# Patient Record
Sex: Female | Born: 1960
Health system: Southern US, Community
[De-identification: ages and names within clinical notes are randomized; demographics above are authoritative.]

## PROBLEM LIST (undated history)

## (undated) DIAGNOSIS — E119 Type 2 diabetes mellitus without complications: Secondary | ICD-10-CM

## (undated) DIAGNOSIS — J189 Pneumonia, unspecified organism: Secondary | ICD-10-CM

## (undated) DIAGNOSIS — D509 Iron deficiency anemia, unspecified: Secondary | ICD-10-CM

## (undated) DIAGNOSIS — G2581 Restless legs syndrome: Secondary | ICD-10-CM

## (undated) DIAGNOSIS — Z87442 Personal history of urinary calculi: Secondary | ICD-10-CM

## (undated) DIAGNOSIS — E039 Hypothyroidism, unspecified: Secondary | ICD-10-CM

## (undated) DIAGNOSIS — Z8719 Personal history of other diseases of the digestive system: Secondary | ICD-10-CM

## (undated) DIAGNOSIS — G4700 Insomnia, unspecified: Secondary | ICD-10-CM

## (undated) DIAGNOSIS — N261 Atrophy of kidney (terminal): Secondary | ICD-10-CM

## (undated) DIAGNOSIS — G4730 Sleep apnea, unspecified: Secondary | ICD-10-CM

## (undated) DIAGNOSIS — M1990 Unspecified osteoarthritis, unspecified site: Secondary | ICD-10-CM

## (undated) DIAGNOSIS — E785 Hyperlipidemia, unspecified: Secondary | ICD-10-CM

## (undated) DIAGNOSIS — D500 Iron deficiency anemia secondary to blood loss (chronic): Secondary | ICD-10-CM

## (undated) HISTORY — PX: MANDIBLE FRACTURE SURGERY: SHX706

## (undated) HISTORY — PX: REFRACTIVE SURGERY: SHX103

## (undated) HISTORY — DX: Iron deficiency anemia, unspecified: D50.9

## (undated) HISTORY — PX: COLONOSCOPY: SHX174

## (undated) HISTORY — DX: Hypothyroidism, unspecified: E03.9

## (undated) HISTORY — DX: Iron deficiency anemia secondary to blood loss (chronic): D50.0

## (undated) HISTORY — DX: Restless legs syndrome: G25.81

## (undated) HISTORY — DX: Atrophy of kidney (terminal): N26.1

## (undated) HISTORY — DX: Insomnia, unspecified: G47.00

## (undated) HISTORY — PX: LASIK: SHX215

## (undated) HISTORY — PX: KIDNEY STONE SURGERY: SHX686

## (undated) HISTORY — DX: Personal history of urinary calculi: Z87.442

## (undated) HISTORY — DX: Hyperlipidemia, unspecified: E78.5

## (undated) HISTORY — PX: ESOPHAGOGASTRODUODENOSCOPY: SHX1529

---

## 1997-12-01 ENCOUNTER — Inpatient Hospital Stay (HOSPITAL_COMMUNITY): Admission: AD | Admit: 1997-12-01 | Discharge: 1997-12-01 | Payer: Self-pay | Admitting: *Deleted

## 1997-12-23 ENCOUNTER — Inpatient Hospital Stay (HOSPITAL_COMMUNITY): Admission: AD | Admit: 1997-12-23 | Discharge: 1997-12-24 | Payer: Self-pay | Admitting: *Deleted

## 1998-02-11 ENCOUNTER — Other Ambulatory Visit: Admission: RE | Admit: 1998-02-11 | Discharge: 1998-02-11 | Payer: Self-pay | Admitting: Obstetrics and Gynecology

## 1999-10-16 HISTORY — PX: TUBAL LIGATION: SHX77

## 1999-11-22 ENCOUNTER — Other Ambulatory Visit: Admission: RE | Admit: 1999-11-22 | Discharge: 1999-11-22 | Payer: Self-pay | Admitting: Obstetrics and Gynecology

## 2000-05-21 ENCOUNTER — Inpatient Hospital Stay (HOSPITAL_COMMUNITY): Admission: AD | Admit: 2000-05-21 | Discharge: 2000-05-23 | Payer: Self-pay | Admitting: Obstetrics and Gynecology

## 2000-05-21 ENCOUNTER — Encounter (INDEPENDENT_AMBULATORY_CARE_PROVIDER_SITE_OTHER): Payer: Self-pay | Admitting: Specialist

## 2000-07-09 ENCOUNTER — Other Ambulatory Visit: Admission: RE | Admit: 2000-07-09 | Discharge: 2000-07-09 | Payer: Self-pay | Admitting: Obstetrics and Gynecology

## 2002-07-20 ENCOUNTER — Other Ambulatory Visit: Admission: RE | Admit: 2002-07-20 | Discharge: 2002-07-20 | Payer: Self-pay | Admitting: Obstetrics and Gynecology

## 2002-10-15 DIAGNOSIS — Z87442 Personal history of urinary calculi: Secondary | ICD-10-CM

## 2002-10-15 HISTORY — DX: Personal history of urinary calculi: Z87.442

## 2002-10-30 ENCOUNTER — Observation Stay (HOSPITAL_COMMUNITY): Admission: AD | Admit: 2002-10-30 | Discharge: 2002-11-01 | Payer: Self-pay | Admitting: Urology

## 2002-10-30 ENCOUNTER — Encounter: Admission: RE | Admit: 2002-10-30 | Discharge: 2002-10-30 | Payer: Self-pay | Admitting: Family Medicine

## 2002-10-30 ENCOUNTER — Encounter: Payer: Self-pay | Admitting: Family Medicine

## 2002-11-10 ENCOUNTER — Ambulatory Visit (HOSPITAL_BASED_OUTPATIENT_CLINIC_OR_DEPARTMENT_OTHER): Admission: RE | Admit: 2002-11-10 | Discharge: 2002-11-10 | Payer: Self-pay | Admitting: Urology

## 2004-09-11 ENCOUNTER — Ambulatory Visit: Payer: Self-pay | Admitting: Internal Medicine

## 2005-07-12 ENCOUNTER — Ambulatory Visit: Payer: Self-pay | Admitting: Internal Medicine

## 2005-08-14 ENCOUNTER — Ambulatory Visit: Payer: Self-pay | Admitting: Internal Medicine

## 2007-07-24 ENCOUNTER — Telehealth: Payer: Self-pay | Admitting: Internal Medicine

## 2007-09-23 ENCOUNTER — Ambulatory Visit: Payer: Self-pay | Admitting: Internal Medicine

## 2007-09-23 DIAGNOSIS — R5381 Other malaise: Secondary | ICD-10-CM | POA: Insufficient documentation

## 2007-09-23 DIAGNOSIS — R5383 Other fatigue: Secondary | ICD-10-CM

## 2007-09-23 DIAGNOSIS — N959 Unspecified menopausal and perimenopausal disorder: Secondary | ICD-10-CM | POA: Insufficient documentation

## 2007-09-23 DIAGNOSIS — E039 Hypothyroidism, unspecified: Secondary | ICD-10-CM | POA: Insufficient documentation

## 2007-09-23 LAB — CONVERTED CEMR LAB
BUN: 14 mg/dL (ref 6–23)
Basophils Absolute: 0 10*3/uL (ref 0.0–0.1)
Basophils Relative: 0.5 % (ref 0.0–1.0)
CO2: 28 meq/L (ref 19–32)
Calcium: 9.4 mg/dL (ref 8.4–10.5)
Chloride: 103 meq/L (ref 96–112)
Creatinine, Ser: 1.2 mg/dL (ref 0.4–1.2)
Eosinophils Absolute: 0 10*3/uL (ref 0.0–0.6)
Eosinophils Relative: 1.1 % (ref 0.0–5.0)
GFR calc Af Amer: 62 mL/min
GFR calc non Af Amer: 52 mL/min
Glucose, Bld: 104 mg/dL — ABNORMAL HIGH (ref 70–99)
HCT: 38 % (ref 36.0–46.0)
Hemoglobin: 13.2 g/dL (ref 12.0–15.0)
Lymphocytes Relative: 30.6 % (ref 12.0–46.0)
MCHC: 34.8 g/dL (ref 30.0–36.0)
MCV: 84.1 fL (ref 78.0–100.0)
Monocytes Absolute: 0.3 10*3/uL (ref 0.2–0.7)
Monocytes Relative: 7.6 % (ref 3.0–11.0)
Neutro Abs: 2.7 10*3/uL (ref 1.4–7.7)
Neutrophils Relative %: 60.2 % (ref 43.0–77.0)
Platelets: 220 10*3/uL (ref 150–400)
Potassium: 4.4 meq/L (ref 3.5–5.1)
RBC: 4.52 M/uL (ref 3.87–5.11)
RDW: 13.3 % (ref 11.5–14.6)
Sodium: 138 meq/L (ref 135–145)
TSH: 4.36 microintl units/mL (ref 0.35–5.50)
WBC: 4.3 10*3/uL — ABNORMAL LOW (ref 4.5–10.5)

## 2007-09-24 ENCOUNTER — Telehealth: Payer: Self-pay | Admitting: Internal Medicine

## 2007-09-24 ENCOUNTER — Encounter: Payer: Self-pay | Admitting: Internal Medicine

## 2007-10-30 ENCOUNTER — Ambulatory Visit: Payer: Self-pay | Admitting: Internal Medicine

## 2007-10-30 LAB — CONVERTED CEMR LAB
ALT: 38 units/L — ABNORMAL HIGH (ref 0–35)
Albumin: 4.2 g/dL (ref 3.5–5.2)
Alkaline Phosphatase: 131 units/L — ABNORMAL HIGH (ref 39–117)
BUN: 12 mg/dL (ref 6–23)
Basophils Absolute: 0 10*3/uL (ref 0.0–0.1)
Blood in Urine, dipstick: NEGATIVE
Calcium: 9.8 mg/dL (ref 8.4–10.5)
Cholesterol: 208 mg/dL (ref 0–200)
Eosinophils Absolute: 0.1 10*3/uL (ref 0.0–0.6)
GFR calc Af Amer: 77 mL/min
GFR calc non Af Amer: 63 mL/min
Glucose, Urine, Semiquant: NEGATIVE
HDL: 44.6 mg/dL (ref >39.0)
Hemoglobin: 14.2 g/dL (ref 12.0–15.0)
Lymphocytes Relative: 23.9 % (ref 12.0–46.0)
MCHC: 34.8 g/dL (ref 30.0–36.0)
Monocytes Absolute: 0.3 10*3/uL (ref 0.2–0.7)
Monocytes Relative: 6.7 % (ref 3.0–11.0)
Neutro Abs: 3.4 10*3/uL (ref 1.4–7.7)
Platelets: 206 10*3/uL (ref 150–400)
Potassium: 4.9 meq/L (ref 3.5–5.1)
Protein, U semiquant: NEGATIVE
Specific Gravity, Urine: 1.02
TSH: 1.72 microintl units/mL (ref 0.35–5.50)
Total CHOL/HDL Ratio: 4.7
Total Protein: 7.6 g/dL (ref 6.0–8.3)
Urobilinogen, UA: 0.2
WBC Urine, dipstick: NEGATIVE
pH: 6.5

## 2007-11-07 ENCOUNTER — Ambulatory Visit: Payer: Self-pay | Admitting: Internal Medicine

## 2007-11-07 ENCOUNTER — Encounter: Payer: Self-pay | Admitting: Internal Medicine

## 2007-11-07 ENCOUNTER — Other Ambulatory Visit: Admission: RE | Admit: 2007-11-07 | Discharge: 2007-11-07 | Payer: Self-pay | Admitting: Internal Medicine

## 2007-11-07 DIAGNOSIS — R945 Abnormal results of liver function studies: Secondary | ICD-10-CM | POA: Insufficient documentation

## 2007-11-07 DIAGNOSIS — G2581 Restless legs syndrome: Secondary | ICD-10-CM

## 2007-11-07 DIAGNOSIS — IMO0001 Reserved for inherently not codable concepts without codable children: Secondary | ICD-10-CM | POA: Insufficient documentation

## 2007-11-07 DIAGNOSIS — E785 Hyperlipidemia, unspecified: Secondary | ICD-10-CM | POA: Insufficient documentation

## 2007-11-07 HISTORY — DX: Restless legs syndrome: G25.81

## 2008-09-15 ENCOUNTER — Telehealth: Payer: Self-pay | Admitting: Internal Medicine

## 2008-10-21 ENCOUNTER — Telehealth (INDEPENDENT_AMBULATORY_CARE_PROVIDER_SITE_OTHER): Payer: Self-pay | Admitting: *Deleted

## 2008-10-21 ENCOUNTER — Ambulatory Visit: Payer: Self-pay | Admitting: Internal Medicine

## 2008-10-21 LAB — CONVERTED CEMR LAB
Bilirubin Urine: NEGATIVE
Blood in Urine, dipstick: NEGATIVE
Glucose, Urine, Semiquant: NEGATIVE
Specific Gravity, Urine: 1.025
WBC Urine, dipstick: NEGATIVE
pH: 6.5

## 2008-10-22 ENCOUNTER — Encounter: Payer: Self-pay | Admitting: Internal Medicine

## 2008-10-22 ENCOUNTER — Telehealth: Payer: Self-pay | Admitting: Internal Medicine

## 2008-10-22 ENCOUNTER — Ambulatory Visit (HOSPITAL_COMMUNITY): Admission: RE | Admit: 2008-10-22 | Discharge: 2008-10-22 | Payer: Self-pay | Admitting: Internal Medicine

## 2008-10-22 DIAGNOSIS — N1330 Unspecified hydronephrosis: Secondary | ICD-10-CM | POA: Insufficient documentation

## 2008-10-27 ENCOUNTER — Encounter: Payer: Self-pay | Admitting: Internal Medicine

## 2009-08-10 ENCOUNTER — Telehealth: Payer: Self-pay | Admitting: Internal Medicine

## 2009-10-27 ENCOUNTER — Encounter (INDEPENDENT_AMBULATORY_CARE_PROVIDER_SITE_OTHER): Payer: Self-pay | Admitting: *Deleted

## 2009-11-15 ENCOUNTER — Ambulatory Visit: Payer: Self-pay | Admitting: Internal Medicine

## 2009-11-15 DIAGNOSIS — E162 Hypoglycemia, unspecified: Secondary | ICD-10-CM | POA: Insufficient documentation

## 2009-11-15 LAB — CONVERTED CEMR LAB
ALT: 23 units/L (ref 0–35)
Chloride: 108 meq/L (ref 96–112)
Creatinine, Ser: 1.1 mg/dL (ref 0.4–1.2)
Free T4: 0.8 ng/dL (ref 0.6–1.6)
Hgb A1c MFr Bld: 5.9 % (ref 4.6–6.5)
Sodium: 142 meq/L (ref 135–145)
T3, Free: 2.5 pg/mL (ref 2.3–4.2)
Total Bilirubin: 0.7 mg/dL (ref 0.3–1.2)
Total Protein: 7.6 g/dL (ref 6.0–8.3)

## 2009-11-21 ENCOUNTER — Telehealth: Payer: Self-pay | Admitting: Internal Medicine

## 2009-11-22 LAB — CONVERTED CEMR LAB: Vit D, 25-Hydroxy: 18 ng/mL — ABNORMAL LOW (ref 30–89)

## 2010-02-15 ENCOUNTER — Ambulatory Visit: Payer: Self-pay | Admitting: Internal Medicine

## 2010-02-15 DIAGNOSIS — E559 Vitamin D deficiency, unspecified: Secondary | ICD-10-CM | POA: Insufficient documentation

## 2010-02-15 LAB — CONVERTED CEMR LAB
ALT: 19 units/L (ref 0–35)
Cholesterol, target level: 200 mg/dL
Direct LDL: 122 mg/dL
HDL: 44.8 mg/dL (ref >39.00)
Total Bilirubin: 0.6 mg/dL (ref 0.3–1.2)
Total Protein: 7 g/dL (ref 6.0–8.3)

## 2010-04-18 ENCOUNTER — Emergency Department (HOSPITAL_COMMUNITY): Admission: EM | Admit: 2010-04-18 | Discharge: 2010-04-19 | Payer: Self-pay | Admitting: Emergency Medicine

## 2010-04-21 ENCOUNTER — Telehealth: Payer: Self-pay | Admitting: Internal Medicine

## 2010-05-17 ENCOUNTER — Ambulatory Visit: Payer: Self-pay | Admitting: Internal Medicine

## 2010-05-17 DIAGNOSIS — R071 Chest pain on breathing: Secondary | ICD-10-CM | POA: Insufficient documentation

## 2010-05-17 DIAGNOSIS — K449 Diaphragmatic hernia without obstruction or gangrene: Secondary | ICD-10-CM | POA: Insufficient documentation

## 2010-05-17 LAB — CONVERTED CEMR LAB
ALT: 21 units/L (ref 0–35)
AST: 24 units/L (ref 0–37)
Albumin: 4 g/dL (ref 3.5–5.2)
BUN: 19 mg/dL (ref 6–23)
Chloride: 106 meq/L (ref 96–112)
Cholesterol: 194 mg/dL (ref 0–200)
Glucose, Bld: 96 mg/dL (ref 70–99)
Potassium: 4.4 meq/L (ref 3.5–5.1)

## 2010-09-14 ENCOUNTER — Ambulatory Visit: Payer: Self-pay | Admitting: Internal Medicine

## 2010-09-14 DIAGNOSIS — J209 Acute bronchitis, unspecified: Secondary | ICD-10-CM | POA: Insufficient documentation

## 2010-11-14 NOTE — Progress Notes (Signed)
Summary: lab results  Phone Note Call from Patient Call back at Work Phone [phone]   Caller: Patient Call For: Stacie Glaze MD Summary of Call: pt needs lab results Initial call taken by: Heron Sabins,  November 21, 2009 11:44 AM  Follow-up for Phone Call        all labs normal except for lwo vitamin d- and dr Lovell Sheehan has already rx her with vitamind 50,000 every week Follow-up by: Willy Eddy, LPN,  November 21, 2009 12:40 PM

## 2010-11-14 NOTE — Letter (Signed)
Summary: Colonoscopy Date Change Letter  [facility]  [address] [place], [place] [address]   Phone: [phone]  Fax: [phone]      October 27, 2009 MRN: [id number]   [facility] [address] [place], [place]  [address]   Dear Ms. Puskarich,   Previously you were recommended to have a repeat colonoscopy around this time. Your chart was recently reviewed by Dr. Claudette Head of [facility]. Follow up colonoscopy is now recommended in December 2012. This revised recommendation is based on current, nationally recognized guidelines for colorectal cancer screening and polyp surveillance. These guidelines are endorsed by the American Cancer Society, [employer] on Colorectal Cancer as well as numerous other major medical organizations.  Please understand that our recommendation assumes that you do not have any new symptoms such as bleeding, a change in bowel habits, anemia, or significant abdominal discomfort. If you do have any concerning GI symptoms or want to discuss the guideline recommendations, please call to arrange an office visit at your earliest convenience. Otherwise we will keep you in our reminder system and contact you 1-2 months prior to the date listed above to schedule your next colonoscopy.  Thank you,  Judie Petit T. Russella Dar, M.D.  [facility] [phone]

## 2010-11-14 NOTE — Assessment & Plan Note (Signed)
Summary: 3 MO ROV/MM   Vital Signs:  Patient profile:   50 year old female Height:      66 inches Weight:      229 pounds BMI:     37.10 Temp:     98.2 degrees F oral Pulse rate:   76 / minute Resp:     14 per minute BP sitting:   130 / 80  (left arm) Cuff size:   large  Vitals Entered By: Willy Eddy, LPN (Feb 15, 1190 10:03 AM) CC: roa- no vitamind d level since february-requesting refill, Lipid Management   CC:  roa- no vitamind d level since february-requesting refill and Lipid Management.  History of Present Illness: reveiwed the vit d and the thyroid as well as the DM screening labs fatigue is improved and the pt notes that he vitamin d  has improved he fatigue she has not lost weight asn has not loked at the dash diet  Lipid Management History:      Negative NCEP/ATP III risk factors include female age less than 14 years old and non-tobacco-user status.     Preventive Screening-Counseling & Management  Alcohol-Tobacco     Smoking Status: never  Problems Prior to Update: 1)  Hypoglycemia  (ICD-251.2) 2)  Hydronephrosis, Right  (ICD-591) 3)  Hyperlipidemia  (ICD-272.4) 4)  Liver Function Tests, Abnormal  (ICD-794.8) 5)  Myofascial Pain Syndrome  (ICD-729.1) 6)  Restless Leg Syndrome, Mild  (ICD-333.94) 7)  Physical Examination  (ICD-V70.0) 8)  Disorder, Menopausal Nos  (ICD-627.9) 9)  Malaise and Fatigue  (ICD-780.79) 10)  Nephrectomy, Hx of  (ICD-V45.73) 11)  Hypothyroidism  (ICD-244.9) 12)  Family History Diabetes 1st Degree Relative  (ICD-V18.0)  Current Problems (verified): 1)  Hypoglycemia  (ICD-251.2) 2)  Hydronephrosis, Right  (ICD-591) 3)  Hyperlipidemia  (ICD-272.4) 4)  Liver Function Tests, Abnormal  (ICD-794.8) 5)  Myofascial Pain Syndrome  (ICD-729.1) 6)  Restless Leg Syndrome, Mild  (ICD-333.94) 7)  Physical Examination  (ICD-V70.0) 8)  Disorder, Menopausal Nos  (ICD-627.9) 9)  Malaise and Fatigue  (ICD-780.79) 10)  Nephrectomy, Hx of   (ICD-V45.73) 11)  Hypothyroidism  (ICD-244.9) 12)  Family History Diabetes 1st Degree Relative  (ICD-V18.0)  Medications Prior to Update: 1)  Levothroid 112 Mcg  Tabs (Levothyroxine Sodium) .... One By Mouth Daily 2)  Vitamin D (Ergocalciferol) 50000 Unit Caps (Ergocalciferol) .... One By Mouth Weekly  Current Medications (verified): 1)  Levothroid 112 Mcg  Tabs (Levothyroxine Sodium) .... One By Mouth Daily 2)  Vitamin D (Ergocalciferol) 50000 Unit Caps (Ergocalciferol) .... One By Mouth Weekly  Allergies (verified): No Known Drug Allergies  Past History:  Family History: Last updated: 04/30/2007 Family History Diabetes 1st degree relative Family History Hypertension Fam hx renal failure  Social History: Last updated: 04/30/2007 Never Smoked  Risk Factors: Smoking Status: never (02/15/2010)  Past medical, surgical, family and social histories (including risk factors) reviewed, and no changes noted (except as noted below).  Past Medical History: Reviewed history from 11/07/2007 and no changes required. Hypothyroidism insomnia Hyperlipidemia  Past Surgical History: Reviewed history from 09/23/2007 and no changes required. Nephrectomy  Family History: Reviewed history from 04/30/2007 and no changes required. Family History Diabetes 1st degree relative Family History Hypertension Fam hx renal failure  Social History: Reviewed history from 04/30/2007 and no changes required. Never Smoked  Review of Systems       The patient complains of weight gain.  The patient denies anorexia, fever, weight loss, vision loss,  decreased hearing, hoarseness, chest pain, syncope, dyspnea on exertion, peripheral edema, prolonged cough, headaches, hemoptysis, abdominal pain, melena, hematochezia, severe indigestion/heartburn, hematuria, incontinence, genital sores, muscle weakness, suspicious skin lesions, transient blindness, difficulty walking, depression, unusual weight change,  abnormal bleeding, enlarged lymph nodes, angioedema, and breast masses.    Physical Exam  General:  alert, well-developed, and overweight-appearing.   Eyes:  pupils equal and pupils round.   Ears:  R ear normal and L ear normal.   Nose:  no external deformity and no nasal discharge.   Neck:  No deformities, masses, or tenderness noted. Lungs:  normal respiratory effort and no intercostal retractions.   Heart:  normal rate and regular rhythm.   Abdomen:  soft and non-tender.     Impression & Recommendations:  Problem # 1:  HYPOTHYROIDISM (ICD-244.9)  Her updated medication list for this problem includes:    Levothroid 112 Mcg Tabs (Levothyroxine sodium) ..... One by mouth daily  Labs Reviewed: TSH: 1.69 (11/15/2009)    HgBA1c: 5.9 (11/15/2009) Chol: 208 (10/30/2007)   HDL: 44.6 (10/30/2007)   LDL: DEL (10/30/2007)   TG: 106 (10/30/2007)  Orders: Venipuncture ([address]403)  Problem # 2:  UNSPECIFIED VITAMIN D DEFICIENCY (ICD-268.9)  last level was 18 and started the 50,000 iu so if the level has risen appro. will  dose 2,000 international units daily if not then keep on the 50,000 for an additional 3 months Orders: Venipuncture (47425) T-Vitamin D (25-Hydroxy) (95638-75643)  Problem # 3:  HYPOGLYCEMIA (ICD-251.2) monitering prediabetes  Problem # 4:  LIVER FUNCTION TESTS, ABNORMAL (ICD-794.8) monitering with the vit d Orders: TLB-Hepatic/Liver Function Pnl (80076-HEPATIC)  Advised patient to avoid alcohol and Tylenol. Call for worsening symptoms.   Problem # 5:  HYPERLIPIDEMIA, BORDERLINE (ICD-272.4)  Labs Reviewed: SGOT: 22 (11/15/2009)   SGPT: 23 (11/15/2009)  Lipid Goals: Chol Goal: 200 (02/15/2010)   HDL Goal: 40 (02/15/2010)   LDL Goal: 160 (02/15/2010)   TG Goal: 150 (02/15/2010)  10 Yr Risk Heart Disease: Not enough information   HDL:44.6 (10/30/2007)  LDL:DEL (10/30/2007)  Chol:208 (10/30/2007)  Trig:106 (10/30/2007)  Orders: TLB-Cholesterol, HDL  (83718-HDL) TLB-Cholesterol, Direct LDL (83721-DIRLDL) TLB-Cholesterol, Total (82465-CHO)  Complete Medication List: 1)  Levothroid 112 Mcg Tabs (Levothyroxine sodium) .... One by mouth daily 2)  Vitamin D (ergocalciferol) 50000 Unit Caps (Ergocalciferol) .... One by mouth weekly  Lipid Assessment/Plan:      Based on NCEP/ATP III, the patient's risk factor category is "0-1 risk factors".  The patient's lipid goals are as follows: Total cholesterol goal is 200; LDL cholesterol goal is 160; HDL cholesterol goal is 40; Triglyceride goal is 150.  Her LDL cholesterol goal has been met.    Patient Instructions: 1)  Please schedule a follow-up appointment in 3 months. 2)  look at the dash diet Prescriptions: LEVOTHROID 112 MCG  TABS (LEVOTHYROXINE SODIUM) one by mouth daily  #30 Each x 6   Entered and Authorized by:   Stacie Glaze MD   Signed by:   Stacie Glaze MD on 02/15/2010   Method used:   Electronically to        [facility] (retail)       [address]       [place], [place]  [address]       Ph: [phone]       Fax: [phone]   RxID:   [id number]

## 2010-11-14 NOTE — Assessment & Plan Note (Signed)
Summary: tiredness-will fast-check thyroid//ccm   Vital Signs:  Patient profile:   50 year old female Height:      66 inches Weight:      229 pounds BMI:     37.10 Temp:     98.2 degrees F oral Pulse rate:   72 / minute Resp:     14 per minute BP sitting:   124 / 84  (left arm)  Vitals Entered By: Willy Eddy, LPN (November 15, 2009 10:18 AM) CC: c/o fatigue and sleepiness(falling asllep easily) wants thyroid checked--c/o restless leg- 50,000 units vitamin d helped that but she is out of that now Comments pt is out of vitamind 50,00 units but would like to start back- it helped restless leg   CC:  c/o fatigue and sleepiness(falling asllep easily) wants thyroid checked--c/o restless leg- 50 and 000 units vitamin d helped that but she is out of that now.  History of Present Illness: Feels run down has a hx of DM in family ( mother and father) and has a hx of hypothyroidism  Preventive Screening-Counseling & Management  Alcohol-Tobacco     Smoking Status: never  Problems Prior to Update: 1)  Hydronephrosis, Right  (ICD-591) 2)  Hyperlipidemia  (ICD-272.4) 3)  Liver Function Tests, Abnormal  (ICD-794.8) 4)  Myofascial Pain Syndrome  (ICD-729.1) 5)  Restless Leg Syndrome, Mild  (ICD-333.94) 6)  Physical Examination  (ICD-V70.0) 7)  Disorder, Menopausal Nos  (ICD-627.9) 8)  Malaise and Fatigue  (ICD-780.79) 9)  Nephrectomy, Hx of  (ICD-V45.73) 10)  Hypothyroidism  (ICD-244.9) 11)  Family History Diabetes 1st Degree Relative  (ICD-V18.0)  Current Problems (verified): 1)  Hydronephrosis, Right  (ICD-591) 2)  Hyperlipidemia  (ICD-272.4) 3)  Liver Function Tests, Abnormal  (ICD-794.8) 4)  Myofascial Pain Syndrome  (ICD-729.1) 5)  Restless Leg Syndrome, Mild  (ICD-333.94) 6)  Physical Examination  (ICD-V70.0) 7)  Disorder, Menopausal Nos  (ICD-627.9) 8)  Malaise and Fatigue  (ICD-780.79) 9)  Nephrectomy, Hx of  (ICD-V45.73) 10)  Hypothyroidism  (ICD-244.9) 11)   Family History Diabetes 1st Degree Relative  (ICD-V18.0)  Medications Prior to Update: 1)  Levothroid 112 Mcg  Tabs (Levothyroxine Sodium) .... One By Mouth Daily 2)  Vitamin D 09811 Unit  Caps (Ergocalciferol) .... One By Mouth Weekly 3)  Mirapex 0.5 Mg  Tabs (Pramipexole Dihydrochloride) .... One By Mouth At Bedtime 4)  Zithromax Z-Pak 250 Mg Tabs (Azithromycin) .... Use As Directed 5)  Atuss Ds 30-4-30 Mg/25ml Susp (Pseudoephed Hcl-Cpm-Dm Hbr Tan) .... 2 Tsp Every 12 Hours 6)  Cipro 500 Mg Tabs (Ciprofloxacin Hcl) .... One By Mouth Daily  Current Medications (verified): 1)  Levothroid 112 Mcg  Tabs (Levothyroxine Sodium) .... One By Mouth Daily 2)  Vitamin D (Ergocalciferol) 50000 Unit Caps (Ergocalciferol) .... One By Mouth Weekly  Allergies (verified): No Known Drug Allergies  Past History:  Family History: Last updated: 04/30/2007 Family History Diabetes 1st degree relative Family History Hypertension Fam hx renal failure  Social History: Last updated: 04/30/2007 Never Smoked  Risk Factors: Smoking Status: never (11/15/2009)  Past medical, surgical, family and social histories (including risk factors) reviewed, and no changes noted (except as noted below).  Past Medical History: Reviewed history from 11/07/2007 and no changes required. Hypothyroidism insomnia Hyperlipidemia  Past Surgical History: Reviewed history from 09/23/2007 and no changes required. Nephrectomy  Family History: Reviewed history from 04/30/2007 and no changes required. Family History Diabetes 1st degree relative Family History Hypertension Fam hx renal failure  Social History: Reviewed history from 04/30/2007 and no changes required. Never Smoked  Review of Systems  The patient denies anorexia, fever, weight loss, weight gain, vision loss, decreased hearing, hoarseness, chest pain, syncope, dyspnea on exertion, peripheral edema, prolonged cough, headaches, hemoptysis, abdominal pain,  melena, hematochezia, severe indigestion/heartburn, hematuria, incontinence, genital sores, muscle weakness, suspicious skin lesions, transient blindness, difficulty walking, depression, unusual weight change, abnormal bleeding, enlarged lymph nodes, angioedema, and breast masses.         weakness  Physical Exam  General:  alert, well-developed, and overweight-appearing.   Head:  normocephalic and atraumatic.   Eyes:  pupils equal and pupils round.   Ears:  R ear normal and L ear normal.   Nose:  no external deformity and no nasal discharge.   Neck:  No deformities, masses, or tenderness noted. Lungs:  normal respiratory effort and no intercostal retractions.   Heart:  normal rate and regular rhythm.   Abdomen:  soft and non-tender.   Psych:  Oriented X3 and good eye contact.     Impression & Recommendations:  Problem # 1:  HYPOGLYCEMIA (ICD-251.2)  Orders: Venipuncture (08657) TLB-BMP (Basic Metabolic Panel-BMET) (80048-METABOL) TLB-A1C / Hgb A1C (Glycohemoglobin) (83036-A1C)  Problem # 2:  LIVER FUNCTION TESTS, ABNORMAL (ICD-794.8) fatty liver Orders: TLB-Hepatic/Liver Function Pnl (80076-HEPATIC) weight loss is the key  Problem # 3:  HYPOTHYROIDISM (ICD-244.9) moniering of labs due to increased fatigue Her updated medication list for this problem includes:    Levothroid 112 Mcg Tabs (Levothyroxine sodium) ..... One by mouth daily  Orders: TLB-T4 (Thyrox), Free [phone]) TLB-T3, Free (Triiodothyronine) (84481-T3FREE) TLB-TSH (Thyroid Stimulating Hormone) [phone])  Labs Reviewed: TSH: 1.72 (10/30/2007)    Chol: 208 (10/30/2007)   HDL: 44.6 (10/30/2007)   LDL: DEL (10/30/2007)   TG: 106 (10/30/2007)  Problem # 4:  RESTLESS LEG SYNDROME, MILD (ICD-333.94)  Orders: T-Vitamin D (25-Hydroxy) ([address])  Complete Medication List: 1)  Levothroid 112 Mcg Tabs (Levothyroxine sodium) .... One by mouth daily 2)  Vitamin D (ergocalciferol) 50000 Unit Caps  (Ergocalciferol) .... One by mouth weekly  Patient Instructions: 1)  www.[email or web address].org 2)  complex carbohydrates and lean protein source 3)  snaks 4)  10 almonds, 1 inch square of cheeze 5)  apple wedges dipped in peanut butter ( two wedges) 6)  two peanut butter crarckers 7)  Please schedule a follow-up appointment in 3 months. Prescriptions: VITAMIN D (ERGOCALCIFEROL) 50000 UNIT CAPS (ERGOCALCIFEROL) one by mouth weekly  #5 x 5   Entered and Authorized by:   Stacie Glaze MD   Signed by:   Stacie Glaze MD on 11/15/2009   Method used:   Electronically to        [facility] (retail)       [address]       [place], [place]  [address]       Ph: [phone]       Fax: [phone]   RxID:   [id number]

## 2010-11-14 NOTE — Progress Notes (Signed)
Summary: Pt req lab results from vit d and also had xrays and CT at [facility] ER  Phone Note Call from Patient Call back at Work Phone [phone]   Caller: Patient Summary of Call: Pt called and went to [facility] ER on Tuesday night because pt fell last week,on 04/13/10. Pt had xrays done and CT Scan. Pt is req that Dr. Lovell Sheehan review those asap and advise pt.   Also pt is req lab results from vit d.    Initial call taken by: Lucy Antigua,  April 21, 2010 8:51 AM    Prescriptions: VITAMIN D (ERGOCALCIFEROL) 50000 UNIT CAPS (ERGOCALCIFEROL) one by mouth weekly  #5 x 5   Entered by:   Willy Eddy, LPN   Authorized by:   Stacie Glaze MD   Signed by:   Willy Eddy, LPN on 60/73/7106   Method used:   Electronically to        [facility] (retail)       [address]       [place], [place]  [address]       Ph: [phone]       Fax: [phone]   RxID:   [id number]   Appended Document: Pt req lab results from vit d and also had xrays and CT at [facility] ER ct fromo er was normal ptinformed and to continue vitamind 50000 weekly

## 2010-11-14 NOTE — Assessment & Plan Note (Signed)
Summary: 4 month rov/njr   Vital Signs:  Patient profile:   50 year old female Height:      66 inches Weight:      228 pounds BMI:     36.93 Temp:     98.2 degrees F oral Pulse rate:   76 / minute Resp:     14 per minute BP sitting:   136 / 80  (left arm)  Vitals Entered By: Willy Eddy, LPN (September 14, 2010 10:10 AM) CC: roa- c/o croupy cough for  2 weeks, Cough Is Patient Diabetic? No   Primary Care Provider:  Stacie Glaze MD  CC:  roa- c/o croupy cough for  2 weeks and Cough.  History of Present Illness: two weeks of URI with cough and conjestion  Cough      This is a 50 year old woman who presents with Cough.  The patient reports productive cough, shortness of breath, and wheezing, but denies fever.  Associated symtpoms include cold/URI symptoms and chronic rhinitis.  The cough is worse with activity.    Preventive Screening-Counseling & Management  Alcohol-Tobacco     Smoking Status: never  Current Problems (verified): 1)  Hiatal Hernia With Reflux  (ICD-553.3) 2)  Chest Wall Pain, Anterior  (ICD-786.52) 3)  Hyperlipidemia, Borderline  (ICD-272.4) 4)  Unspecified Vitamin D Deficiency  (ICD-268.9) 5)  Hypoglycemia  (ICD-251.2) 6)  Hydronephrosis, Right  (ICD-591) 7)  Hyperlipidemia  (ICD-272.4) 8)  Liver Function Tests, Abnormal  (ICD-794.8) 9)  Myofascial Pain Syndrome  (ICD-729.1) 10)  Restless Leg Syndrome, Mild  (ICD-333.94) 11)  Physical Examination  (ICD-V70.0) 12)  Disorder, Menopausal Nos  (ICD-627.9) 13)  Malaise and Fatigue  (ICD-780.79) 14)  Hypothyroidism  (ICD-244.9) 15)  Family History Diabetes 1st Degree Relative  (ICD-V18.0)  Current Medications (verified): 1)  Levothroid 112 Mcg  Tabs (Levothyroxine Sodium) .... One By Mouth Daily 2)  Vitamin D (Ergocalciferol) 50000 Unit Caps (Ergocalciferol) .... One By Mouth Weekly  Allergies (verified): No Known Drug Allergies  Past History:  Family History: Last updated:  04/30/2007 Family History Diabetes 1st degree relative Family History Hypertension Fam hx renal failure  Social History: Last updated: 04/30/2007 Never Smoked  Risk Factors: Smoking Status: never (09/14/2010)  Past medical, surgical, family and social histories (including risk factors) reviewed, and no changes noted (except as noted below). Past medical, surgical, family and social histories (including risk factors) reviewed for relevance to current acute and chronic problems.  Past Medical History: Reviewed history from 05/17/2010 and no changes required. Hypothyroidism insomnia Hyperlipidemia atrophic left kidney  Past Surgical History: Reviewed history from 09/23/2007 and no changes required. Nephrectomy  Family History: Reviewed history from 04/30/2007 and no changes required. Family History Diabetes 1st degree relative Family History Hypertension Fam hx renal failure  Social History: Reviewed history from 04/30/2007 and no changes required. Never Smoked  Review of Systems       The patient complains of decreased hearing, dyspnea on exertion, prolonged cough, and headaches.  The patient denies anorexia, fever, weight loss, weight gain, vision loss, hoarseness, chest pain, syncope, peripheral edema, hemoptysis, abdominal pain, melena, hematochezia, severe indigestion/heartburn, hematuria, incontinence, genital sores, muscle weakness, suspicious skin lesions, transient blindness, difficulty walking, depression, unusual weight change, abnormal bleeding, enlarged lymph nodes, angioedema, and breast masses.    Physical Exam  General:  alert, well-developed, and overweight-appearing.   Head:  normocephalic and atraumatic.   Eyes:  pupils equal and pupils round.   Ears:  R  ear normal and L ear normal.   Nose:  mucosal erythema and mucosal edema.   Mouth:  pharyngeal erythema and postnasal drip.   Neck:  No deformities, masses, or tenderness noted. Lungs:  normal  respiratory effort and no intercostal retractions.  R wheezes.   Heart:  normal rate and regular rhythm.   Abdomen:  soft and non-tender.     Impression & Recommendations:  Problem # 1:  ACUTE BRONCHITIS (ICD-466.0)  symptoms for 2 weeks  Take antibiotics and other medications as directed. Encouraged to push clear liquids, get enough rest, and take acetaminophen as needed. To be seen in 5-7 days if no improvement, sooner if worse.  Her updated medication list for this problem includes:    Clarithromycin 500 Mg Xr24h-tab (Clarithromycin) ..... One by mouth two times a day for 7 days    Zutripro 60-4-5 Mg/41ml Soln (Pseudoeph-chlorphen-hydrocod) ..... One tsp by mouth q 6 hours  Problem # 2:  HYPERLIPIDEMIA, BORDERLINE (ICD-272.4)  Labs Reviewed: SGOT: 24 (05/17/2010)   SGPT: 21 (05/17/2010)  Lipid Goals: Chol Goal: 200 (02/15/2010)   HDL Goal: 40 (02/15/2010)   LDL Goal: 160 (02/15/2010)   TG Goal: 150 (02/15/2010)  Prior 10 Yr Risk Heart Disease: Not enough information (02/15/2010)   HDL:44.30 (05/17/2010), 44.80 (02/15/2010)  LDL:132 (05/17/2010), DEL (10/30/2007)  Chol:194 (05/17/2010), 178 (02/15/2010)  Trig:88.0 (05/17/2010), 106 (10/30/2007)  Problem # 3:  HYPOTHYROIDISM (ICD-244.9)  Her updated medication list for this problem includes:    Levothroid 112 Mcg Tabs (Levothyroxine sodium) ..... One by mouth daily  Labs Reviewed: TSH: 1.11 (05/17/2010)    HgBA1c: 5.9 (11/15/2009) Chol: 194 (05/17/2010)   HDL: 44.30 (05/17/2010)   LDL: 132 (05/17/2010)   TG: 88.0 (05/17/2010)  Complete Medication List: 1)  Levothroid 112 Mcg Tabs (Levothyroxine sodium) .... One by mouth daily 2)  Vitamin D (ergocalciferol) 50000 Unit Caps (Ergocalciferol) .... One by mouth weekly 3)  Clarithromycin 500 Mg Xr24h-tab (Clarithromycin) .... One by mouth two times a day for 7 days 4)  Zutripro 60-4-5 Mg/69ml Soln (Pseudoeph-chlorphen-hydrocod) .... One tsp by mouth q 6 hours  Patient  Instructions: 1)  Please schedule a follow-up appointment in 6 months.  CPX Prescriptions: ZUTRIPRO 60-4-5 MG/5ML SOLN (PSEUDOEPH-CHLORPHEN-HYDROCOD) one tsp by mouth q 6 hours  #6 oz x 0   Entered and Authorized by:   Stacie Glaze MD   Signed by:   Stacie Glaze MD on 09/14/2010   Method used:   Print then Give to Patient   RxID:   [id number] CLARITHROMYCIN 500 MG XR24H-TAB (CLARITHROMYCIN) one by mouth two times a day for 7 days  #14 x 0   Entered and Authorized by:   Stacie Glaze MD   Signed by:   Stacie Glaze MD on 09/14/2010   Method used:   Electronically to        [facility] (retail)       [address]       [place], [place]  [address]       Ph: [phone]       Fax: [phone]   RxID:   [id number]    Orders Added: 1)  Est. Patient Level IV [[address]]

## 2010-11-14 NOTE — Assessment & Plan Note (Signed)
Summary: 3 month rov/njr   Vital Signs:  Patient profile:   50 year old female Height:      66 inches Weight:      228 pounds BMI:     36.93 Temp:     97.7 degrees F oral Pulse rate:   75 / minute BP sitting:   106 / 74  (left arm) Cuff size:   regular  Vitals Entered By: Kathrynn Speed CMA (May 17, 2010 9:53 AM) CC: 3 mth rov, hospital fu, maybe bruised chest wall on right side,src   CC:  3 mth rov, hospital fu, maybe bruised chest wall on right side, and src.  History of Present Illness: Pt had a fall in early july with a fall on knee and hit chest, the urgent care directed her to the ER she had a CT at [facility] which confirmed the diagnosis of chest wall trauma she has persistant  pain in the ribs weight loss and obesity discussed  Problems Prior to Update: 1)  Hyperlipidemia, Borderline  (ICD-272.4) 2)  Unspecified Vitamin D Deficiency  (ICD-268.9) 3)  Hypoglycemia  (ICD-251.2) 4)  Hydronephrosis, Right  (ICD-591) 5)  Hyperlipidemia  (ICD-272.4) 6)  Liver Function Tests, Abnormal  (ICD-794.8) 7)  Myofascial Pain Syndrome  (ICD-729.1) 8)  Restless Leg Syndrome, Mild  (ICD-333.94) 9)  Physical Examination  (ICD-V70.0) 10)  Disorder, Menopausal Nos  (ICD-627.9) 11)  Malaise and Fatigue  (ICD-780.79) 12)  Nephrectomy, Hx of  (ICD-V45.73) 13)  Hypothyroidism  (ICD-244.9) 14)  Family History Diabetes 1st Degree Relative  (ICD-V18.0)  Medications Prior to Update: 1)  Levothroid 112 Mcg  Tabs (Levothyroxine Sodium) .... One By Mouth Daily 2)  Vitamin D (Ergocalciferol) 50000 Unit Caps (Ergocalciferol) .... One By Mouth Weekly  Current Medications (verified): 1)  Levothroid 112 Mcg  Tabs (Levothyroxine Sodium) .... One By Mouth Daily 2)  Vitamin D (Ergocalciferol) 50000 Unit Caps (Ergocalciferol) .... One By Mouth Weekly  Allergies (verified): No Known Drug Allergies  Past History:  Family History: Last updated: 04/30/2007 Family History Diabetes 1st degree  relative Family History Hypertension Fam hx renal failure  Social History: Last updated: 04/30/2007 Never Smoked  Risk Factors: Smoking Status: never (02/15/2010)  Past medical, surgical, family and social histories (including risk factors) reviewed, and no changes noted (except as noted below).  Past Medical History: Hypothyroidism insomnia Hyperlipidemia atrophic left kidney  Past Surgical History: Reviewed history from 09/23/2007 and no changes required. Nephrectomy  Family History: Reviewed history from 04/30/2007 and no changes required. Family History Diabetes 1st degree relative Family History Hypertension Fam hx renal failure  Social History: Reviewed history from 04/30/2007 and no changes required. Never Smoked  Review of Systems  The patient denies anorexia, fever, weight loss, weight gain, vision loss, decreased hearing, hoarseness, chest pain, syncope, dyspnea on exertion, peripheral edema, prolonged cough, headaches, hemoptysis, abdominal pain, melena, hematochezia, severe indigestion/heartburn, hematuria, incontinence, genital sores, muscle weakness, suspicious skin lesions, transient blindness, difficulty walking, depression, unusual weight change, abnormal bleeding, enlarged lymph nodes, angioedema, breast masses, and testicular masses.         chest walll pain on the right  Physical Exam  General:  alert, well-developed, and overweight-appearing.   Eyes:  pupils equal and pupils round.   Ears:  R ear normal and L ear normal.   Nose:  no external deformity and no nasal discharge.   Neck:  No deformities, masses, or tenderness noted. Chest Wall:  costochondrial tenderness and chest wall mass.  Lungs:  normal respiratory effort and no intercostal retractions.   Heart:  normal rate and regular rhythm.   Abdomen:  soft and non-tender.   Neurologic:  alert & oriented X3 and cranial nerves II-XII intact.     Impression & Recommendations:  Problem # 1:   CHEST WALL PAIN, ANTERIOR (ICD-786.52)  chest wall pain for trauma resolving er recored and ct scans reviewed with pt  Reviewed EKG (see interpretation) and treatment options. Patient instructed to call for worsening pain, or new symptoms.   Problem # 2:  LIVER FUNCTION TESTS, ABNORMAL (ICD-794.8)  monitering today fatty liver working diagnosis  Advised patient to avoid alcohol and Tylenol. Call for worsening symptoms.   Orders: TLB-Hepatic/Liver Function Pnl (80076-HEPATIC)  Problem # 3:  HIATAL HERNIA WITH REFLUX (ICD-553.3)  confirmed on CT  Labs Reviewed: Hgb: 14.2 (10/30/2007)   Hct: 40.8 (10/30/2007)  Problem # 4:  HYPOTHYROIDISM (ICD-244.9)  Her updated medication list for this problem includes:    Levothroid 112 Mcg Tabs (Levothyroxine sodium) ..... One by mouth daily  Labs Reviewed: TSH: 1.69 (11/15/2009)    HgBA1c: 5.9 (11/15/2009) Chol: 178 (02/15/2010)   HDL: 44.80 (02/15/2010)   LDL: DEL (10/30/2007)   TG: 106 (10/30/2007)  Orders: Venipuncture (03474) TLB-TSH (Thyroid Stimulating Hormone) (84443-TSH)  Complete Medication List: 1)  Levothroid 112 Mcg Tabs (Levothyroxine sodium) .... One by mouth daily 2)  Vitamin D (ergocalciferol) 50000 Unit Caps (Ergocalciferol) .... One by mouth weekly  Other Orders: TLB-Lipid Panel (80061-LIPID) TLB-BMP (Basic Metabolic Panel-BMET) (80048-METABOL)  Patient Instructions: 1)  Please schedule a follow-up appointment in 4 months.  Appended Document: Orders Update    Clinical Lists Changes  Orders: Added new Service order of Specimen Handling ([id number]) - Signed

## 2010-12-12 ENCOUNTER — Other Ambulatory Visit: Payer: Self-pay | Admitting: Internal Medicine

## 2010-12-31 LAB — DIFFERENTIAL
Basophils Absolute: 0 10*3/uL (ref 0.0–0.1)
Eosinophils Relative: 4 % (ref 0–5)
Lymphocytes Relative: 37 % (ref 12–46)
Monocytes Absolute: 0.4 10*3/uL (ref 0.1–1.0)
Monocytes Relative: 7 % (ref 3–12)
Neutro Abs: 3.3 10*3/uL (ref 1.7–7.7)

## 2010-12-31 LAB — COMPREHENSIVE METABOLIC PANEL
AST: 28 U/L (ref 0–37)
Albumin: 3.9 g/dL (ref 3.5–5.2)
Chloride: 106 mEq/L (ref 96–112)
Creatinine, Ser: 1 mg/dL (ref 0.4–1.2)
GFR calc Af Amer: >60 mL/min (ref >60)
Potassium: 4.2 mEq/L (ref 3.5–5.1)
Total Bilirubin: 0.7 mg/dL (ref 0.3–1.2)
Total Protein: 7.1 g/dL (ref 6.0–8.3)

## 2010-12-31 LAB — URINALYSIS, ROUTINE W REFLEX MICROSCOPIC
Nitrite: NEGATIVE
Protein, ur: NEGATIVE mg/dL
Specific Gravity, Urine: 1.016 (ref 1.005–1.030)
Urobilinogen, UA: 0.2 mg/dL (ref 0.0–1.0)

## 2010-12-31 LAB — CBC
MCH: 29.9 pg (ref 26.0–34.0)
Platelets: 179 10*3/uL (ref 150–400)
RBC: 4.32 MIL/uL (ref 3.87–5.11)
RDW: 13.6 % (ref 11.5–15.5)
WBC: 6.3 10*3/uL (ref 4.0–10.5)

## 2010-12-31 LAB — PROTIME-INR: Prothrombin Time: 13.3 seconds (ref 11.6–15.2)

## 2011-02-13 ENCOUNTER — Other Ambulatory Visit: Payer: Self-pay | Admitting: Internal Medicine

## 2011-03-02 NOTE — Op Note (Signed)
NAME:  Angelica Gray, Angelica Gray                           ACCOUNT NO.:  [id number]   MEDICAL RECORD NO.:  [id number]                   PATIENT TYPE:  AMB   LOCATION:  [facility]                                 FACILITY:  [facility]   PHYSICIAN:  Ronald L. Ovidio Hanger, M.D.           DATE OF BIRTH:  May 09, 1961   DATE OF PROCEDURE:  11/10/2002  DATE OF DISCHARGE:                                 OPERATIVE REPORT   PREOPERATIVE DIAGNOSES:  1. Right ureterolithiasis with urosepsis.  2. Placement of prior stent.   PROCEDURES:  1. Cystourethroscopy.  2. Removal of right double J stent.  3. Right ureteroscopy with holmium laser lithotripsy and stone basket     extraction.   SURGEON:  Lucrezia Starch. Earlene Plater, M.D.   ANESTHESIA:  General laryngeal airway.   ESTIMATED BLOOD LOSS:  Negligible.   TUBES:  None.   COMPLICATIONS:  None.   INDICATION FOR PROCEDURE:  The patient is a  lovely 50 year old white female  who originally presented with urosepsis and obstructive right  ureterolithiasis.  She underwent placement of a right double J stent and has  subsequently recovered from her urosepsis and is now for cystoscopy and  ureteroscopy and stone removal.  She understands the risks, benefits, and  alternatives and wishes to proceed.  She has been properly preoperatively  prepared.   DESCRIPTION OF PROCEDURE:  The patient was placed in the supine position,  after proper general laryngeal airway anesthesia was placed in the dorsal  lithotomy position, prepped and draped with Betadine in sterile fashion.  Cystourethroscopy was performed with a 22.5 French Olympus panendoscope  utilizing the 12 and 70 degree lenses.  The bladder was carefully inspected  and noted to be without lesions, and the right double J stent could be  visualized with the 12 degree lens.  It was grasped with a grasping forceps  and extracted to the urethral meatus and a 0.038 French Teflon-coated  guidewire was placed into the right renal pelvis  and the stent was removed.  A short, thin ACMI ureteroscope was then utilized, semirigid, and  ureteroscopy was easily performed up to the level of the stone.  It was in  the lower ureter but flushed to the mid-ureter.  It was quite large, and it  was felt that holmium laser lithotripsy was indicated.  Utilizing a 365  fiber, a setting of 0.5 and a repetition rate of 5, the stone was fragmented  into several fragments and these were extracted with a nitinol basket and  submitted to pathology.  Reinspection of  the lower two-thirds ureter revealed only stone debris and no frank  fragments.  It was felt that it was widely patent and no further stent would  be required.  The bladder was drained and all scopes were removed, and the  patient was taken to the recovery room stable.  Ronald L. Ovidio Hanger, M.D.    RLD/MEDQ  D:  11/10/2002  T:  11/10/2002  Job:  [id number]

## 2011-03-02 NOTE — Op Note (Signed)
NAME:  Angelica Gray, Angelica Gray                           ACCOUNT NO.:  [id number]   MEDICAL RECORD NO.:  [id number]                   PATIENT TYPE:  AMB   LOCATION:  DAY                                  FACILITY:  [facility]   PHYSICIAN:  Ronald L. Ovidio Hanger, M.D.           DATE OF BIRTH:  18-Nov-1960   DATE OF PROCEDURE:  10/30/2002  DATE OF DISCHARGE:                                 OPERATIVE REPORT   PREOPERATIVE DIAGNOSES:  1. Solitary functioning right renal unit.  2. Right distal ureterolithiasis with hydronephrosis and impending     urosepsis.   PROCEDURES:  1. Cystourethroscopy with right retrograde ureteropyelogram.  2. Placement of right double J stent.  3. Culture and sensitivity of right renal pelvic fluid.   SURGEON:  Lucrezia Starch. Earlene Plater, M.D.   ANESTHESIA:  General endotracheal.   ESTIMATED BLOOD LOSS:  Negligible.   TUBES:  A 24 cm 6 [place] Surgitek double pigtail stent.   COMPLICATIONS:  None.   INDICATION FOR PROCEDURE:  The patient is a very nice 50 year old white  female who presented with right flank pain, nausea, vomiting, fevers,  sweats, and chills.  She was originally seen by Dr. Raphael Gibney office and was  subsequently sent to see Dr. Wanda Plump, with a CT scan that showed a right  distal calculus and right hydronephrosis in essentially solitary functional  right renal unit.  She had a fever of 102 and after understanding risks,  benefits, and alternatives, elected to proceed with the above procedure.   DESCRIPTION OF PROCEDURE:  The patient was placed in the supine position,  after proper general endotracheal anesthesia was placed in the dorsal  lithotomy position and prepped and draped with Betadine in sterile fashion.  Cystourethroscopy was performed with a 22.5 French Olympus panendoscope  utilizing the 12 and 70 degree lenses.  The bladder was carefully inspected.  There were a few stone fragments in the bladder.  The trigone was somewhat  inflamed, especially the  right ureteral orifice, but no other lesions were  noted.  There was a left trigone with a tiny orifice noted.  Under  fluoroscopic guidance a 0.038 French Teflon-coated guidewire was placed into  the right renal pelvis and a gush of fluid was noted.  A 6 French open-ended  catheter was placed in the right renal pelvis and pelvic fluid was aspirated  and submitted for aerobic and anaerobic culture and sensitivity.  A small  amount of dye was injected.  The system was hydronephrotic.  The wire was  replaced.  Under fluoroscopic guidance a 24 cm, 6 French  Bard double pigtail stent was placed and noted to be in good position within  the right renal pelvis within the bladder, and a hydronephrotic drip was  noted from the stent.  The bladder was removed and the panendoscope was  removed.  The patient was taken to the recovery room in stable condition.  Ronald L. Ovidio Hanger, M.D.    RLD/MEDQ  D:  10/30/2002  T:  10/31/2002  Job:  [id number]

## 2011-03-02 NOTE — Op Note (Signed)
[place]. [facility]  Patient:    Angelica Gray, Angelica Gray                        MRN: [id number] Proc. Date: 05/22/00 Adm. Date:  [id number] Attending:  Frederich Balding                           Operative Report  PREOPERATIVE DIAGNOSIS: Request for permanent sterilization.  POSTOPERATIVE DIAGNOSIS: Request for permanent sterilization.  OPERATION/PROCEDURE: Modified [place] postpartum tubal ligation.  SURGEON: Duke Salvia. Marcelle Overlie, M.D.  ANESTHESIA: General endotracheal.  COMPLICATIONS: None.  DRAINS: In-and-out Foley catheter.  ESTIMATED BLOOD LOSS: Less than 5 cc.  DESCRIPTION OF PROCEDURE: The patient was taken to the operating room and after an adequate level of general endotracheal anesthesia was obtained and with the patient supine the abdomen was prepped with Betadine and draped in the usual manner for sterile abdominal procedure.  The subumbilical area was infiltrated with 1/2% Marcaine plain and a small skin incision made and carried down individually through the fascia and peritoneum.  Army-Navy retractors were positioned.  The right tube was identified, grasped with a Babcock clamp, traced to the fimbriated end for verification, re-grasped at the mid segment, and a 0 plain suture then tied around each end of a mid segment knuckle of tube, which was excised.  The cut ends were cauterized with the Bovie.  A 2-0 silk suture was placed around the proximal segment and this area was hemostatic.  The exact same procedure was repeated on the opposite side after carefully identifying the tube.  These areas were hemostatic. Prior to closure sponge, needle, and instrument counts were reported as correct x 2.  The fascia was closed with running 2-0 Dexon suture and the skin closed with 4-0 Dexon subcuticular.  She went to the recovery room in good condition. DD:  05/22/00 TD:  05/23/00 Job: [id number]

## 2011-03-06 ENCOUNTER — Encounter: Payer: Self-pay | Admitting: Internal Medicine

## 2011-03-09 ENCOUNTER — Other Ambulatory Visit: Payer: Self-pay

## 2011-03-16 ENCOUNTER — Other Ambulatory Visit (HOSPITAL_COMMUNITY)
Admission: RE | Admit: 2011-03-16 | Discharge: 2011-03-16 | Disposition: A | Discharge status: Home / Self Care | Payer: BC Managed Care – PPO | Source: Ambulatory Visit | Attending: Internal Medicine | Admitting: Internal Medicine

## 2011-03-16 ENCOUNTER — Encounter: Payer: Self-pay | Admitting: Internal Medicine

## 2011-03-16 ENCOUNTER — Ambulatory Visit (INDEPENDENT_AMBULATORY_CARE_PROVIDER_SITE_OTHER): Payer: BC Managed Care – PPO | Admitting: Internal Medicine

## 2011-03-16 DIAGNOSIS — R739 Hyperglycemia, unspecified: Secondary | ICD-10-CM

## 2011-03-16 DIAGNOSIS — E559 Vitamin D deficiency, unspecified: Secondary | ICD-10-CM

## 2011-03-16 DIAGNOSIS — Z0000 Encounter for general adult medical examination without abnormal findings: Secondary | ICD-10-CM

## 2011-03-16 DIAGNOSIS — Z01419 Encounter for gynecological examination (general) (routine) without abnormal findings: Secondary | ICD-10-CM | POA: Insufficient documentation

## 2011-03-16 DIAGNOSIS — E039 Hypothyroidism, unspecified: Secondary | ICD-10-CM

## 2011-03-16 DIAGNOSIS — R945 Abnormal results of liver function studies: Secondary | ICD-10-CM

## 2011-03-16 DIAGNOSIS — R7309 Other abnormal glucose: Secondary | ICD-10-CM

## 2011-03-16 DIAGNOSIS — Z124 Encounter for screening for malignant neoplasm of cervix: Secondary | ICD-10-CM

## 2011-03-16 LAB — HEPATIC FUNCTION PANEL
ALT: 25 U/L (ref 0–35)
Albumin: 4 g/dL (ref 3.5–5.2)
Total Protein: 7 g/dL (ref 6.0–8.3)

## 2011-03-16 LAB — CBC WITH DIFFERENTIAL/PLATELET
Basophils Absolute: 0 10*3/uL (ref 0.0–0.1)
Eosinophils Absolute: 0.1 10*3/uL (ref 0.0–0.7)
HCT: 37.4 % (ref 36.0–46.0)
Hemoglobin: 12.7 g/dL (ref 12.0–15.0)
Lymphs Abs: 1.6 10*3/uL (ref 0.7–4.0)
MCHC: 33.9 g/dL (ref 30.0–36.0)
MCV: 86.5 fl (ref 78.0–100.0)
Monocytes Absolute: 0.5 10*3/uL (ref 0.1–1.0)
Monocytes Relative: 9 % (ref 3.0–12.0)
Neutro Abs: 3.2 10*3/uL (ref 1.4–7.7)
RDW: 13.9 % (ref 11.5–14.6)

## 2011-03-16 LAB — BASIC METABOLIC PANEL
BUN: 20 mg/dL (ref 6–23)
CO2: 26 mEq/L (ref 19–32)
Calcium: 9.1 mg/dL (ref 8.4–10.5)
Chloride: 106 mEq/L (ref 96–112)
Creatinine, Ser: 1 mg/dL (ref 0.4–1.2)

## 2011-03-16 LAB — LIPID PANEL
Cholesterol: 174 mg/dL (ref 0–200)
HDL: 55.5 mg/dL (ref >39.00)
Triglycerides: 79 mg/dL (ref 0.0–149.0)

## 2011-03-16 NOTE — Progress Notes (Signed)
Subjective:    Patient ID: Angelica Gray, female    DOB: [birth year], 50 y.o.   MRN: [id number]  HPI patient presents for complete physical examination.  She has chronic problems it would follow also of hypothyroidism a history of hypo-and hyperglycemia with strong family history of diabetes vitamin D deficiency and history of restless leg syndrome.  She states that she has been doing reasonably well she has gained weight and this increases her risk factors for diabetes    Review of Systems  Constitutional: Negative for activity change, appetite change and fatigue.  HENT: Negative for ear pain, congestion, neck pain, postnasal drip and sinus pressure.   Eyes: Negative for redness and visual disturbance.  Respiratory: Negative for cough, shortness of breath and wheezing.   Gastrointestinal: Negative for abdominal pain and abdominal distention.  Genitourinary: Negative for dysuria, frequency and menstrual problem.  Musculoskeletal: Negative for myalgias, joint swelling and arthralgias.  Skin: Negative for rash and wound.  Neurological: Negative for dizziness, weakness and headaches.  Hematological: Negative for adenopathy. Does not bruise/bleed easily.  Psychiatric/Behavioral: Negative for sleep disturbance and decreased concentration.   Past Medical History  Diagnosis Date  . Thyroid disease   . Insomnia   . Hyperlipidemia   . Atrophic kidney left   Past Surgical History  Procedure Date  . Nephrectomy     reports that she has never smoked. She does not have any smokeless tobacco history on file. She reports that she does not drink alcohol or use illicit drugs. family history includes Diabetes in her father and mother; Hyperlipidemia in her mother; Kidney disease in her mother; and Kidney failure in an unspecified family member. No Known Allergies     Objective:   Physical Exam  Constitutional: She is oriented to person, place, and time. She appears well-developed and  well-nourished. No distress.  HENT:  Head: Normocephalic and atraumatic.  Right Ear: External ear normal.  Left Ear: External ear normal.  Nose: Nose normal.  Mouth/Throat: Oropharynx is clear and moist.  Eyes: Conjunctivae and EOM are normal. Pupils are equal, round, and reactive to light.  Neck: Normal range of motion. Neck supple. No JVD present. No tracheal deviation present. No thyromegaly present.  Cardiovascular: Normal rate, regular rhythm, normal heart sounds and intact distal pulses.   No murmur heard. Pulmonary/Chest: Effort normal and breath sounds normal. She has no wheezes. She exhibits no tenderness.  Abdominal: Soft. Bowel sounds are normal.  Musculoskeletal: Normal range of motion. She exhibits no edema and no tenderness.  Lymphadenopathy:    She has no cervical adenopathy.  Neurological: She is alert and oriented to person, place, and time. She has normal reflexes. No cranial nerve deficit.  Skin: Skin is warm and dry. She is not diaphoretic.  Psychiatric: She has a normal mood and affect. Her behavior is normal.          Assessment & Plan:   This is a routine physical examination for this healthy  Female. Reviewed all health maintenance protocols including mammography colonoscopy bone density and reviewed appropriate screening labs. Her immunization history was reviewed as well as her current medications and allergies refills of her chronic medications were given and the plan for yearly health maintenance was discussed all orders and referrals were made as appropriate. A referral was made from mammography appropriate screening test were added to her physical examination including a vitamin D level for history of vitamin D deficiency a hemoglobin A1c for hypo-and hyperglycemia in the setting of  a strong family history of adult-onset diabetes

## 2011-03-19 NOTE — Progress Notes (Signed)
Addended by: Willy Eddy on: 03/19/2011 03:26 PM   Modules accepted: Orders

## 2011-03-30 ENCOUNTER — Other Ambulatory Visit: Payer: Self-pay | Admitting: *Deleted

## 2011-03-30 MED ORDER — LEVOTHYROXINE SODIUM 112 MCG PO TABS: 112.0000 ug | ORAL_TABLET | Freq: Every day | ORAL | Status: DC

## 2011-04-07 ENCOUNTER — Other Ambulatory Visit: Payer: Self-pay | Admitting: Internal Medicine

## 2011-08-13 ENCOUNTER — Ambulatory Visit (INDEPENDENT_AMBULATORY_CARE_PROVIDER_SITE_OTHER): Payer: BC Managed Care – PPO | Admitting: Internal Medicine

## 2011-08-13 DIAGNOSIS — Z23 Encounter for immunization: Secondary | ICD-10-CM

## 2011-09-18 ENCOUNTER — Ambulatory Visit: Payer: BC Managed Care – PPO | Admitting: Internal Medicine

## 2011-11-05 ENCOUNTER — Encounter: Payer: Self-pay | Admitting: Gastroenterology

## 2011-11-07 ENCOUNTER — Other Ambulatory Visit: Payer: Self-pay | Admitting: Internal Medicine

## 2012-03-04 ENCOUNTER — Other Ambulatory Visit: Payer: Self-pay | Admitting: Internal Medicine

## 2012-04-06 ENCOUNTER — Other Ambulatory Visit: Payer: Self-pay | Admitting: Internal Medicine

## 2012-05-03 ENCOUNTER — Other Ambulatory Visit: Payer: Self-pay | Admitting: Internal Medicine

## 2012-05-05 ENCOUNTER — Other Ambulatory Visit: Payer: Self-pay | Admitting: *Deleted

## 2012-05-05 MED ORDER — VITAMIN D (ERGOCALCIFEROL) 1.25 MG (50000 UNIT) PO CAPS: 50000.0000 [IU] | ORAL_CAPSULE | ORAL | Status: DC

## 2012-07-15 ENCOUNTER — Encounter: Payer: Self-pay | Admitting: Gastroenterology

## 2013-03-19 ENCOUNTER — Other Ambulatory Visit: Payer: Self-pay | Admitting: Internal Medicine

## 2013-04-27 ENCOUNTER — Other Ambulatory Visit: Payer: Self-pay | Admitting: *Deleted

## 2013-05-01 ENCOUNTER — Other Ambulatory Visit: Payer: Self-pay | Admitting: *Deleted

## 2013-05-01 MED ORDER — LEVOTHYROXINE SODIUM 112 MCG PO TABS: ORAL_TABLET | ORAL | Status: DC

## 2013-06-01 ENCOUNTER — Other Ambulatory Visit: Payer: Self-pay | Admitting: *Deleted

## 2013-06-01 MED ORDER — LEVOTHYROXINE SODIUM 112 MCG PO TABS: ORAL_TABLET | ORAL | Status: DC

## 2013-06-03 ENCOUNTER — Telehealth: Payer: Self-pay | Admitting: Internal Medicine

## 2013-06-03 ENCOUNTER — Ambulatory Visit: Payer: BC Managed Care – PPO | Admitting: Family

## 2013-06-03 MED ORDER — LEVOTHYROXINE SODIUM 112 MCG PO TABS: ORAL_TABLET | ORAL | Status: DC

## 2013-06-03 NOTE — Telephone Encounter (Signed)
done

## 2013-06-03 NOTE — Telephone Encounter (Addendum)
Pt missed appt with NP on 06-03-13. Pt would like a 30 day supply of levothyroxine 112 mcg call into [facility] [place]. Pt has appt sch for 06-09-13 at 215pm with NP.

## 2013-06-08 ENCOUNTER — Encounter: Payer: Self-pay | Admitting: Family

## 2013-06-08 ENCOUNTER — Ambulatory Visit (INDEPENDENT_AMBULATORY_CARE_PROVIDER_SITE_OTHER): Payer: BC Managed Care – PPO | Admitting: Family

## 2013-06-08 VITALS — BP 118/70 | HR 87 | Wt 242.0 lb

## 2013-06-08 DIAGNOSIS — Z1211 Encounter for screening for malignant neoplasm of colon: Secondary | ICD-10-CM

## 2013-06-08 DIAGNOSIS — E039 Hypothyroidism, unspecified: Secondary | ICD-10-CM

## 2013-06-08 DIAGNOSIS — Z1231 Encounter for screening mammogram for malignant neoplasm of breast: Secondary | ICD-10-CM

## 2013-06-08 LAB — POCT URINALYSIS DIPSTICK
Blood, UA: NEGATIVE
Ketones, UA: NEGATIVE
Protein, UA: NEGATIVE
Spec Grav, UA: >=1.03

## 2013-06-08 LAB — BASIC METABOLIC PANEL
CO2: 28 mEq/L (ref 19–32)
Chloride: 103 mEq/L (ref 96–112)
Creatinine, Ser: 1.2 mg/dL (ref 0.4–1.2)
Glucose, Bld: 129 mg/dL — ABNORMAL HIGH (ref 70–99)

## 2013-06-08 LAB — TSH: TSH: 1.21 u[IU]/mL (ref 0.35–5.50)

## 2013-06-08 NOTE — Progress Notes (Signed)
  Subjective:    Patient ID: Angelica Gray, female    DOB: 27-Dec-1960, 52 y.o.   MRN: [id number]  HPI 52 year old female, nonsmoker, patient of Dr. Lovell Sheehan in today for recheck of hypothyroidism. She currently takes levothyroxin and 50 mcg daily. She is tolerating it well. Denies any concerns with the medication. Would like to reduce her weight. She went outside to return for complete physical exam has soon as possible.   Review of Systems  Constitutional: Negative.   HENT: Negative.   Respiratory: Negative.   Cardiovascular: Negative.   Endocrine: Negative.   Musculoskeletal: Negative.   Skin: Negative.   Psychiatric/Behavioral: Negative.    Past Medical History  Diagnosis Date  . Thyroid disease   . Insomnia   . Hyperlipidemia   . Atrophic kidney left    History   Social History  . Marital Status: Married    Spouse Name: N/A    Number of Children: N/A  . Years of Education: N/A   Occupational History  . Not on file.   Social History Main Topics  . Smoking status: Never Smoker   . Smokeless tobacco: Not on file  . Alcohol Use: No  . Drug Use: No  . Sexual Activity: Yes   Other Topics Concern  . Not on file   Social History Narrative  . No narrative on file    Past Surgical History  Procedure Laterality Date  . Nephrectomy      Family History  Problem Relation Age of Onset  . Kidney failure    . Hyperlipidemia Mother   . Diabetes Mother   . Kidney disease Mother   . Diabetes Father     No Known Allergies  Current Outpatient Prescriptions on File Prior to Visit  Medication Sig Dispense Refill  . levothyroxine (SYNTHROID, LEVOTHROID) 112 MCG tablet TAKE ONE TABLET BY MOUTH EVERY DAY  30 tablet  0  . Vitamin D, Ergocalciferol, (DRISDOL) 50000 UNITS CAPS TAKE ONE CAPSULE BY MOUTH EVERY 7 DAYS  5 capsule  0   No current facility-administered medications on file prior to visit.    BP 118/70  Pulse 87  Wt 242 lb (109.77 kg)  BMI 38.48 kg/m2chart    Objective:   Physical Exam  Constitutional: She is oriented to person, place, and time. She appears well-developed and well-nourished.  Neck: Normal range of motion. Neck supple. No thyromegaly present.  Cardiovascular: Normal rate, regular rhythm and normal heart sounds.   Pulmonary/Chest: Effort normal and breath sounds normal.  Neurological: She is alert and oriented to person, place, and time.  Skin: Skin is warm and dry.  Psychiatric: She has a normal mood and affect.          Assessment & Plan:  Assessment: 1. Hypothyroidism  Plan: Return for complete physical exam with fasting labs. I will schedule mammogram, colonoscopy. call the office with any questions or concerns. Recheck as scheduled on an as needed.

## 2013-06-08 NOTE — Addendum Note (Signed)
Addended byAdline Mango B on: 06/08/2013 03:28 PM   Modules accepted: Orders

## 2013-06-18 ENCOUNTER — Ambulatory Visit (INDEPENDENT_AMBULATORY_CARE_PROVIDER_SITE_OTHER): Payer: BC Managed Care – PPO | Admitting: Family

## 2013-06-18 ENCOUNTER — Telehealth: Payer: Self-pay | Admitting: Internal Medicine

## 2013-06-18 ENCOUNTER — Encounter: Payer: Self-pay | Admitting: Family

## 2013-06-18 ENCOUNTER — Other Ambulatory Visit (HOSPITAL_COMMUNITY)
Admission: RE | Admit: 2013-06-18 | Discharge: 2013-06-18 | Disposition: A | Discharge status: Home / Self Care | Payer: BC Managed Care – PPO | Source: Ambulatory Visit | Attending: Family | Admitting: Family

## 2013-06-18 VITALS — BP 120/82 | HR 97 | Ht 66.25 in | Wt 243.0 lb

## 2013-06-18 DIAGNOSIS — R7309 Other abnormal glucose: Secondary | ICD-10-CM

## 2013-06-18 DIAGNOSIS — E559 Vitamin D deficiency, unspecified: Secondary | ICD-10-CM

## 2013-06-18 DIAGNOSIS — Z124 Encounter for screening for malignant neoplasm of cervix: Secondary | ICD-10-CM

## 2013-06-18 DIAGNOSIS — Z0000 Encounter for general adult medical examination without abnormal findings: Secondary | ICD-10-CM

## 2013-06-18 DIAGNOSIS — R739 Hyperglycemia, unspecified: Secondary | ICD-10-CM

## 2013-06-18 DIAGNOSIS — E039 Hypothyroidism, unspecified: Secondary | ICD-10-CM

## 2013-06-18 DIAGNOSIS — Z01419 Encounter for gynecological examination (general) (routine) without abnormal findings: Secondary | ICD-10-CM | POA: Insufficient documentation

## 2013-06-18 LAB — POCT URINALYSIS DIPSTICK
Bilirubin, UA: NEGATIVE
Ketones, UA: NEGATIVE

## 2013-06-18 LAB — LIPID PANEL
Cholesterol: 181 mg/dL (ref 0–200)
HDL: 49 mg/dL (ref >39.00)
VLDL: 15.2 mg/dL (ref 0.0–40.0)

## 2013-06-18 LAB — GLUCOSE, POCT (MANUAL RESULT ENTRY): POC Glucose: 121 mg/dl — AB (ref 70–99)

## 2013-06-18 MED ORDER — LORCASERIN HCL 10 MG PO TABS: 10.0000 mg | ORAL_TABLET | Freq: Two times a day (BID) | ORAL | Status: DC

## 2013-06-18 MED ORDER — PHENTERMINE HCL 37.5 MG PO CAPS: 37.5000 mg | ORAL_CAPSULE | ORAL | Status: DC

## 2013-06-18 NOTE — Telephone Encounter (Signed)
PT is requesting that her physical be mailed to her as soon as possible. Release form completed. Please assist.

## 2013-06-18 NOTE — Telephone Encounter (Signed)
noted 

## 2013-06-18 NOTE — Addendum Note (Signed)
Addended byAdline Mango B on: 06/18/2013 10:28 AM   Modules accepted: Orders

## 2013-06-18 NOTE — Patient Instructions (Signed)

## 2013-06-18 NOTE — Progress Notes (Signed)
Subjective:    Patient ID: Angelica Gray, female    DOB: December 16, 1960, 52 y.o.   MRN: [id number]  HPI 52 year old female, nonsmoker, patient of Dr. Lovell Sheehan is in for a routine physical examination for this healthy  Female. Reviewed all health maintenance protocols including mammography colonoscopy  and reviewed appropriate screening labs. Her immunization history was reviewed as well as her current medications and allergies refills of her chronic medications were given and the plan for yearly health maintenance was discussed all orders and referrals were made as appropriate.   Review of Systems  Constitutional: Negative.   HENT: Negative.   Eyes: Negative.   Respiratory: Negative.   Cardiovascular: Negative.   Gastrointestinal: Negative.   Endocrine: Negative.   Genitourinary: Negative.   Musculoskeletal: Negative.   Skin: Negative.   Allergic/Immunologic: Negative.   Neurological: Negative.   Hematological: Negative.   Psychiatric/Behavioral: Negative.    Past Medical History  Diagnosis Date  . Thyroid disease   . Insomnia   . Hyperlipidemia   . Atrophic kidney left    History   Social History  . Marital Status: Married    Spouse Name: N/A    Number of Children: N/A  . Years of Education: N/A   Occupational History  . Not on file.   Social History Main Topics  . Smoking status: Never Smoker   . Smokeless tobacco: Not on file  . Alcohol Use: No  . Drug Use: No  . Sexual Activity: Yes   Other Topics Concern  . Not on file   Social History Narrative  . No narrative on file    Past Surgical History  Procedure Laterality Date  . Nephrectomy      Family History  Problem Relation Age of Onset  . Kidney failure    . Hyperlipidemia Mother   . Diabetes Mother   . Kidney disease Mother   . Diabetes Father     No Known Allergies  Current Outpatient Prescriptions on File Prior to Visit  Medication Sig Dispense Refill  . levothyroxine (SYNTHROID,  LEVOTHROID) 112 MCG tablet TAKE ONE TABLET BY MOUTH EVERY DAY  30 tablet  0  . Vitamin D, Ergocalciferol, (DRISDOL) 50000 UNITS CAPS TAKE ONE CAPSULE BY MOUTH EVERY 7 DAYS  5 capsule  0   No current facility-administered medications on file prior to visit.    BP 120/82  Pulse 97  Ht 5' 6.25" (1.683 m)  Wt 243 lb (110.224 kg)  BMI 38.91 kg/m2chart     Objective:   Physical Exam  Constitutional: She is oriented to person, place, and time. She appears well-developed and well-nourished.  HENT:  Head: Normocephalic.  Right Ear: External ear normal.  Left Ear: External ear normal.  Nose: Nose normal.  Mouth/Throat: Oropharynx is clear and moist.  Eyes: Conjunctivae and EOM are normal. Pupils are equal, round, and reactive to light.  Neck: Normal range of motion. Neck supple. No thyromegaly present.  Cardiovascular: Normal rate, regular rhythm and normal heart sounds.   Pulmonary/Chest: Effort normal and breath sounds normal.  Abdominal: Soft. Bowel sounds are normal. She exhibits no distension. There is no tenderness. There is no rebound.  Genitourinary: Vagina normal and uterus normal. Guaiac negative stool. No vaginal discharge found.  Musculoskeletal: Normal range of motion.  Neurological: She is alert and oriented to person, place, and time. She has normal reflexes. No cranial nerve deficit. Coordination normal.  Skin: Skin is warm and dry.  Psychiatric: She has a  normal mood and affect.          Assessment & Plan:  Assessment: 1. CPX 2. Vitamin D deficiency 3. Hyperglycemia 4. Obesity 5. Pap Smear 6. Hypothyroidism

## 2013-06-22 ENCOUNTER — Other Ambulatory Visit: Payer: Self-pay | Admitting: *Deleted

## 2013-06-22 MED ORDER — VITAMIN D (ERGOCALCIFEROL) 1.25 MG (50000 UNIT) PO CAPS: ORAL_CAPSULE | ORAL | Status: DC

## 2013-06-30 ENCOUNTER — Other Ambulatory Visit: Payer: Self-pay | Admitting: *Deleted

## 2013-06-30 MED ORDER — VITAMIN D (ERGOCALCIFEROL) 1.25 MG (50000 UNIT) PO CAPS: ORAL_CAPSULE | ORAL | Status: DC

## 2013-07-10 ENCOUNTER — Encounter: Payer: Self-pay | Admitting: Internal Medicine

## 2013-07-20 ENCOUNTER — Ambulatory Visit: Payer: BC Managed Care – PPO | Admitting: Family

## 2013-08-03 ENCOUNTER — Ambulatory Visit: Payer: BC Managed Care – PPO | Admitting: Family

## 2013-08-03 ENCOUNTER — Telehealth: Payer: Self-pay | Admitting: Internal Medicine

## 2013-08-03 NOTE — Telephone Encounter (Signed)
Pt was late for appointment today, rescheduled to next Monday at 4 pm. Pt also stated that when she was here last, she was told that Angelica Gray could find and fill out a physical assessment form for her employer [employer] and have it mailed to her.  I explained to pt that I was unsure about this because typically the patient supplies the form, and in the mean time the pt needs to obtain a form for her employer and bring when she comes next Monday.  Please advise.

## 2013-08-04 NOTE — Telephone Encounter (Signed)
Left message on cell phone machine informing patient to obtain and bring form with her on next week to be completed.

## 2013-08-04 NOTE — Telephone Encounter (Signed)
I do not keep physical forms. She will need to provide the form.

## 2013-08-10 ENCOUNTER — Ambulatory Visit (INDEPENDENT_AMBULATORY_CARE_PROVIDER_SITE_OTHER): Payer: BC Managed Care – PPO | Admitting: Family

## 2013-08-10 ENCOUNTER — Encounter: Payer: Self-pay | Admitting: Family

## 2013-08-10 VITALS — BP 116/80 | HR 86 | Wt 233.0 lb

## 2013-08-10 DIAGNOSIS — E039 Hypothyroidism, unspecified: Secondary | ICD-10-CM

## 2013-08-10 DIAGNOSIS — E669 Obesity, unspecified: Secondary | ICD-10-CM

## 2013-08-10 DIAGNOSIS — R200 Anesthesia of skin: Secondary | ICD-10-CM

## 2013-08-10 DIAGNOSIS — R209 Unspecified disturbances of skin sensation: Secondary | ICD-10-CM

## 2013-08-10 MED ORDER — VITAMIN D (ERGOCALCIFEROL) 1.25 MG (50000 UNIT) PO CAPS: ORAL_CAPSULE | ORAL | Status: DC

## 2013-08-10 MED ORDER — LEVOTHYROXINE SODIUM 112 MCG PO TABS: ORAL_TABLET | ORAL | Status: DC

## 2013-08-11 ENCOUNTER — Encounter: Payer: Self-pay | Admitting: Family

## 2013-08-11 LAB — TSH: TSH: 0.86 u[IU]/mL (ref 0.35–5.50)

## 2013-08-11 NOTE — Progress Notes (Signed)
Subjective:    Patient ID: Angelica Gray, female    DOB: [birth year], 52 y.o.   MRN: [id number]  HPI  52 year old white female, patient of Dr. Lovell Sheehan is in today for followup of obesity. She began phentermine 37.5 mg once daily and is now down 10 pounds after about 7 weeks. However, she complains of numbness in her left chest and left arm but she contributes to anxiety and being more anxious since being on the medication. Reports she is able to take a couple of deep breaths and the numbness subsides. Denies any chest pain or palpitations. No lightheadedness or dizziness. She also has some insomnia. Patient has a history of hypothyroidism and has had some difficulty over the last 6 months remaining stable.  Review of Systems  Constitutional: Negative.   HENT: Negative.   Respiratory: Negative.  Negative for chest tightness, shortness of breath and wheezing.   Cardiovascular: Negative.  Negative for chest pain and palpitations.  Gastrointestinal: Negative.   Endocrine: Negative.   Genitourinary: Negative.   Musculoskeletal: Negative.   Skin: Negative.   Neurological: Positive for numbness.       Left arm and chest  Hematological: Negative.   Psychiatric/Behavioral: Positive for sleep disturbance. The patient is nervous/anxious.    Past Medical History  Diagnosis Date  . Thyroid disease   . Insomnia   . Hyperlipidemia   . Atrophic kidney left    History   Social History  . Marital Status: Married    Spouse Name: N/A    Number of Children: N/A  . Years of Education: N/A   Occupational History  . Not on file.   Social History Main Topics  . Smoking status: Never Smoker   . Smokeless tobacco: Not on file  . Alcohol Use: No  . Drug Use: No  . Sexual Activity: Yes   Other Topics Concern  . Not on file   Social History Narrative  . No narrative on file    Past Surgical History  Procedure Laterality Date  . Nephrectomy      Family History  Problem Relation Age of  Onset  . Kidney failure    . Hyperlipidemia Mother   . Diabetes Mother   . Kidney disease Mother   . Diabetes Father     No Known Allergies  Current Outpatient Prescriptions on File Prior to Visit  Medication Sig Dispense Refill  . phentermine 37.5 MG capsule Take 1 capsule (37.5 mg total) by mouth every morning.  30 capsule  1   No current facility-administered medications on file prior to visit.    BP 116/80  Pulse 86  Wt 233 lb (105.688 kg)  BMI 37.31 kg/m2chart    Objective:   Physical Exam  Constitutional: She is oriented to person, place, and time. She appears well-developed and well-nourished.  HENT:  Right Ear: External ear normal.  Left Ear: External ear normal.  Nose: Nose normal.  Mouth/Throat: Oropharynx is clear and moist.  Neck: Normal range of motion. Neck supple.  Cardiovascular: Normal rate, regular rhythm and normal heart sounds.   Pulmonary/Chest: Effort normal and breath sounds normal.  Abdominal: Soft. Bowel sounds are normal.  Musculoskeletal: Normal range of motion.  Neurological: She is alert and oriented to person, place, and time.  Skin: Skin is warm and dry.  Psychiatric: She has a normal mood and affect.          Assessment & Plan:  Assessment: 1. Obesity 2. Hypothyroidism 3. Left  upper sternal numbness  Plan: Hold phentermine. TSH sent today. Will notify patient and the results. We'll see how her symptoms are off of the medication and determine it is reasonable to resume. However, the benefits versus risk may not be worth continuing the medication at this point. I believe she is able to lose weight by diet and exercise at the rate she is currently losing on the medication.

## 2014-09-18 ENCOUNTER — Other Ambulatory Visit: Payer: Self-pay | Admitting: Family

## 2014-09-29 ENCOUNTER — Other Ambulatory Visit: Payer: Self-pay | Admitting: Family

## 2014-10-01 ENCOUNTER — Telehealth: Payer: Self-pay | Admitting: Internal Medicine

## 2014-10-01 MED ORDER — LEVOTHYROXINE SODIUM 112 MCG PO TABS: ORAL_TABLET | ORAL | Status: DC

## 2014-10-01 MED ORDER — VITAMIN D (ERGOCALCIFEROL) 1.25 MG (50000 UNIT) PO CAPS: ORAL_CAPSULE | ORAL | Status: DC

## 2014-10-01 NOTE — Telephone Encounter (Signed)
Done

## 2014-10-01 NOTE — Telephone Encounter (Signed)
Pt needs a refill on vit d and levothyroxine 112 mcg call into [facility] [place] ,[place]. Pt has an appt sch for 10-13-14. Can pt have at least one refill on each med?

## 2014-10-13 ENCOUNTER — Ambulatory Visit (INDEPENDENT_AMBULATORY_CARE_PROVIDER_SITE_OTHER): Payer: BC Managed Care – PPO | Admitting: Family

## 2014-10-13 ENCOUNTER — Encounter: Payer: Self-pay | Admitting: Family

## 2014-10-13 ENCOUNTER — Telehealth: Payer: Self-pay | Admitting: Internal Medicine

## 2014-10-13 VITALS — BP 112/80 | HR 106 | Temp 97.9°F | Wt 233.8 lb

## 2014-10-13 DIAGNOSIS — R252 Cramp and spasm: Secondary | ICD-10-CM

## 2014-10-13 DIAGNOSIS — E039 Hypothyroidism, unspecified: Secondary | ICD-10-CM

## 2014-10-13 DIAGNOSIS — J069 Acute upper respiratory infection, unspecified: Secondary | ICD-10-CM

## 2014-10-13 LAB — COMPREHENSIVE METABOLIC PANEL
ALBUMIN: 3.9 g/dL (ref 3.5–5.2)
ALK PHOS: 117 U/L (ref 39–117)
ALT: 20 U/L (ref 0–35)
AST: 18 U/L (ref 0–37)
BUN: 18 mg/dL (ref 6–23)
CHLORIDE: 108 meq/L (ref 96–112)
CO2: 26 mEq/L (ref 19–32)
Calcium: 8.9 mg/dL (ref 8.4–10.5)
Creatinine, Ser: 1.1 mg/dL (ref 0.4–1.2)
GFR: 53.53 mL/min — ABNORMAL LOW (ref >60.00)
Glucose, Bld: 72 mg/dL (ref 70–99)
Potassium: 4.3 mEq/L (ref 3.5–5.1)
Sodium: 142 mEq/L (ref 135–145)
Total Bilirubin: 0.4 mg/dL (ref 0.2–1.2)
Total Protein: 7.4 g/dL (ref 6.0–8.3)

## 2014-10-13 LAB — TSH: TSH: 1.21 u[IU]/mL (ref 0.35–4.50)

## 2014-10-13 NOTE — Progress Notes (Signed)
Subjective:    Patient ID: Angelica Gray, female    DOB: 07-20-1961, 53 y.o.   MRN: [id number]  HPI 53 year old white female, nonsmoker with a history of hypothyroidism is in today for recheck. Is stable on medication. Has complaints of cough, congestion 4-5 days. Taking Claritin over-the-counter that has helped some. However continues to be congested. Also reports having leg cramps that occur infrequently. Denies any swelling.   Review of Systems  Constitutional: Negative.   HENT: Positive for congestion, sinus pressure, sneezing and sore throat.   Respiratory: Positive for cough.   Cardiovascular: Negative.   Gastrointestinal: Negative.   Endocrine: Negative.   Genitourinary: Negative.   Musculoskeletal: Negative for joint swelling.       Bilateral leg cramps  Skin: Negative.   Neurological: Negative.   Psychiatric/Behavioral: Negative.    Past Medical History  Diagnosis Date  . Thyroid disease   . Insomnia   . Hyperlipidemia   . Atrophic kidney left    History   Social History  . Marital Status: Married    Spouse Name: N/A    Number of Children: N/A  . Years of Education: N/A   Occupational History  . Not on file.   Social History Main Topics  . Smoking status: Never Smoker   . Smokeless tobacco: Not on file  . Alcohol Use: No  . Drug Use: No  . Sexual Activity: Yes   Other Topics Concern  . Not on file   Social History Narrative    Past Surgical History  Procedure Laterality Date  . Nephrectomy      Family History  Problem Relation Age of Onset  . Kidney failure    . Hyperlipidemia Mother   . Diabetes Mother   . Kidney disease Mother   . Diabetes Father     No Known Allergies  Current Outpatient Prescriptions on File Prior to Visit  Medication Sig Dispense Refill  . levothyroxine (SYNTHROID, LEVOTHROID) 112 MCG tablet TAKE ONE TABLET BY MOUTH EVERY DAY 30 tablet 0  . Vitamin D, Ergocalciferol, (DRISDOL) 50000 UNITS CAPS capsule TAKE ONE  CAPSULE BY MOUTH EVERY 7 DAYS 20 capsule 0   No current facility-administered medications on file prior to visit.    BP 112/80 mmHg  Pulse 106  Temp(Src) 97.9 F (36.6 C) (Oral)  Wt 233 lb 12.8 oz (106.051 kg)chart    Objective:   Physical Exam  Constitutional: She is oriented to person, place, and time. She appears well-developed and well-nourished.  HENT:  Right Ear: External ear normal.  Left Ear: External ear normal.  Mouth/Throat: Oropharynx is clear and moist.  Neck: Normal range of motion. Neck supple.  Cardiovascular: Normal rate, regular rhythm and normal heart sounds.   Pulmonary/Chest: Effort normal and breath sounds normal.  Abdominal: Soft. Bowel sounds are normal.  Musculoskeletal: Normal range of motion.  Neurological: She is alert and oriented to person, place, and time.  Skin: Skin is warm and dry.  Psychiatric: She has a normal mood and affect.          Assessment & Plan:  Angelica Gray was seen today for follow-up.  Diagnoses and associated orders for this visit:  Hypothyroidism, unspecified hypothyroidism type - TSH - Cancel: TSH  Cramp of both lower extremities - CMP  Acute upper respiratory infection    Suggest Flonase 2 sprays in his nostril once a day along with Claritin. Continue Synthroid. Encouraged healthy diet and exercise and suggest weight loss. Recheck in 6 months  for complete physical exam.

## 2014-10-13 NOTE — Patient Instructions (Signed)
Exercise to Lose Weight Exercise and a healthy diet may help you lose weight. Your doctor may suggest specific exercises. EXERCISE IDEAS AND TIPS  Choose low-cost things you enjoy doing, such as walking, bicycling, or exercising to workout videos.  Take stairs instead of the elevator.  Walk during your lunch break.  Park your car further away from work or school.  Go to a gym or an exercise class.  Start with 5 to 10 minutes of exercise each day. Build up to 30 minutes of exercise 4 to 6 days a week.  Wear shoes with good support and comfortable clothes.  Stretch before and after working out.  Work out until you breathe harder and your heart beats faster.  Drink extra water when you exercise.  Do not do so much that you hurt yourself, feel dizzy, or get very short of breath. Exercises that burn about 150 calories:  Running 1  miles in 15 minutes.  Playing volleyball for 45 to 60 minutes.  Washing and waxing a car for 45 to 60 minutes.  Playing touch football for 45 minutes.  Walking 1  miles in 35 minutes.  Pushing a stroller 1  miles in 30 minutes.  Playing basketball for 30 minutes.  Raking leaves for 30 minutes.  Bicycling 5 miles in 30 minutes.  Walking 2 miles in 30 minutes.  Dancing for 30 minutes.  Shoveling snow for 15 minutes.  Swimming laps for 20 minutes.  Walking up stairs for 15 minutes.  Bicycling 4 miles in 15 minutes.  Gardening for 30 to 45 minutes.  Jumping rope for 15 minutes.  Washing windows or floors for 45 to 60 minutes. Document Released: 11/03/2010 Document Revised: 12/24/2011 Document Reviewed: 11/03/2010 [employer] Patient Information 2015 [employer]. This information is not intended to replace advice given to you by your health care provider. Make sure you discuss any questions you have with your health care provider.  

## 2014-10-13 NOTE — Progress Notes (Signed)
Pre visit review using our clinic review tool, if applicable. No additional management support is needed unless otherwise documented below in the visit note. 

## 2014-10-13 NOTE — Telephone Encounter (Signed)
Pt kids has seen dr Fabian Sharppanosh and mom would like to become new pt. Pt was seeing NP. Can I sch mom?

## 2014-10-18 ENCOUNTER — Other Ambulatory Visit: Payer: Self-pay | Admitting: *Deleted

## 2014-10-18 MED ORDER — LEVOTHYROXINE SODIUM 112 MCG PO TABS: ORAL_TABLET | ORAL | Status: DC

## 2014-10-31 ENCOUNTER — Other Ambulatory Visit: Payer: Self-pay | Admitting: Family

## 2014-11-01 NOTE — Telephone Encounter (Signed)
Yes     Make sure 30 minutes .

## 2014-11-01 NOTE — Telephone Encounter (Signed)
Pt has been sch

## 2014-11-01 NOTE — Telephone Encounter (Signed)
Has an upcoming appt to be established

## 2014-11-02 NOTE — Telephone Encounter (Signed)
i have not seen her yet and her last vit d level was in 2014 . Wait until OV to decide on refill

## 2014-11-08 ENCOUNTER — Other Ambulatory Visit: Payer: Self-pay

## 2014-11-08 MED ORDER — VITAMIN D (ERGOCALCIFEROL) 1.25 MG (50000 UNIT) PO CAPS: ORAL_CAPSULE | ORAL | Status: DC

## 2014-11-09 ENCOUNTER — Ambulatory Visit: Payer: Self-pay | Admitting: Internal Medicine

## 2014-11-12 ENCOUNTER — Ambulatory Visit (INDEPENDENT_AMBULATORY_CARE_PROVIDER_SITE_OTHER): Payer: BLUE CROSS/BLUE SHIELD | Admitting: Internal Medicine

## 2014-11-12 ENCOUNTER — Encounter: Payer: Self-pay | Admitting: Internal Medicine

## 2014-11-12 VITALS — BP 112/86 | Temp 97.7°F | Ht 65.25 in | Wt 233.2 lb

## 2014-11-12 DIAGNOSIS — E559 Vitamin D deficiency, unspecified: Secondary | ICD-10-CM

## 2014-11-12 DIAGNOSIS — G2581 Restless legs syndrome: Secondary | ICD-10-CM

## 2014-11-12 DIAGNOSIS — E039 Hypothyroidism, unspecified: Secondary | ICD-10-CM

## 2014-11-12 DIAGNOSIS — R739 Hyperglycemia, unspecified: Secondary | ICD-10-CM

## 2014-11-12 DIAGNOSIS — R252 Cramp and spasm: Secondary | ICD-10-CM

## 2014-11-12 LAB — LIPID PANEL
Cholesterol: 175 mg/dL (ref 0–200)
HDL: 50.8 mg/dL (ref >39.00)
LDL Cholesterol: 102 mg/dL — ABNORMAL HIGH (ref 0–99)
NonHDL: 124.2
TRIGLYCERIDES: 111 mg/dL (ref 0.0–149.0)
Total CHOL/HDL Ratio: 3
VLDL: 22.2 mg/dL (ref 0.0–40.0)

## 2014-11-12 LAB — CBC WITH DIFFERENTIAL/PLATELET
Basophils Absolute: 0 10*3/uL (ref 0.0–0.1)
Basophils Relative: 0.4 % (ref 0.0–3.0)
Eosinophils Absolute: 0.1 10*3/uL (ref 0.0–0.7)
Eosinophils Relative: 2.7 % (ref 0.0–5.0)
HEMATOCRIT: 34 % — AB (ref 36.0–46.0)
HEMOGLOBIN: 11 g/dL — AB (ref 12.0–15.0)
LYMPHS ABS: 1.3 10*3/uL (ref 0.7–4.0)
LYMPHS PCT: 29.5 % (ref 12.0–46.0)
MCHC: 32.5 g/dL (ref 30.0–36.0)
MCV: 70.3 fl — ABNORMAL LOW (ref 78.0–100.0)
MONOS PCT: 7.2 % (ref 3.0–12.0)
Monocytes Absolute: 0.3 10*3/uL (ref 0.1–1.0)
NEUTROS ABS: 2.6 10*3/uL (ref 1.4–7.7)
NEUTROS PCT: 60.2 % (ref 43.0–77.0)
Platelets: 263 10*3/uL (ref 150.0–400.0)
RBC: 4.83 Mil/uL (ref 3.87–5.11)
RDW: 18.5 % — AB (ref 11.5–15.5)
WBC: 4.2 10*3/uL (ref 4.0–10.5)

## 2014-11-12 LAB — IBC PANEL
Iron: 23 ug/dL — ABNORMAL LOW (ref 42–145)
Saturation Ratios: 3.9 % — ABNORMAL LOW (ref 20.0–50.0)
Transferrin: 426 mg/dL — ABNORMAL HIGH (ref 212.0–360.0)

## 2014-11-12 LAB — VITAMIN D 25 HYDROXY (VIT D DEFICIENCY, FRACTURES): VITD: 29.75 ng/mL — ABNORMAL LOW (ref 30.00–100.00)

## 2014-11-12 LAB — HEMOGLOBIN A1C: HEMOGLOBIN A1C: 6.3 % (ref 4.6–6.5)

## 2014-11-12 LAB — FERRITIN: Ferritin: 4.1 ng/mL — ABNORMAL LOW (ref 10.0–291.0)

## 2014-11-12 NOTE — Patient Instructions (Addendum)
Checking for anemia  Iron levles and cholesterol and vit d status. Need colon cancer screening   Will plan after getting lab results back .  Healthy lifestyle includes : At least 150 minutes of exercise weeks  , weight at healthy levels, which is usually   BMI 19-25. Avoid trans fats and processed foods;  Increase fresh fruits and veges to 5 servings per day. And avoid sweet beverages including tea and juice. Mediterranean diet with olive oil and nuts have been noted to be heart and brain healthy . Avoid tobacco products . Limit  alcohol to  7 per week for women and 14 servings for men.  Get adequate sleep . Wear seat belts . Don't text and drive .       Why follow it? Research shows. . Those who follow the Mediterranean diet have a reduced risk of heart disease  . The diet is associated with a reduced incidence of Parkinson's and Alzheimer's diseases . People following the diet may have longer life expectancies and lower rates of chronic diseases  . The Dietary Guidelines for Americans recommends the Mediterranean diet as an eating plan to promote health and prevent disease  What Is the Mediterranean Diet?  . Healthy eating plan based on typical foods and recipes of Mediterranean-style cooking . The diet is primarily a plant based diet; these foods should make up a majority of meals   Starches - Plant based foods should make up a majority of meals - They are an important sources of vitamins, minerals, energy, antioxidants, and fiber - Choose whole grains, foods high in fiber and minimally processed items  - Typical grain sources include wheat, oats, barley, corn, brown rice, bulgar, farro, millet, polenta, couscous  - Various types of beans include chickpeas, lentils, fava beans, black beans, white beans   Fruits  Veggies - Large quantities of antioxidant rich fruits & veggies; 6 or more servings  - Vegetables can be eaten raw or lightly drizzled with oil and cooked  - Vegetables  common to the traditional Mediterranean Diet include: artichokes, arugula, beets, broccoli, brussel sprouts, cabbage, carrots, celery, collard greens, cucumbers, eggplant, kale, leeks, lemons, lettuce, mushrooms, okra, onions, peas, peppers, potatoes, pumpkin, radishes, rutabaga, shallots, spinach, sweet potatoes, turnips, zucchini - Fruits common to the Mediterranean Diet include: apples, apricots, avocados, cherries, clementines, dates, figs, grapefruits, grapes, melons, nectarines, oranges, peaches, pears, pomegranates, strawberries, tangerines  Fats - Replace butter and margarine with healthy oils, such as olive oil, canola oil, and tahini  - Limit nuts to no more than a handful a day  - Nuts include walnuts, almonds, pecans, pistachios, pine nuts  - Limit or avoid candied, honey roasted or heavily salted nuts - Olives are central to the [employer]iterranean diet - can be eaten whole or used in a variety of dishes   Meats Protein - Limiting red meat: no more than a few times a month - When eating red meat: choose lean cuts and keep the portion to the size of deck of cards - Eggs: approx. 0 to 4 times a week  - Fish and lean poultry: at least 2 a week  - Healthy protein sources include, chicken, [place], lean beef, lamb - Increase intake of seafood such as tuna, salmon, trout, mackerel, shrimp, scallops - Avoid or limit high fat processed meats such as sausage and bacon  Dairy - Include moderate amounts of low fat dairy products  - Focus on healthy dairy such as fat free yogurt, skim milk,  low or reduced fat cheese - Limit dairy products higher in fat such as whole or 2% milk, cheese, ice cream  Alcohol - Moderate amounts of red wine is ok  - No more than 5 oz daily for women (all ages) and men older than age 60  - No more than 10 oz of wine daily for men younger than 58  Other - Limit sweets and other desserts  - Use herbs and spices instead of salt to flavor foods  - Herbs and spices common to the  traditional Mediterranean Diet include: basil, bay leaves, chives, cloves, cumin, fennel, garlic, lavender, marjoram, mint, oregano, parsley, pepper, rosemary, sage, savory, sumac, tarragon, thyme   It's not just a diet, it's a lifestyle:  . The Mediterranean diet includes lifestyle factors typical of those in the region  . Foods, drinks and meals are best eaten with others and savored . Daily physical activity is important for overall good health . This could be strenuous exercise like running and aerobics . This could also be more leisurely activities such as walking, housework, yard-work, or taking the stairs . Moderation is the key; a balanced and healthy diet accommodates most foods and drinks . Consider portion sizes and frequency of consumption of certain foods   Meal Ideas & Options:  . Breakfast:  o Whole wheat toast or whole wheat English muffins with peanut butter & hard boiled egg o Steel cut oats topped with apples & cinnamon and skim milk  o Fresh fruit: banana, strawberries, melon, berries, peaches  o Smoothies: strawberries, bananas, greek yogurt, peanut butter o Low fat greek yogurt with blueberries and granola  o Egg white omelet with spinach and mushrooms o Breakfast couscous: whole wheat couscous, apricots, skim milk, cranberries  . Sandwiches:  o Hummus and grilled vegetables (peppers, zucchini, squash) on whole wheat bread   o Grilled chicken on whole wheat pita with lettuce, tomatoes, cucumbers or tzatziki  o Tuna salad on whole wheat bread: tuna salad made with greek yogurt, olives, red peppers, capers, green onions o Garlic rosemary lamb pita: lamb sauted with garlic, rosemary, salt & pepper; add lettuce, cucumber, greek yogurt to pita - flavor with lemon juice and black pepper  . Seafood:  o Mediterranean grilled salmon, seasoned with garlic, basil, parsley, lemon juice and black pepper o Shrimp, lemon, and spinach whole-grain pasta salad made with low fat greek  yogurt  o Seared scallops with lemon orzo  o Seared tuna steaks seasoned salt, pepper, coriander topped with tomato mixture of olives, tomatoes, olive oil, minced garlic, parsley, green onions and cappers  . Meats:  o Herbed greek chicken salad with kalamata olives, cucumber, feta  o Red bell peppers stuffed with spinach, bulgur, lean ground beef (or lentils) & topped with feta   o Kebabs: skewers of chicken, tomatoes, onions, zucchini, squash  o [place] burgers: made with red onions, mint, dill, lemon juice, feta cheese topped with roasted red peppers . Vegetarian o Cucumber salad: cucumbers, artichoke hearts, celery, red onion, feta cheese, tossed in olive oil & lemon juice  o Hummus and whole grain pita points with a greek salad (lettuce, tomato, feta, olives, cucumbers, red onion) o Lentil soup with celery, carrots made with vegetable broth, garlic, salt and pepper  o Tabouli salad: parsley, bulgur, mint, scallions, cucumbers, tomato, radishes, lemon juice, olive oil, salt and pepper.

## 2014-11-12 NOTE — Progress Notes (Signed)
Pre visit review using our clinic review tool, if applicable. No additional management support is needed unless otherwise documented below in the visit note.  Chief Complaint  Patient presents with  . Establish Care    transfer   . Hypothyroidism    HPI: Patient  Angelica Gray  54 y.o. comes in today for new patitent  visit transfer pt DrJenkins and Adline Mangoadonda Campbell l   54 y.o. yo married female mom with hx of Thyroid since 1999  Mom had thyroid disease  And sister .  Same dose for a while .   Had goiter  And onset with a pregnancy stable.  VitD deficient says was on high dose rx for years?  Out of rx for this .  Was tired and rls.   And seem to helps   On fro years.  May do every other week.   Last pill about a month ago and  No otc vitamins .  Llysine for skin fever .   Family hx of  RLS>  Gets cramps at times   Last period year ago . Craving ice for a few months .  Has had  Cramps in legs.    No bloo din stool no hematuria  caffeine gets sx  Numbness   Mom  Had kidney diseae   Has only on kidney that works. Had renal stone.   Health Maintenance  Topic Date Due  . MAMMOGRAM  09/29/2011  . COLONOSCOPY  09/29/2011  . INFLUENZA VACCINE  05/16/2015  . PAP SMEAR  06/18/2016  . TETANUS/TDAP  11/06/2017   Health Maintenance Review LIFESTYLE:  Exercise:  no Tobacco/ETS:no Alcohol: no Sugar beverages:  No   Sleep:  6 hours  Drug use: no Colonoscopy:  No screening .   PAP: 2014 and negative  MAMMO: not utd    ROS:  GEN/ HEENT: No fever, significant weight changes sweats headaches vision problems hearing changes, CV/ PULM; No chest pain shortness of breath cough, syncope,edema  change in exercise tolerance. GI /GU: No adominal pain, vomiting, change in bowel habits. No blood in the stool. No significant GU symptoms. SKIN/HEME: ,no acute skin rashes suspicious lesions or bleeding. No lymphadenopathy, nodules, masses.  NEURO/ PSYCH:  No neurologic signs such as weakness  numbness. No depression anxiety. IMM/ Allergy: No unusual infections.  Allergy .   REST of 12 system review negative except as per HPI   Past Medical History  Diagnosis Date  . Thyroid disease   . Insomnia   . Hyperlipidemia   . Atrophic kidney left  . History of renal stone 2004    Past Surgical History  Procedure Laterality Date  . Nephrectomy      i dont htink she had a nephrectomy WP  . Tubal ligation  2001    Family History  Problem Relation Age of Onset  . Kidney failure    . Hyperlipidemia Mother   . Diabetes Mother     both parents   . Kidney disease Mother   . Diabetes Father   . Thyroid disease Mother   . Thyroid disease Sister   . Heart attack Father     dec age 54    History   Social History  . Marital Status: Married    Spouse Name: N/A    Number of Children: N/A  . Years of Education: N/A   Social History Main Topics  . Smoking status: Never Smoker   . Smokeless tobacco: Never Used  .  Alcohol Use: 0.0 oz/week    0 Not specified per week     Comment: Social Drink  . Drug Use: No  . Sexual Activity: No   Other Topics Concern  . 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 levothyroxine (SYNTHROID, LEVOTHROID) 112 MCG tablet TAKE ONE TABLET BY MOUTH EVERY DAY  . Vitamin D, Ergocalciferol, (DRISDOL) 50000 UNITS CAPS capsule TAKE ONE CAPSULE BY MOUTH EVERY 7 DAYS    EXAM:  BP 112/86 mmHg  Temp(Src) 97.7 F (36.5 C) (Oral)  Ht 5' 5.25" (1.657 m)  Wt 233 lb 3.2 oz (105.779 kg)  BMI 38.53 kg/m2  Body mass index is 38.53 kg/(m^2).  Physical Exam: Vital signs reviewed ZOX:WRUE is a well-developed well-nourished alert cooperative    who appearsr stated age in no acute distress.  HEENT: normocephalic atraumatic , Eyes: PERRL EOM's full, conjunctiva clear,  Nares: paten,t no deformity discharge or tenderness., Ears: no deformity EAC's clear TMs with normal landmarks. Mouth: clear OP, no lesions, edema.  Moist mucous membranes. Dentition in adequate repair. NECK: supple no or bruits. Goiter prominent isthmus no adenopathy CHEST/PULM:  Clear to auscultation and percussion breath sounds equal no wheeze , rales or rhonchi. No chest wall deformities or tenderness. CV: PMI is nondisplaced, S1 S2 no gallops, murmurs, rubs. Peripheral pulses are full without delay ABDOMEN: Bowel sounds normal nontender  No guard or rebound, no hepato splenomegal no CVA tenderness.  . Extremtities:  No clubbing cyanosis or edema, no acute joint swelling or redness no focal atrophy NEURO:  Oriented x3, cranial nerves 3-12 appear to be intact, no obvious focal weakness,gait within normal limits no abnormal reflexes or asymmetrical SKIN: No acute rashes normal turgor, color, no bruising or petechiae. PSYCH: Oriented, good eye contact, no obvious depression anxiety, cognition and judgment appear normal. LN: no cervical  adenopathy   At egg sandwich .( not fasting ) ASSESSMENT AND PLAN:  Discussed the following assessment and plan:  Hypothyroidism, unspecified hypothyroidism type - Plan: CBC with Differential/Platelet, Hemoglobin A1c, Lipid panel, IBC panel, Ferritin, Celiac panel 10, Protein / creatinine ratio, urine, Vit D  25 hydroxy (rtn osteoporosis monitoring)  Cramp of both lower extremities - Plan: CBC with Differential/Platelet, Hemoglobin A1c, Lipid panel, IBC panel, Ferritin, Celiac panel 10, Protein / creatinine ratio, urine, Vit D  25 hydroxy (rtn osteoporosis monitoring)  Hyperglycemia - Plan: CBC with Differential/Platelet, Hemoglobin A1c, Lipid panel, IBC panel, Ferritin, Celiac panel 10, Protein / creatinine ratio, urine, Vit D  25 hydroxy (rtn osteoporosis monitoring)  RLS (restless legs syndrome) - Plan: CBC with Differential/Platelet, Hemoglobin A1c, Lipid  panel, IBC panel, Ferritin, Celiac panel 10, Protein / creatinine ratio, urine, Vit D  25 hydroxy (rtn osteoporosis monitoring)  Vitamin D deficiency - Plan: CBC with Differential/Platelet, Hemoglobin A1c, Lipid panel, IBC panel, Ferritin, Celiac panel 10, Protein / creatinine ratio, urine, Vit D  25 hydroxy (rtn osteoporosis monitoring) Decide on vit d dose after level , iron levels will need colon screen depending on iron levels  Avoid renal toic substance  Based on fam hx and hx of atrophic kidney. Patient Care Team: Madelin Headings, MD as PCP - General (Internal Medicine) Patient Instructions    Checking for anemia  Iron levles and cholesterol and vit d status. Need colon cancer screening   Will plan after getting lab results back .  Healthy lifestyle includes : At least 150 minutes of exercise weeks  , weight at healthy levels, which is usually   BMI 19-25. Avoid trans fats and processed foods;  Increase fresh fruits and veges to 5 servings per day. And avoid sweet beverages including tea and juice. Mediterranean diet with olive oil and nuts have been noted to be heart and brain healthy . Avoid tobacco products . Limit  alcohol to  7 per week for women and 14 servings for men.  Get adequate sleep . Wear seat belts . Don't text and drive .       Why follow it? Research shows. . Those who follow the Mediterranean diet have a reduced risk of heart disease  . The diet is associated with a reduced incidence of Parkinson's and Alzheimer's diseases . People following the diet may have longer life expectancies and lower rates of chronic diseases  . The Dietary Guidelines for Americans recommends the Mediterranean diet as an eating plan to promote health and prevent disease  What Is the Mediterranean Diet?  . Healthy eating plan based on typical foods and recipes of Mediterranean-style cooking . The diet is primarily a plant based diet; these foods should make up a majority of meals    Starches - Plant based foods should make up a majority of meals - They are an important sources of vitamins, minerals, energy, antioxidants, and fiber - Choose whole grains, foods high in fiber and minimally processed items  - Typical grain sources include wheat, oats, barley, corn, brown rice, bulgar, farro, millet, polenta, couscous  - Various types of beans include chickpeas, lentils, fava beans, black beans, white beans   Fruits  Veggies - Large quantities of antioxidant rich fruits & veggies; 6 or more servings  - Vegetables can be eaten raw or lightly drizzled with oil and cooked  - Vegetables common to the traditional Mediterranean Diet include: artichokes, arugula, beets, broccoli, brussel sprouts, cabbage, carrots, celery, collard greens, cucumbers, eggplant, kale, leeks, lemons, lettuce, mushrooms, okra, onions, peas, peppers, potatoes, pumpkin, radishes, rutabaga, shallots, spinach, sweet potatoes, turnips, zucchini - Fruits common to the Mediterranean Diet include: apples, apricots, avocados, cherries, clementines, dates, figs, grapefruits, grapes, melons, nectarines, oranges, peaches, pears, pomegranates, strawberries, tangerines  Fats - Replace butter and margarine with healthy oils, such as olive oil, canola oil, and tahini  - Limit nuts to no more than a handful a day  - Nuts include walnuts, almonds, pecans, pistachios, pine nuts  - Limit or avoid candied, honey roasted or heavily salted nuts - Olives are central to the [employer] - can be eaten whole or used in a variety of dishes   Meats Protein - Limiting red meat: no more than a few times a month - When eating red meat: choose lean cuts and keep the portion to the size of deck of cards - Eggs: approx. 0 to 4 times a week  - Fish and lean poultry: at least 2 a week  - Healthy protein sources include, chicken, [place], lean beef, lamb - Increase intake of seafood such as tuna, salmon, trout, mackerel, shrimp,  scallops - Avoid or limit high fat processed meats such as sausage and bacon  Dairy - Include moderate amounts of low fat dairy products  - Focus on healthy dairy such as fat free yogurt, skim milk, low or reduced  fat cheese - Limit dairy products higher in fat such as whole or 2% milk, cheese, ice cream  Alcohol - Moderate amounts of red wine is ok  - No more than 5 oz daily for women (all ages) and men older than age 21  - No more than 10 oz of wine daily for men younger than 72  Other - Limit sweets and other desserts  - Use herbs and spices instead of salt to flavor foods  - Herbs and spices common to the traditional Mediterranean Diet include: basil, bay leaves, chives, cloves, cumin, fennel, garlic, lavender, marjoram, mint, oregano, parsley, pepper, rosemary, sage, savory, sumac, tarragon, thyme   It's not just a diet, it's a lifestyle:  . The Mediterranean diet includes lifestyle factors typical of those in the region  . Foods, drinks and meals are best eaten with others and savored . Daily physical activity is important for overall good health . This could be strenuous exercise like running and aerobics . This could also be more leisurely activities such as walking, housework, yard-work, or taking the stairs . Moderation is the key; a balanced and healthy diet accommodates most foods and drinks . Consider portion sizes and frequency of consumption of certain foods   Meal Ideas & Options:  . Breakfast:  o Whole wheat toast or whole wheat English muffins with peanut butter & hard boiled egg o Steel cut oats topped with apples & cinnamon and skim milk  o Fresh fruit: banana, strawberries, melon, berries, peaches  o Smoothies: strawberries, bananas, greek yogurt, peanut butter o Low fat greek yogurt with blueberries and granola  o Egg white omelet with spinach and mushrooms o Breakfast couscous: whole wheat couscous, apricots, skim milk, cranberries  . Sandwiches:  o Hummus and  grilled vegetables (peppers, zucchini, squash) on whole wheat bread   o Grilled chicken on whole wheat pita with lettuce, tomatoes, cucumbers or tzatziki  o Tuna salad on whole wheat bread: tuna salad made with greek yogurt, olives, red peppers, capers, green onions o Garlic rosemary lamb pita: lamb sauted with garlic, rosemary, salt & pepper; add lettuce, cucumber, greek yogurt to pita - flavor with lemon juice and black pepper  . Seafood:  o Mediterranean grilled salmon, seasoned with garlic, basil, parsley, lemon juice and black pepper o Shrimp, lemon, and spinach whole-grain pasta salad made with low fat greek yogurt  o Seared scallops with lemon orzo  o Seared tuna steaks seasoned salt, pepper, coriander topped with tomato mixture of olives, tomatoes, olive oil, minced garlic, parsley, green onions and cappers  . Meats:  o Herbed greek chicken salad with kalamata olives, cucumber, feta  o Red bell peppers stuffed with spinach, bulgur, lean ground beef (or lentils) & topped with feta   o Kebabs: skewers of chicken, tomatoes, onions, zucchini, squash  o [place] burgers: made with red onions, mint, dill, lemon juice, feta cheese topped with roasted red peppers . Vegetarian o Cucumber salad: cucumbers, artichoke hearts, celery, red onion, feta cheese, tossed in olive oil & lemon juice  o Hummus and whole grain pita points with a greek salad (lettuce, tomato, feta, olives, cucumbers, red onion) o Lentil soup with celery, carrots made with vegetable broth, garlic, salt and pepper  o Tabouli salad: parsley, bulgur, mint, scallions, cucumbers, tomato, radishes, lemon juice, olive oil, salt and pepper.          Neta Mends. Graysin Luczynski M.D.   40 minutes spent review correction record  Plan  And fu .

## 2014-11-13 LAB — PROTEIN / CREATININE RATIO, URINE
CREATININE, URINE: 93.7 mg/dL
PROTEIN CREATININE RATIO: 0.07 (ref <0.15)
TOTAL PROTEIN, URINE: 7 mg/dL (ref 5–24)

## 2014-11-15 LAB — CELIAC PANEL 10
Endomysial Screen: NEGATIVE
GLIADIN IGG: 1 U (ref <20)
Gliadin IgA: 7 Units (ref <20)
IGA: 326 mg/dL (ref 69–380)
Tissue Transglut Ab: 1 U/mL (ref <6)
Tissue Transglutaminase Ab, IgA: 1 U/mL (ref <4)

## 2014-11-29 ENCOUNTER — Other Ambulatory Visit: Payer: Self-pay | Admitting: Family Medicine

## 2014-11-29 DIAGNOSIS — D649 Anemia, unspecified: Secondary | ICD-10-CM

## 2014-12-02 ENCOUNTER — Other Ambulatory Visit: Payer: Self-pay | Admitting: Family Medicine

## 2014-12-02 DIAGNOSIS — D509 Iron deficiency anemia, unspecified: Secondary | ICD-10-CM

## 2014-12-17 ENCOUNTER — Ambulatory Visit: Payer: BLUE CROSS/BLUE SHIELD | Admitting: Nurse Practitioner

## 2014-12-20 ENCOUNTER — Ambulatory Visit (INDEPENDENT_AMBULATORY_CARE_PROVIDER_SITE_OTHER): Payer: BLUE CROSS/BLUE SHIELD | Admitting: Physician Assistant

## 2014-12-20 ENCOUNTER — Encounter: Payer: Self-pay | Admitting: Physician Assistant

## 2014-12-20 VITALS — BP 116/82 | HR 68 | Ht 65.5 in | Wt 229.2 lb

## 2014-12-20 DIAGNOSIS — D509 Iron deficiency anemia, unspecified: Secondary | ICD-10-CM

## 2014-12-20 MED ORDER — POLYETHYLENE GLYCOL 3350 17 GM/SCOOP PO POWD: ORAL | Status: DC

## 2014-12-20 NOTE — Progress Notes (Signed)
Patient ID: Angelica BaptistCathy B Square, female   DOB: Jul 03, 1961, 54 y.o.   MRN: [id number]   Subjective:    Patient ID: Angelica Gray, female    DOB: Jul 03, 1961, 54 y.o.   MRN: [id number]  HPI Lynden AngCathy is a pleasant 54 year old white female new to GI today referred by Dr. Fabian SharpPanosh for evaluation of a new finding of iron deficiency anemia. Patient states she had initially gone in for evaluation because she was craving ice over the past few months and she knew that could be a sign of iron deficiency. Most recent labs showed hemoglobin of 11, hematocrit of 34, MCV of 70, serum iron 23 ,TIBC of 426 and iron saturation of 3.9, ferritin 4.1 and celiac panel was negative. She has not done Hemoccults. Patient says he only other time she had been anemic was with one of her pregnancies. She went through menopause at age 54 She says she has been feeling fatigued over the past few months and. Unmotivated which is unusual for her. Her appetite is been fine her weight has been stable she has not noted any changes in her bowel habits except perhaps more frequent bowel movements no melena or hematochezia. She has occasional mild reflux symptoms no daily heartburn or indigestion no nausea or vomiting no dysphagia. Family history is negative for colon cancer polyps. No regular aspirin or NSAID use. Patient has been started on a generic iron supplement.  Review of Systems Pertinent positive and negative review of systems were noted in the above HPI section.  All other review of systems was otherwise negative.  Outpatient Encounter Prescriptions as of 12/20/2014  Medication Sig  . Ferrous Sulfate (IRON) 325 (65 FE) MG TABS Take 1 tablet by mouth daily.  Marland Kitchen. levothyroxine (SYNTHROID, LEVOTHROID) 112 MCG tablet TAKE ONE TABLET BY MOUTH EVERY DAY  . Vitamin D, Ergocalciferol, (DRISDOL) 50000 UNITS CAPS capsule TAKE ONE CAPSULE BY MOUTH EVERY 7 DAYS  . polyethylene glycol powder (GLYCOLAX/MIRALAX) powder Take as directed for colonoscopy prep    No Known Allergies Patient Active Problem List   Diagnosis Date Noted  . ACUTE BRONCHITIS 09/14/2010  . HIATAL HERNIA WITH REFLUX 05/17/2010  . CHEST WALL PAIN, ANTERIOR 05/17/2010  . UNSPECIFIED VITAMIN D DEFICIENCY 02/15/2010  . HYPOGLYCEMIA 11/15/2009  . HYDRONEPHROSIS, RIGHT 10/22/2008  . Other and Unspecified Hyperlipidemia 11/07/2007  . RESTLESS LEG SYNDROME, MILD 11/07/2007  . MYOFASCIAL PAIN SYNDROME 11/07/2007  . LIVER FUNCTION TESTS, ABNORMAL 11/07/2007  . HYPOTHYROIDISM 09/23/2007  . DISORDER, MENOPAUSAL NOS 09/23/2007  . MALAISE AND FATIGUE 09/23/2007   History   Social History  . Marital Status: Married    Spouse Name: N/A  . Number of Children: 4  . Years of Education: N/A   Occupational History  . special events planner     [employer]   Social History Main Topics  . Smoking status: Never Smoker   . Smokeless tobacco: Never Used  . Alcohol Use: 0.0 oz/week    0 Standard drinks or equivalent per week     Comment: Social Drink  . Drug Use: No  . Sexual Activity: No   Other Topics Concern  . 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 Goldwater's family history includes Diabetes in her father and mother; Heart attack  in her father; Hyperlipidemia in her mother; Kidney failure in her mother; Throat cancer in her paternal grandmother; Thyroid disease in her mother and sister.      Objective:    Filed Vitals:   12/20/14 0912  BP: 116/82  Pulse: 68    Physical Exam  well-developed white female in no acute distress, quite pleasant blood pressure 116/82 pulse 68 height 5 foot 5 weight 229. HEENT :nontraumatic normocephalic EOMI PERRLA sclera anicteric, Supple :no JVD, Cardiovascular :regular rate and rhythm with S1-S2 no murmur, rub or gallop, Pulmonary: clear bilaterally, Abdomen :obese soft nontender nondistended bowel sounds  are present there is no palpable mass or hepatosplenomegaly, Rectal: exam not done, Extremities: no clubbing cyanosis or edema skin warm and dry, Psych: mood and affect appropriate       Assessment & Plan:   #1 54 yo female with new Iron deficiency anemia . Asymptomatic except for fatigue. R/O occult GI lesion or AVM's  #2 Hypothyroid #3 RLS   Plan; Patient needs to continue oral iron supplementation for at least 4 months then have levels repeated. Would also repeat hemoglobin in about a month. Check Hemosure We'll schedule for colonoscopy and EGD with Dr. Leone Payor. Procedures discussed in detail with patient and she is agreeable to proceed. Further workup pending results of above.   Falyn Rubel S Fumio Vandam PA-C 12/20/2014   Cc: Madelin Headings, MD

## 2014-12-20 NOTE — Patient Instructions (Signed)
You have been scheduled for an endoscopy and colonoscopy. Please follow the written instructions given to you at your visit today. Please pick up your prep supplies at the pharmacy within the next 1-3 days. [facility] [place], [address]  If you use inhalers (even only as needed), please bring them with you on the day of your procedure. Your physician has requested that you go to www.startemmi.com and enter the access code given to you at your visit today. This web site gives a general overview about your procedure. However, you should still follow specific instructions given to you by our office regarding your preparation for the procedure.  You may have a light breakfast the morning of prep day (the day before the procedure 01-31-2015). You may choose from: eggs, toast, chicken noodle soup, crackers.  You should have your breakfast completed between 8:00 and 9:00 am.  Clear liquids only for the rest of the day on prep day and up until 3 hours before procedure.

## 2014-12-24 NOTE — Progress Notes (Signed)
Agree with Ms. Esterwood's assessment and plan. Osha Errico E. Emanuella Nickle, MD, FACG   

## 2014-12-30 ENCOUNTER — Encounter: Payer: Self-pay | Admitting: Internal Medicine

## 2015-01-21 ENCOUNTER — Telehealth: Payer: Self-pay | Admitting: *Deleted

## 2015-01-21 NOTE — Telephone Encounter (Signed)
Received refill request from [facility] for Vitamin D2 50,000 IU Capsules.

## 2015-01-23 NOTE — Telephone Encounter (Signed)
Would like her to try 2000 IU vit d per day  And then check vit d level in 3 month s

## 2015-01-24 NOTE — Telephone Encounter (Signed)
Spoke to the pt and instructed her to take 2000 iu daily and will then recheck vit d in 3 months.

## 2015-01-31 ENCOUNTER — Telehealth: Payer: Self-pay | Admitting: Physician Assistant

## 2015-01-31 NOTE — Telephone Encounter (Signed)
I called the patient back and she said we had given her the prep instructions and also told her to get 2 bottles of magnesium citrate.  I looked all over her office visit and asked Amy and we did not need her to get Mag Citrate.  I dont know if I used a smart phrase to type something and accidentally typed this or not.  I apologized and told her she may be able to return the bottles to the pharmacy.  Also her diagnosis codes did not include constipation.

## 2015-02-01 ENCOUNTER — Encounter: Payer: Self-pay | Admitting: Internal Medicine

## 2015-02-01 ENCOUNTER — Ambulatory Visit (AMBULATORY_SURGERY_CENTER): Payer: BLUE CROSS/BLUE SHIELD | Admitting: Internal Medicine

## 2015-02-01 VITALS — BP 104/71 | HR 68 | Temp 97.8°F | Resp 17 | Ht 65.0 in | Wt 229.0 lb

## 2015-02-01 DIAGNOSIS — K257 Chronic gastric ulcer without hemorrhage or perforation: Secondary | ICD-10-CM

## 2015-02-01 DIAGNOSIS — D509 Iron deficiency anemia, unspecified: Secondary | ICD-10-CM

## 2015-02-01 DIAGNOSIS — R0683 Snoring: Secondary | ICD-10-CM | POA: Insufficient documentation

## 2015-02-01 DIAGNOSIS — D500 Iron deficiency anemia secondary to blood loss (chronic): Secondary | ICD-10-CM

## 2015-02-01 DIAGNOSIS — K219 Gastro-esophageal reflux disease without esophagitis: Secondary | ICD-10-CM | POA: Diagnosis not present

## 2015-02-01 DIAGNOSIS — G2581 Restless legs syndrome: Secondary | ICD-10-CM

## 2015-02-01 HISTORY — DX: Iron deficiency anemia secondary to blood loss (chronic): D50.0

## 2015-02-01 MED ORDER — SODIUM CHLORIDE 0.9 % IV SOLN: 500.0000 mL | INTRAVENOUS | Status: DC

## 2015-02-01 NOTE — Op Note (Signed)
[facility] [address] [place], [id number]   ENDOSCOPY PROCEDURE REPORT  PATIENT: Angelica BaptistDavis, [name]  MR#: [id number] BIRTHDATE: 1961-05-13 , 53  yrs. old GENDER: female ENDOSCOPIST: Iva Booparl E Gessner, MD, [facility] PROCEDURE DATE:  02/01/2015 PROCEDURE:  EGD w/ biopsy ASA CLASS:     Class II INDICATIONS:  iron deficiency anemia. MEDICATIONS: Residual sedation present, Propofol 150 mg IV, and Monitored anesthesia care TOPICAL ANESTHETIC: none  DESCRIPTION OF PROCEDURE: After the risks benefits and alternatives of the procedure were thoroughly explained, informed consent was obtained.  The LB [id number] endoscope was introduced through the mouth and advanced to the second portion of the duodenum , Without limitations.  The instrument was slowly withdrawn as the mucosa was fully examined.    1) 7 cm hiatal hernia with Cameron's erosions seen 2) ? Short-segment Barrett's - 2 areas columnar change at the GE junction - max 5-6 mm - biopsied 3) Otherwise normal.  Retroflexed views revealed as previously described.     The scope was then withdrawn from the patient and the procedure completed.  COMPLICATIONS: There were no immediate complications.  ENDOSCOPIC IMPRESSION: 1) 7 cm hiatal hernia with Cameron's erosions seen - cause of iron-deficiency anemia I think 2) ? Short-segment Barrett's - 2 areas columnar change at the GE junction - max 5-6 mm - biopsied 3) Otherwise normal  RECOMMENDATIONS: 1.  Await pathology results 2.  Office will call with results 3.  Consider sleep evaluation - heavy snoring and severe leg movement during procedures  - will discuss    eSigned:  Iva Booparl E Gessner, MD, [facility] 02/01/2015 4:29 PM    CC:The Patient and Berniece AndreasWanda Panosh, MD

## 2015-02-01 NOTE — Progress Notes (Signed)
Patient awakening,vss,report to rn 

## 2015-02-01 NOTE — Op Note (Signed)
[facility] [address] [place], [id number]   COLONOSCOPY PROCEDURE REPORT  PATIENT: Angelica BaptistDavis, Adileny B  MR#: [id number] BIRTHDATE: Mar 20, 1961 , 53  [facility] GENDER: female ENDOSCOPIST: Iva Booparl E Konstantinos Cordoba, MD, [facility] PROCEDURE DATE:  02/01/2015 PROCEDURE:   Colonoscopy, diagnostic and Colonoscopy with biopsy First Screening Colonoscopy - Avg.  risk and is 50 yrs.  old or older - No.  Prior Negative Screening - Now for repeat screening. N/A ASA CLASS:   Class II INDICATIONS:Unexplained iron deficiency anemia and Colorectal Neoplasm Risk Assessment for this procedure is average risk. MEDICATIONS: Propofol 200 mg IV and Monitored anesthesia care  DESCRIPTION OF PROCEDURE:   After the risks benefits and alternatives of the procedure were thoroughly explained, informed consent was obtained.  The digital rectal exam revealed no abnormalities of the rectum.   The LB [id number]  endoscope was introduced through the anus and advanced to the cecum, which was identified by both the appendix and ileocecal valve. No adverse events experienced.   The quality of the prep was excellent. (MiraLax was used)  The instrument was then slowly withdrawn as the colon was fully examined.      COLON FINDINGS: 1) ? Proctitis distal rectum - biopsies taken 2) Sigmoid diverticulosis 3) Otherwise normal colonoscopy.  rectal retroflexion as described above.. The time to cecum = 1.7 Withdrawal time = 7.4   The scope was withdrawn and the procedure completed. COMPLICATIONS: There were no immediate complications.  ENDOSCOPIC IMPRESSION: 1) ? Proctitis distal rectum - biopsies taken 2) Sigmoid diverticulosis 3) Otherwise normal colonoscopy  RECOMMENDATIONS: 1.  Await biopsy results 2.  Upper endoscopy next - see that report  eSigned:  Iva Booparl E Rashell Shambaugh, MD, [facility] 02/01/2015 4:24 PM   cc: The Patient and Berniece AndreasWanda Panosh, MD

## 2015-02-01 NOTE — Progress Notes (Signed)
Called to room to assist during endoscopic procedure.  Patient ID and intended procedure confirmed with present staff. Received instructions for my participation in the procedure from the performing physician.  

## 2015-02-01 NOTE — Patient Instructions (Addendum)
I think the cause of your low iron is the hiatal hernia. 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 The only way to fix this is with surgery. Iron supplementation forever can overcome this in many cases (the chronic slow blood loss).  You may have a condition called Barrett's esophagus - precancerous changes of the esophagus lining. I took biopsies to see. It is very uncommon for this to turn into cancer but if present will need monitoring with endoscopy.  The colonoscopy showed some red lining changes in the rectum - probably from the prep but I took biopsies to see if it was from undlerlying inflammation.  You also have a condition called diverticulosis - common and not usually a problem. Please read the handout provided.  Your legs were very restless and you were snoring while sedated - I think you should most likely see a sleep specialist. I can arrange that or you can talk to dr. Fabian Sharp about this.  I will call with results and recommendations.  I appreciate the opportunity to care for you. Iva Boop, MD, FACG YOU HAD AN ENDOSCOPIC PROCEDURE TODAY AT THE [facility]:   Refer to the procedure report that was given to you for any specific questions about what was found during the examination.  If the procedure report does not answer your questions, please call your gastroenterologist to clarify.  If you requested that your care partner not be given the details of your procedure findings, then the procedure report has been included in a sealed envelope for you to review at your convenience later.  YOU SHOULD EXPECT: Some feelings of bloating in the abdomen. Passage of more gas than usual.  Walking can help get rid of the air that was put into your GI tract during the procedure and reduce the bloating. If you had a lower endoscopy (such as a colonoscopy or flexible sigmoidoscopy)  you may notice spotting of blood in your stool or on the toilet paper. If you underwent a bowel prep for your procedure, you may not have a normal bowel movement for a few days.  Please Note:  You might notice some irritation and congestion in your nose or some drainage.  This is from the oxygen used during your procedure.  There is no need for concern and it should clear up in a day or so.  SYMPTOMS TO REPORT IMMEDIATELY:   Following lower endoscopy (colonoscopy or flexible sigmoidoscopy):  Excessive amounts of blood in the stool  Significant tenderness or worsening of abdominal pains  Swelling of the abdomen that is new, acute  Fever of 100F or higher   Following upper endoscopy (EGD)  Vomiting of blood or coffee ground material  New chest pain or pain under the shoulder blades  Painful or persistently difficult swallowing  New shortness of breath  Fever of 100F or higher  Black, tarry-looking stools  For urgent or emergent issues, a gastroenterologist can be reached at any hour by calling [phone].   DIET: Your first meal following the procedure should be a small meal and then it is ok to progress to your normal diet. Heavy or fried foods are harder to digest and may make you feel nauseous or bloated.  Likewise, meals heavy in dairy and vegetables can increase bloating.  Drink plenty of fluids but you should avoid alcoholic beverages for 24 hours.  ACTIVITY:  You should plan to take it easy for the rest of today and you should NOT DRIVE or use heavy machinery until tomorrow (because of the sedation medicines used during the test).    FOLLOW UP: Our staff will call the number listed on your records the next business day following your procedure to check on you and address any questions or concerns that you may have regarding the information given to you following your procedure. If we do not reach you, we will leave a message.  However, if you are feeling well and you are not  experiencing any problems, there is no need to return our call.  We will assume that you have returned to your regular daily activities without incident.  If any biopsies were taken you will be contacted by phone or by letter within the next 1-3 weeks.  Please call us at [phone] if you have not heard about the biopsies in 3 weeks.    SIGNATURES/CONFIDENTIALITY: You and/or your care partner have signed paperwork which will be entered into your electronic medical record.  These signatures attest to the fact that that the information above on your After Visit Summary has been reviewed and is understood.  Full responsibility of the confidentiality of this discharge information lies with you and/or your care-partner.  haital hernia and diverticulosis information given.

## 2015-02-02 ENCOUNTER — Telehealth: Payer: Self-pay

## 2015-02-02 NOTE — Telephone Encounter (Signed)
  Follow up Call-  Call back number 02/01/2015  Post procedure Call Back phone  # [phone]  Permission to leave phone message Yes     Patient questions:  Do you have a fever, pain , or abdominal swelling? No. Pain Score  0 *  Have you tolerated food without any problems? Yes.    Have you been able to return to your normal activities? Yes.    Do you have any questions about your discharge instructions: Diet   No. Medications  No. Follow up visit  No.  Do you have questions or concerns about your Care? No.  Actions: * If pain score is 4 or above: No action needed, pain <4.

## 2015-02-03 ENCOUNTER — Other Ambulatory Visit (INDEPENDENT_AMBULATORY_CARE_PROVIDER_SITE_OTHER): Payer: BLUE CROSS/BLUE SHIELD

## 2015-02-03 DIAGNOSIS — D649 Anemia, unspecified: Secondary | ICD-10-CM | POA: Diagnosis not present

## 2015-02-04 LAB — CBC WITH DIFFERENTIAL/PLATELET
BASOS ABS: 0.1 10*3/uL (ref 0.0–0.1)
BASOS PCT: 1.4 % (ref 0.0–3.0)
EOS PCT: 1.5 % (ref 0.0–5.0)
Eosinophils Absolute: 0.1 10*3/uL (ref 0.0–0.7)
HCT: 40.2 % (ref 36.0–46.0)
Hemoglobin: 13.5 g/dL (ref 12.0–15.0)
LYMPHS ABS: 1.6 10*3/uL (ref 0.7–4.0)
LYMPHS PCT: 31.6 % (ref 12.0–46.0)
MCHC: 33.6 g/dL (ref 30.0–36.0)
MCV: 82.4 fl (ref 78.0–100.0)
MONO ABS: 0.3 10*3/uL (ref 0.1–1.0)
MONOS PCT: 7 % (ref 3.0–12.0)
NEUTROS ABS: 2.9 10*3/uL (ref 1.4–7.7)
NEUTROS PCT: 58.5 % (ref 43.0–77.0)
Platelets: 207 10*3/uL (ref 150.0–400.0)
RBC: 4.87 Mil/uL (ref 3.87–5.11)
RDW: 22.5 % — ABNORMAL HIGH (ref 11.5–15.5)
WBC: 4.9 10*3/uL (ref 4.0–10.5)

## 2015-02-04 LAB — FERRITIN: FERRITIN: 16.1 ng/mL (ref 10.0–291.0)

## 2015-02-10 ENCOUNTER — Ambulatory Visit (INDEPENDENT_AMBULATORY_CARE_PROVIDER_SITE_OTHER): Payer: BLUE CROSS/BLUE SHIELD | Admitting: Internal Medicine

## 2015-02-10 ENCOUNTER — Encounter: Payer: Self-pay | Admitting: Internal Medicine

## 2015-02-10 VITALS — BP 120/86 | Temp 98.1°F | Ht 65.25 in | Wt 232.0 lb

## 2015-02-10 DIAGNOSIS — D509 Iron deficiency anemia, unspecified: Secondary | ICD-10-CM

## 2015-02-10 DIAGNOSIS — K449 Diaphragmatic hernia without obstruction or gangrene: Secondary | ICD-10-CM | POA: Diagnosis not present

## 2015-02-10 DIAGNOSIS — R0683 Snoring: Secondary | ICD-10-CM

## 2015-02-10 DIAGNOSIS — N261 Atrophy of kidney (terminal): Secondary | ICD-10-CM | POA: Insufficient documentation

## 2015-02-10 DIAGNOSIS — E559 Vitamin D deficiency, unspecified: Secondary | ICD-10-CM

## 2015-02-10 DIAGNOSIS — G2581 Restless legs syndrome: Secondary | ICD-10-CM

## 2015-02-10 NOTE — Progress Notes (Signed)
Pre visit review using our clinic review tool, if applicable. No additional management support is needed unless otherwise documented below in the visit note.  Chief Complaint  Patient presents with  . Follow-up    HPI: Angelica Gray 53 y.o.   Comes in for fu o Mild fe defic anemai and hyperglycemia not diabetes   Has had gi eval see below. Taking iron once a dya no se noted  Som improvement in rls not as tired and not as much ice craving .   Proctitis   Colon and endo  1) 7 cm hiatal hernia with Cameron's erosions seen - cause of iron-deficiency anemia I think 2) ? Short-segment Barrett's - 2 areas columnar change at the GE junction - max 5-6 mm - biopsied 3) Otherwise normal RECOMMENDATIONS: 1. Await pathology results 2. Office will call with results 3. Consider sleep evaluation - heavy snoring and severe leg movement during procedures - will discuss ROS: See pertinent positives and negatives per HPI.  fam hx mom had dm and ht and was on dialysis  Deceased in 78 s   Past Medical History  Diagnosis Date  . Hypothyroidism   . Insomnia   . Hyperlipidemia   . Atrophic kidney left  . History of renal stone 2004  . Iron deficiency anemia   . RESTLESS LEG SYNDROME, MILD 11/07/2007    Qualifier: Diagnosis of  By: Lovell Sheehan MD, Balinda Quails   . 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 02/01/2015    Family History  Problem Relation Age of Onset  . Kidney failure Mother   . Hyperlipidemia Mother   . Diabetes Mother   . Thyroid disease Mother   . Diabetes Father   . Heart attack Father     dec age 31  . Thyroid disease Sister   . Throat cancer Paternal Grandmother     dipped stuff  . Esophageal cancer Paternal Grandmother   . Colon cancer Neg Hx   . Stomach cancer Neg Hx     History   Social History  . Marital Status: Married    Spouse Name: N/A  . Number of Children: 4  . Years of Education: N/A   Occupational History  . special events planner      [employer]   Social History Main Topics  . Smoking status: Never Smoker   . Smokeless tobacco: Never Used  . Alcohol Use: 0.0 oz/week    0 Standard drinks or equivalent per week     Comment: Social Drink  . Drug Use: No  . Sexual Activity: No   Other Topics Concern  . 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 Ferrous Sulfate (IRON) 325 (65 FE) MG TABS Take 1 tablet by mouth daily.  Marland Kitchen levothyroxine (SYNTHROID, LEVOTHROID) 112 MCG tablet TAKE ONE TABLET BY MOUTH EVERY DAY  . Vitamin D, Ergocalciferol, (DRISDOL) 50000 UNITS CAPS capsule TAKE ONE CAPSULE BY MOUTH EVERY 7 DAYS    EXAM:  BP 120/86 mmHg  Temp(Src) 98.1 F (36.7 C) (Oral)  Ht 5' 5.25" (1.657 m)  Wt 232 lb (105.235 kg)  BMI 38.33 kg/m2  Body mass index is 38.33 kg/(m^2).  GENERAL: vitals reviewed  and listed above, alert, oriented, appears well hydrated and in no acute distress HEENT: atraumatic, conjunctiva  clear, no obvious abnormalities on inspection of external nose and ears NECK: no obvious masses on inspection palpation  LUNGS: clear to auscultation bilaterally, no wheezes, rales or rhonchi, good air movement CV: HRRR, no clubbing cyanosis or  peripheral edema nl cap refill  MS: moves all extremities without noticeable focal  abnormality PSYCH: pleasant and cooperative, no obvious depression or anxiety Lab Results  Component Value Date   WBC 4.9 02/03/2015   HGB 13.5 02/03/2015   HCT 40.2 02/03/2015   PLT 207.0 02/03/2015   GLUCOSE 72 10/13/2014   CHOL 175 11/12/2014   TRIG 111.0 11/12/2014   HDL 50.80 11/12/2014   LDLDIRECT 122.0 02/15/2010   LDLCALC 102* 11/12/2014   ALT 20 10/13/2014   AST 18 10/13/2014   NA 142 10/13/2014   K 4.3 10/13/2014   CL 108 10/13/2014   CREATININE 1.1 10/13/2014   BUN 18  10/13/2014   CO2 26 10/13/2014   TSH 1.21 10/13/2014   INR 1.02 04/18/2010   HGBA1C 6.3 11/12/2014   Wt Readings from Last 3 Encounters:  02/10/15 232 lb (105.235 kg)  02/01/15 229 lb (103.874 kg)  12/20/14 229 lb 4 oz (103.987 kg)   BP Readings from Last 3 Encounters:  02/10/15 120/86  02/01/15 104/71  12/20/14 116/82     ASSESSMENT AND PLAN:  Discussed the following assessment and plan:  Anemia, iron deficiency - improved poss from cameron lesions aggravating sleep and fatigue  RLS (restless legs syndrome) - with snoring poss osa  - Plan: Ambulatory referral to Pulmonology  Snoring pos osa - noted ehavy during endo  suggest sleep referralel - Plan: Ambulatory referral to Pulmonology  Hiatal hernia - with significant sx   Vitamin D deficiency - transition  to 2000  per day /pt says suppl  helps her legs also   Atrophy of left kidney   Anemia better  Iron level coming up.  Disc whealthy weight loss  Has hx of a med caused se RLS and  Diet pill  Would not advise offered dietary consult  But will wait at this time  And ho given  -Patient advised to return or notify health care team  if symptoms worsen ,persist or new concerns arise.  Patient Instructions    Iron anemia is much better but plan on continuing  Iron supplement .   To help restless leg. .  Will plan referral  For  Sleep .  You could have sleep apnea AND  RLS.  Plan  Vit d   cbcdiff ferritin  and bmp and hga1c   In 4 months.  And then rov.   Weight loss can help the medical condition.       Why follow it? Research shows. . Those who follow the Mediterranean diet have a reduced risk of heart disease  . The diet is associated with a reduced incidence of Parkinson's and Alzheimer's diseases . People following the diet may have longer life expectancies and lower rates of chronic diseases  . The Dietary Guidelines for Americans recommends the Mediterranean diet as an eating plan to promote health and prevent  disease  What Is the Mediterranean Diet?  . Healthy eating plan based on typical foods and recipes of Mediterranean-style cooking . The diet is primarily a plant based diet; these foods should make up a majority of meals   Starches - Plant based foods should make  up a majority of meals - They are an important sources of vitamins, minerals, energy, antioxidants, and fiber - Choose whole grains, foods high in fiber and minimally processed items  - Typical grain sources include wheat, oats, barley, corn, brown rice, bulgar, farro, millet, polenta, couscous  - Various types of beans include chickpeas, lentils, fava beans, black beans, white beans   Fruits  Veggies - Large quantities of antioxidant rich fruits & veggies; 6 or more servings  - Vegetables can be eaten raw or lightly drizzled with oil and cooked  - Vegetables common to the traditional Mediterranean Diet include: artichokes, arugula, beets, broccoli, brussel sprouts, cabbage, carrots, celery, collard greens, cucumbers, eggplant, kale, leeks, lemons, lettuce, mushrooms, okra, onions, peas, peppers, potatoes, pumpkin, radishes, rutabaga, shallots, spinach, sweet potatoes, turnips, zucchini - Fruits common to the Mediterranean Diet include: apples, apricots, avocados, cherries, clementines, dates, figs, grapefruits, grapes, melons, nectarines, oranges, peaches, pears, pomegranates, strawberries, tangerines  Fats - Replace butter and margarine with healthy oils, such as olive oil, canola oil, and tahini  - Limit nuts to no more than a handful a day  - Nuts include walnuts, almonds, pecans, pistachios, pine nuts  - Limit or avoid candied, honey roasted or heavily salted nuts - Olives are central to the [employer]iterranean diet - can be eaten whole or used in a variety of dishes   Meats Protein - Limiting red meat: no more than a few times a month - When eating red meat: choose lean cuts and keep the portion to the size of deck of cards - Eggs:  approx. 0 to 4 times a week  - Fish and lean poultry: at least 2 a week  - Healthy protein sources include, chicken, [place]key, lean beef, lamb - Increase intake of seafood such as tuna, salmon, trout, mackerel, shrimp, scallops - Avoid or limit high fat processed meats such as sausage and bacon  Dairy - Include moderate amounts of low fat dairy products  - Focus on healthy dairy such as fat free yogurt, skim milk, low or reduced fat cheese - Limit dairy products higher in fat such as whole or 2% milk, cheese, ice cream  Alcohol - Moderate amounts of red wine is ok  - No more than 5 oz daily for women (all ages) and men older than age 54  - No more than 10 oz of wine daily for men younger than 5265  Other - Limit sweets and other desserts  - Use herbs and spices instead of salt to flavor foods  - Herbs and spices common to the traditional Mediterranean Diet include: basil, bay leaves, chives, cloves, cumin, fennel, garlic, lavender, marjoram, mint, oregano, parsley, pepper, rosemary, sage, savory, sumac, tarragon, thyme   It's not just a diet, it's a lifestyle:  . The Mediterranean diet includes lifestyle factors typical of those in the region  . Foods, drinks and meals are best eaten with others and savored . Daily physical activity is important for overall good health . This could be strenuous exercise like running and aerobics . This could also be more leisurely activities such as walking, housework, yard-work, or taking the stairs . Moderation is the key; a balanced and healthy diet accommodates most foods and drinks . Consider portion sizes and frequency of consumption of certain foods   Meal Ideas & Options:  . Breakfast:  o Whole wheat toast or whole wheat English muffins with peanut butter & hard boiled egg o Steel cut oats topped with apples &  cinnamon and skim milk  o Fresh fruit: banana, strawberries, melon, berries, peaches  o Smoothies: strawberries, bananas, greek yogurt, peanut  butter o Low fat greek yogurt with blueberries and granola  o Egg white omelet with spinach and mushrooms o Breakfast couscous: whole wheat couscous, apricots, skim milk, cranberries  . Sandwiches:  o Hummus and grilled vegetables (peppers, zucchini, squash) on whole wheat bread   o Grilled chicken on whole wheat pita with lettuce, tomatoes, cucumbers or tzatziki  o Tuna salad on whole wheat bread: tuna salad made with greek yogurt, olives, red peppers, capers, green onions o Garlic rosemary lamb pita: lamb sauted with garlic, rosemary, salt & pepper; add lettuce, cucumber, greek yogurt to pita - flavor with lemon juice and black pepper  . Seafood:  o Mediterranean grilled salmon, seasoned with garlic, basil, parsley, lemon juice and black pepper o Shrimp, lemon, and spinach whole-grain pasta salad made with low fat greek yogurt  o Seared scallops with lemon orzo  o Seared tuna steaks seasoned salt, pepper, coriander topped with tomato mixture of olives, tomatoes, olive oil, minced garlic, parsley, green onions and cappers  . Meats:  o Herbed greek chicken salad with kalamata olives, cucumber, feta  o Red bell peppers stuffed with spinach, bulgur, lean ground beef (or lentils) & topped with feta   o Kebabs: skewers of chicken, tomatoes, onions, zucchini, squash  o [place] burgers: made with red onions, mint, dill, lemon juice, feta cheese topped with roasted red peppers . Vegetarian o Cucumber salad: cucumbers, artichoke hearts, celery, red onion, feta cheese, tossed in olive oil & lemon juice  o Hummus and whole grain pita points with a greek salad (lettuce, tomato, feta, olives, cucumbers, red onion) o Lentil soup with celery, carrots made with vegetable broth, garlic, salt and pepper  o Tabouli salad: parsley, bulgur, mint, scallions, cucumbers, tomato, radishes, lemon juice, olive oil, salt and pepper.          Food Choices for Gastroesophageal Reflux Disease When you have  gastroesophageal reflux disease (GERD), the foods you eat and your eating habits are very important. Choosing the right foods can help ease the discomfort of GERD. WHAT GENERAL GUIDELINES DO I NEED TO FOLLOW?  Choose fruits, vegetables, whole grains, low-fat dairy products, and low-fat meat, fish, and poultry.  Limit fats such as oils, salad dressings, butter, nuts, and avocado.  Keep a food diary to identify foods that cause symptoms.  Avoid foods that cause reflux. These may be different for different people.  Eat frequent small meals instead of three large meals each day.  Eat your meals slowly, in a relaxed setting.  Limit fried foods.  Cook foods using methods other than frying.  Avoid drinking alcohol.  Avoid drinking large amounts of liquids with your meals.  Avoid bending over or lying down until 2-3 hours after eating. WHAT FOODS ARE NOT RECOMMENDED? The following are some foods and drinks that may worsen your symptoms: Vegetables Tomatoes. Tomato juice. Tomato and spaghetti sauce. Chili peppers. Onion and garlic. Horseradish. Fruits Oranges, grapefruit, and lemon (fruit and juice). Meats High-fat meats, fish, and poultry. This includes hot dogs, ribs, ham, sausage, salami, and bacon. Dairy Whole milk and chocolate milk. Sour cream. Cream. Butter. Ice cream. Cream cheese.  Beverages Coffee and tea, with or without caffeine. Carbonated beverages or energy drinks. Condiments Hot sauce. Barbecue sauce.  Sweets/Desserts Chocolate and cocoa. Donuts. Peppermint and spearmint. Fats and Oils High-fat foods, including [place] fries and potato chips. Other Vinegar.  Strong spices, such as black pepper, white pepper, red pepper, cayenne, curry powder, cloves, ginger, and chili powder. The items listed above may not be a complete list of foods and beverages to avoid. Contact your dietitian for more information. Document Released: 10/01/2005 Document Revised: 10/06/2013  Document Reviewed: 08/05/2013 [facility] Patient Information 2015 [place], [place]. This information is not intended to replace advice given to you by your health care provider. Make sure you discuss any questions you have with your health care provider.         Neta Mends. Julene Rahn M.D.

## 2015-02-10 NOTE — Patient Instructions (Signed)
Iron anemia is much better but plan on continuing  Iron supplement .   To help restless leg. .  Will plan referral  For  Sleep .  You could have sleep apnea AND  RLS.  Plan  Vit d   cbcdiff ferritin  and bmp and hga1c   In 4 months.  And then rov.   Weight loss can help the medical condition.       Why follow it? Research shows. . Those who follow the Mediterranean diet have a reduced risk of heart disease  . The diet is associated with a reduced incidence of Parkinson's and Alzheimer's diseases . People following the diet may have longer life expectancies and lower rates of chronic diseases  . The Dietary Guidelines for Americans recommends the Mediterranean diet as an eating plan to promote health and prevent disease  What Is the Mediterranean Diet?  . Healthy eating plan based on typical foods and recipes of Mediterranean-style cooking . The diet is primarily a plant based diet; these foods should make up a majority of meals   Starches - Plant based foods should make up a majority of meals - They are an important sources of vitamins, minerals, energy, antioxidants, and fiber - Choose whole grains, foods high in fiber and minimally processed items  - Typical grain sources include wheat, oats, barley, corn, brown rice, bulgar, farro, millet, polenta, couscous  - Various types of beans include chickpeas, lentils, fava beans, black beans, white beans   Fruits  Veggies - Large quantities of antioxidant rich fruits & veggies; 6 or more servings  - Vegetables can be eaten raw or lightly drizzled with oil and cooked  - Vegetables common to the traditional Mediterranean Diet include: artichokes, arugula, beets, broccoli, brussel sprouts, cabbage, carrots, celery, collard greens, cucumbers, eggplant, kale, leeks, lemons, lettuce, mushrooms, okra, onions, peas, peppers, potatoes, pumpkin, radishes, rutabaga, shallots, spinach, sweet potatoes, turnips, zucchini - Fruits common to the  Mediterranean Diet include: apples, apricots, avocados, cherries, clementines, dates, figs, grapefruits, grapes, melons, nectarines, oranges, peaches, pears, pomegranates, strawberries, tangerines  Fats - Replace butter and margarine with healthy oils, such as olive oil, canola oil, and tahini  - Limit nuts to no more than a handful a day  - Nuts include walnuts, almonds, pecans, pistachios, pine nuts  - Limit or avoid candied, honey roasted or heavily salted nuts - Olives are central to the [employer]iterranean diet - can be eaten whole or used in a variety of dishes   Meats Protein - Limiting red meat: no more than a few times a month - When eating red meat: choose lean cuts and keep the portion to the size of deck of cards - Eggs: approx. 0 to 4 times a week  - Fish and lean poultry: at least 2 a week  - Healthy protein sources include, chicken, [place], lean beef, lamb - Increase intake of seafood such as tuna, salmon, trout, mackerel, shrimp, scallops - Avoid or limit high fat processed meats such as sausage and bacon  Dairy - Include moderate amounts of low fat dairy products  - Focus on healthy dairy such as fat free yogurt, skim milk, low or reduced fat cheese - Limit dairy products higher in fat such as whole or 2% milk, cheese, ice cream  Alcohol - Moderate amounts of red wine is ok  - No more than 5 oz daily for women (all ages) and men older than age 54  - No more than 10 oz  of wine daily for men younger than 7  Other - Limit sweets and other desserts  - Use herbs and spices instead of salt to flavor foods  - Herbs and spices common to the traditional Mediterranean Diet include: basil, bay leaves, chives, cloves, cumin, fennel, garlic, lavender, marjoram, mint, oregano, parsley, pepper, rosemary, sage, savory, sumac, tarragon, thyme   It's not just a diet, it's a lifestyle:  . The Mediterranean diet includes lifestyle factors typical of those in the region  . Foods, drinks and meals are  best eaten with others and savored . Daily physical activity is important for overall good health . This could be strenuous exercise like running and aerobics . This could also be more leisurely activities such as walking, housework, yard-work, or taking the stairs . Moderation is the key; a balanced and healthy diet accommodates most foods and drinks . Consider portion sizes and frequency of consumption of certain foods   Meal Ideas & Options:  . Breakfast:  o Whole wheat toast or whole wheat English muffins with peanut butter & hard boiled egg o Steel cut oats topped with apples & cinnamon and skim milk  o Fresh fruit: banana, strawberries, melon, berries, peaches  o Smoothies: strawberries, bananas, greek yogurt, peanut butter o Low fat greek yogurt with blueberries and granola  o Egg white omelet with spinach and mushrooms o Breakfast couscous: whole wheat couscous, apricots, skim milk, cranberries  . Sandwiches:  o Hummus and grilled vegetables (peppers, zucchini, squash) on whole wheat bread   o Grilled chicken on whole wheat pita with lettuce, tomatoes, cucumbers or tzatziki  o Tuna salad on whole wheat bread: tuna salad made with greek yogurt, olives, red peppers, capers, green onions o Garlic rosemary lamb pita: lamb sauted with garlic, rosemary, salt & pepper; add lettuce, cucumber, greek yogurt to pita - flavor with lemon juice and black pepper  . Seafood:  o Mediterranean grilled salmon, seasoned with garlic, basil, parsley, lemon juice and black pepper o Shrimp, lemon, and spinach whole-grain pasta salad made with low fat greek yogurt  o Seared scallops with lemon orzo  o Seared tuna steaks seasoned salt, pepper, coriander topped with tomato mixture of olives, tomatoes, olive oil, minced garlic, parsley, green onions and cappers  . Meats:  o Herbed greek chicken salad with kalamata olives, cucumber, feta  o Red bell peppers stuffed with spinach, bulgur, lean ground beef (or  lentils) & topped with feta   o Kebabs: skewers of chicken, tomatoes, onions, zucchini, squash  o [place] burgers: made with red onions, mint, dill, lemon juice, feta cheese topped with roasted red peppers . Vegetarian o Cucumber salad: cucumbers, artichoke hearts, celery, red onion, feta cheese, tossed in olive oil & lemon juice  o Hummus and whole grain pita points with a greek salad (lettuce, tomato, feta, olives, cucumbers, red onion) o Lentil soup with celery, carrots made with vegetable broth, garlic, salt and pepper  o Tabouli salad: parsley, bulgur, mint, scallions, cucumbers, tomato, radishes, lemon juice, olive oil, salt and pepper.          Food Choices for Gastroesophageal Reflux Disease When you have gastroesophageal reflux disease (GERD), the foods you eat and your eating habits are very important. Choosing the right foods can help ease the discomfort of GERD. WHAT GENERAL GUIDELINES DO I NEED TO FOLLOW?  Choose fruits, vegetables, whole grains, low-fat dairy products, and low-fat meat, fish, and poultry.  Limit fats such as oils, salad dressings, butter, nuts, and  avocado.  Keep a food diary to identify foods that cause symptoms.  Avoid foods that cause reflux. These may be different for different people.  Eat frequent small meals instead of three large meals each day.  Eat your meals slowly, in a relaxed setting.  Limit fried foods.  Cook foods using methods other than frying.  Avoid drinking alcohol.  Avoid drinking large amounts of liquids with your meals.  Avoid bending over or lying down until 2-3 hours after eating. WHAT FOODS ARE NOT RECOMMENDED? The following are some foods and drinks that may worsen your symptoms: Vegetables Tomatoes. Tomato juice. Tomato and spaghetti sauce. Chili peppers. Onion and garlic. Horseradish. Fruits Oranges, grapefruit, and lemon (fruit and juice). Meats High-fat meats, fish, and poultry. This includes hot dogs, ribs,  ham, sausage, salami, and bacon. Dairy Whole milk and chocolate milk. Sour cream. Cream. Butter. Ice cream. Cream cheese.  Beverages Coffee and tea, with or without caffeine. Carbonated beverages or energy drinks. Condiments Hot sauce. Barbecue sauce.  Sweets/Desserts Chocolate and cocoa. Donuts. Peppermint and spearmint. Fats and Oils High-fat foods, including [place] fries and potato chips. Other Vinegar. Strong spices, such as black pepper, white pepper, red pepper, cayenne, curry powder, cloves, ginger, and chili powder. The items listed above may not be a complete list of foods and beverages to avoid. Contact your dietitian for more information. Document Released: 10/01/2005 Document Revised: 10/06/2013 Document Reviewed: 08/05/2013 [facility] Patient Information 2015 [place], [place]. This information is not intended to replace advice given to you by your health care provider. Make sure you discuss any questions you have with your health care provider.

## 2015-02-10 NOTE — Progress Notes (Signed)
Quick Note:  Please tell her bxs are inflammation in stomach and no problem in rectum - I think prep created changes there  1) pantoprazole 40 mg qd before breakfast # 90 3 RF 2) See me next available (about 2 months) - will review hiatal hernia problems and discuss possible surgical referral 3) refer to pulmonary - sleep MD re: restless legs and heavy snoring when sedated - we discussed after procedures and she oked this   [facility]  No letter Place 10 yr colon recall ______

## 2015-02-11 ENCOUNTER — Other Ambulatory Visit: Payer: Self-pay

## 2015-02-11 MED ORDER — PANTOPRAZOLE SODIUM 40 MG PO TBEC: 40.0000 mg | DELAYED_RELEASE_TABLET | Freq: Every day | ORAL | Status: DC

## 2015-03-27 ENCOUNTER — Other Ambulatory Visit: Payer: Self-pay | Admitting: Family

## 2015-03-28 NOTE — Telephone Encounter (Signed)
Refilled in Jan for 6 months.  Good through July. Pt to return in Aug.  Filled for 90 days.

## 2015-04-13 ENCOUNTER — Institutional Professional Consult (permissible substitution): Payer: BLUE CROSS/BLUE SHIELD | Admitting: Pulmonary Disease

## 2015-04-26 ENCOUNTER — Encounter: Payer: Self-pay | Admitting: Internal Medicine

## 2015-04-26 ENCOUNTER — Other Ambulatory Visit (INDEPENDENT_AMBULATORY_CARE_PROVIDER_SITE_OTHER): Payer: BLUE CROSS/BLUE SHIELD

## 2015-04-26 ENCOUNTER — Ambulatory Visit (INDEPENDENT_AMBULATORY_CARE_PROVIDER_SITE_OTHER): Payer: BLUE CROSS/BLUE SHIELD | Admitting: Internal Medicine

## 2015-04-26 VITALS — BP 110/80 | HR 60 | Ht 65.0 in | Wt 236.6 lb

## 2015-04-26 DIAGNOSIS — D500 Iron deficiency anemia secondary to blood loss (chronic): Secondary | ICD-10-CM | POA: Diagnosis not present

## 2015-04-26 DIAGNOSIS — K257 Chronic gastric ulcer without hemorrhage or perforation: Secondary | ICD-10-CM

## 2015-04-26 DIAGNOSIS — K449 Diaphragmatic hernia without obstruction or gangrene: Secondary | ICD-10-CM

## 2015-04-26 LAB — CBC WITH DIFFERENTIAL/PLATELET
BASOS PCT: 0.5 % (ref 0.0–3.0)
Basophils Absolute: 0 10*3/uL (ref 0.0–0.1)
EOS ABS: 0.1 10*3/uL (ref 0.0–0.7)
Eosinophils Relative: 2 % (ref 0.0–5.0)
HCT: 42 % (ref 36.0–46.0)
Hemoglobin: 14.3 g/dL (ref 12.0–15.0)
LYMPHS ABS: 1.3 10*3/uL (ref 0.7–4.0)
Lymphocytes Relative: 24.1 % (ref 12.0–46.0)
MCHC: 34 g/dL (ref 30.0–36.0)
MCV: 89 fl (ref 78.0–100.0)
Monocytes Absolute: 0.4 10*3/uL (ref 0.1–1.0)
Monocytes Relative: 7.7 % (ref 3.0–12.0)
NEUTROS ABS: 3.4 10*3/uL (ref 1.4–7.7)
Neutrophils Relative %: 65.7 % (ref 43.0–77.0)
Platelets: 209 10*3/uL (ref 150.0–400.0)
RBC: 4.73 Mil/uL (ref 3.87–5.11)
RDW: 13.7 % (ref 11.5–15.5)
WBC: 5.2 10*3/uL (ref 4.0–10.5)

## 2015-04-26 LAB — FERRITIN: Ferritin: 64.1 ng/mL (ref 10.0–291.0)

## 2015-04-26 NOTE — Assessment & Plan Note (Signed)
[facility] referral

## 2015-04-26 NOTE — Assessment & Plan Note (Signed)
CNBC Ferritin [facility] referral

## 2015-04-26 NOTE — Progress Notes (Signed)
Quick Note:  Iron level better ______

## 2015-04-26 NOTE — Progress Notes (Signed)
   Subjective:    Patient ID: Angelica Gray, female    DOB: 06-09-1961, 54 y.o.   MRN: [id number] Chief complaint: Follow-up hiatal hernia and Lysbeth Galas erosions and iron deficiency anemia HPI The patient is here reporting she feels somewhat better on a PPI. Less epigastric and lower chest discomfort. Recent EGD showed a large hiatal hernia as was shown on 2011 CT scan and Cameron's ulcers. She is on iron and PPI. Medications, allergies, past medical history, past surgical history, family history and social history are reviewed and updated in the EMR.  Review of Systems As above    Objective:   Physical Exam BP 110/80 mmHg  Pulse 60  Ht _0  (1.651 m)  Wt 236 lb 9.6 oz (107.321 kg)  BMI 39.37 kg/m2 Obese pleasant white woman in no acute distress     Assessment & Plan:  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 CNBC Ferritin [facility] referral  Hiatal hernia UGI [facility] referral  Cameron lesion, chronic [facility] referral   I have discussed how permanent repair of the hiatal hernia was surgery would resolve symptoms and her anemia problems. We have reviewed the nature of the procedure and surgery in some degree and a handout was provided regarding laparoscopic fundoplication. She will be referred to Dr. Zella Richer. An upper GI series will be done in anticipation of that. She could need an esophageal manometry if Dr. Zella Richer desires that prior to surgery and I explained that to her as well.  She has signs and symptoms of sleep apnea, she was late for her pulmonary appointment here and was rescheduled for October, she met someone who works at the sleep center in [place] that she knows and plans to go there for evaluation.  I appreciate the opportunity to care for this patient. CC: Lottie Dawson, MD Dr. Jackolyn Confer

## 2015-04-26 NOTE — Patient Instructions (Addendum)
  Your physician has requested that you go to the basement for the following lab work before leaving today: CBC/diff, Ferritin  You have been scheduled for an appointment with _Dr Avel Peaceodd Rosenbower_ at [facility]. Your appointment is on _8/19/16__ at _3:00pm__. Please arrive at ___2:30pm__ for registration. Make certain to bring a list of current medications, including any over the counter medications or vitamins. Also bring your co-pay if you have one as well as your insurance cards. [facility] is located at [address], [address]. Should you need to reschedule your appointment, please contact them at [phone].    You have been scheduled for an Upper GI Series at [facility]. Your appointment is on 05/20/2015 at 8:30AM. Please arrive 15 minutes prior to your test for registration. Make sure not to eat or drink anything after midnight on the night before your test. If you need to reschedule, please call radiology at [phone]. ________________________________________________________________ An upper GI series uses x rays to help diagnose problems of the upper GI tract, which includes the esophagus, stomach, and duodenum. The duodenum is the first part of the small intestine. An upper GI series is conducted by a radiology technologist or a radiologist-a doctor who specializes in x-ray imaging-at a hospital or outpatient center. While sitting or standing in front of an x-ray machine, the patient drinks barium liquid, which is often white and has a chalky consistency and taste. The barium liquid coats the lining of the upper GI tract and makes signs of disease show up more clearly on x rays. X-ray video, called fluoroscopy, is used to view the barium liquid moving through the esophagus, stomach, and duodenum. Additional x rays and fluoroscopy are performed while the patient lies on an x-ray table. To fully coat the upper GI tract with barium liquid, the technologist  or radiologist may press on the abdomen or ask the patient to change position. Patients hold still in various positions, allowing the technologist or radiologist to take x rays of the upper GI tract at different angles. If a technologist conducts the upper GI series, a radiologist will later examine the images to look for problems.  This test typically takes about 1 hour to complete. __________________________________________________________________   I appreciate the opportunity to care for you. Stan Headarl Gessner, MD, [facility]    04/27/15  Called and LM on pts mobile # with [facility] appt date/time.  They will mail her information out.

## 2015-04-26 NOTE — Assessment & Plan Note (Signed)
UGI [facility] referral

## 2015-04-29 ENCOUNTER — Telehealth: Payer: Self-pay

## 2015-04-29 NOTE — Telephone Encounter (Signed)
-----   Message from Iva Booparl E Gessner, MD sent at 04/28/2015  6:57 PM EDT ----- Regarding: FW: ? needs manometry   ----- Message -----    From: Avel Peaceodd Rosenbower, MD    Sent: 04/27/2015   8:34 AM      To: Iva Booparl E Gessner, MD Subject: RE: ? needs manometry                          Baldo Asharl,  Thanks for the referral.  I will see her then decide if we need manometry.  TR ----- Message -----    From: Iva Booparl E Gessner, MD    Sent: 04/26/2015   5:14 PM      To: Avel Peaceodd Rosenbower, MD Subject: ? needs manometry                              I am referring this lady with a large HH and Cameron's erosions causing anemia for evaluation by you.  EGD done UGI ordered Had a CT 2011  Your staff says manometry necessary for appt. I told her she might need that depending upon your eval - I can order it but did not want to do so if for some reason she did not proceed with surgery (though I suspect she will)  No problem to set it up now if you want but i would like to get her on your schedule  Thanks  Baldo AshCarl

## 2015-04-29 NOTE — Telephone Encounter (Signed)
Spoke with Sheria Langameron at [facility] and patient has appointment made for 06/03/15 at 3:00pm, arrive 2:30pm with Dr Abbey Chattersosenbower.  Left this information on patients cell voice mail and they will mail her out papers as well.

## 2015-05-20 ENCOUNTER — Ambulatory Visit (HOSPITAL_COMMUNITY)
Admission: RE | Admit: 2015-05-20 | Discharge: 2015-05-20 | Disposition: A | Discharge status: Home / Self Care | Payer: BLUE CROSS/BLUE SHIELD | Source: Ambulatory Visit | Attending: Internal Medicine | Admitting: Internal Medicine

## 2015-05-20 DIAGNOSIS — K469 Unspecified abdominal hernia without obstruction or gangrene: Secondary | ICD-10-CM | POA: Diagnosis present

## 2015-05-20 DIAGNOSIS — K449 Diaphragmatic hernia without obstruction or gangrene: Secondary | ICD-10-CM

## 2015-05-23 NOTE — Progress Notes (Signed)
Quick Note:  Let her know that this study confirms she will need the hernia repaired.  She is seeing Dr. Abbey Chatters - double check that she has an appt. ______

## 2015-06-02 ENCOUNTER — Other Ambulatory Visit (INDEPENDENT_AMBULATORY_CARE_PROVIDER_SITE_OTHER): Payer: BLUE CROSS/BLUE SHIELD

## 2015-06-02 ENCOUNTER — Other Ambulatory Visit: Payer: Self-pay | Admitting: Family Medicine

## 2015-06-02 DIAGNOSIS — N261 Atrophy of kidney (terminal): Secondary | ICD-10-CM

## 2015-06-02 DIAGNOSIS — D509 Iron deficiency anemia, unspecified: Secondary | ICD-10-CM | POA: Diagnosis not present

## 2015-06-02 DIAGNOSIS — E559 Vitamin D deficiency, unspecified: Secondary | ICD-10-CM | POA: Diagnosis not present

## 2015-06-02 DIAGNOSIS — R739 Hyperglycemia, unspecified: Secondary | ICD-10-CM

## 2015-06-02 LAB — CBC WITH DIFFERENTIAL/PLATELET
BASOS PCT: 0.3 % (ref 0.0–3.0)
Basophils Absolute: 0 10*3/uL (ref 0.0–0.1)
EOS PCT: 1.3 % (ref 0.0–5.0)
Eosinophils Absolute: 0.1 10*3/uL (ref 0.0–0.7)
HCT: 41.5 % (ref 36.0–46.0)
HEMOGLOBIN: 14.1 g/dL (ref 12.0–15.0)
Lymphocytes Relative: 20.3 % (ref 12.0–46.0)
Lymphs Abs: 1.2 10*3/uL (ref 0.7–4.0)
MCHC: 34 g/dL (ref 30.0–36.0)
MCV: 89.9 fl (ref 78.0–100.0)
MONOS PCT: 7.4 % (ref 3.0–12.0)
Monocytes Absolute: 0.4 10*3/uL (ref 0.1–1.0)
Neutro Abs: 4.2 10*3/uL (ref 1.4–7.7)
Neutrophils Relative %: 70.7 % (ref 43.0–77.0)
Platelets: 185 10*3/uL (ref 150.0–400.0)
RBC: 4.61 Mil/uL (ref 3.87–5.11)
RDW: 13 % (ref 11.5–15.5)
WBC: 6 10*3/uL (ref 4.0–10.5)

## 2015-06-02 LAB — IBC PANEL
Iron: 115 ug/dL (ref 42–145)
Saturation Ratios: 32.2 % (ref 20.0–50.0)
Transferrin: 255 mg/dL (ref 212.0–360.0)

## 2015-06-02 LAB — BASIC METABOLIC PANEL
BUN: 16 mg/dL (ref 6–23)
CO2: 27 mEq/L (ref 19–32)
CREATININE: 1.01 mg/dL (ref 0.40–1.20)
Calcium: 9.3 mg/dL (ref 8.4–10.5)
Chloride: 105 mEq/L (ref 96–112)
GFR: 60.78 mL/min (ref >60.00)
Glucose, Bld: 130 mg/dL — ABNORMAL HIGH (ref 70–99)
POTASSIUM: 4.1 meq/L (ref 3.5–5.1)
Sodium: 139 mEq/L (ref 135–145)

## 2015-06-02 LAB — VITAMIN D 25 HYDROXY (VIT D DEFICIENCY, FRACTURES): VITD: 31.83 ng/mL (ref 30.00–100.00)

## 2015-06-02 LAB — HEMOGLOBIN A1C: HEMOGLOBIN A1C: 5.8 % (ref 4.6–6.5)

## 2015-06-03 ENCOUNTER — Other Ambulatory Visit: Payer: BLUE CROSS/BLUE SHIELD

## 2015-06-10 ENCOUNTER — Encounter: Payer: Self-pay | Admitting: Internal Medicine

## 2015-06-10 ENCOUNTER — Ambulatory Visit (INDEPENDENT_AMBULATORY_CARE_PROVIDER_SITE_OTHER): Payer: BLUE CROSS/BLUE SHIELD | Admitting: Internal Medicine

## 2015-06-10 VITALS — BP 124/80 | Temp 98.0°F | Wt 236.2 lb

## 2015-06-10 DIAGNOSIS — D509 Iron deficiency anemia, unspecified: Secondary | ICD-10-CM

## 2015-06-10 DIAGNOSIS — E559 Vitamin D deficiency, unspecified: Secondary | ICD-10-CM

## 2015-06-10 DIAGNOSIS — R109 Unspecified abdominal pain: Secondary | ICD-10-CM

## 2015-06-10 DIAGNOSIS — R0683 Snoring: Secondary | ICD-10-CM

## 2015-06-10 DIAGNOSIS — Z2821 Immunization not carried out because of patient refusal: Secondary | ICD-10-CM

## 2015-06-10 DIAGNOSIS — Z6839 Body mass index (BMI) 39.0-39.9, adult: Secondary | ICD-10-CM

## 2015-06-10 DIAGNOSIS — R739 Hyperglycemia, unspecified: Secondary | ICD-10-CM

## 2015-06-10 DIAGNOSIS — K449 Diaphragmatic hernia without obstruction or gangrene: Secondary | ICD-10-CM

## 2015-06-10 LAB — POCT URINALYSIS DIP (MANUAL ENTRY)
Bilirubin, UA: NEGATIVE
Blood, UA: NEGATIVE
GLUCOSE UA: NEGATIVE
Ketones, POC UA: NEGATIVE
LEUKOCYTES UA: NEGATIVE
NITRITE UA: NEGATIVE
PROTEIN UA: NEGATIVE
Spec Grav, UA: 1.025
UROBILINOGEN UA: 0.2
pH, UA: 6

## 2015-06-10 NOTE — Patient Instructions (Addendum)
Get the sleep evaluation and  Get [place] a copy of eval. This is probably main reason you are tired. Will be contacted about  Nutrition referral    Avoid simple carbs   Breads cold cereal  Rice pizza etc .   [protein for breakfast and may feel better and  Help lose weight.  Your iron levels are better  . Urine is clear  Plan fu after evaluation.   Or 6 months

## 2015-06-10 NOTE — Progress Notes (Signed)
Pre visit review using our clinic review tool, if applicable. No additional management support is needed unless otherwise documented below in the visit note.  Chief Complaint  Patient presents with  . Follow-up    anemia BG other    HPI: Angelica Gray 54 y.o.  comes in for chronic disease/ medication management  Fu  Iron anemia Has postponed sleep evaluation  But when had endo said  Stopped breathing she would prefer to see specialist closer to home and will make appt.  Sometimes gets shaky after eating  Eats cold cereal for breakfast   Had endo had told had cameron erosions and advised surgical correction for [facility] Still tired   Today right flank pain like when had renal stone   No extra activity  No fever hematuria ut sx  No rx yet just started  ROS: See pertinent positives and negatives per HPI.  Past Medical History  Diagnosis Date  . Hypothyroidism   . Insomnia   . Hyperlipidemia   . Atrophic kidney left  . History of renal stone 2004  . Iron deficiency anemia   . RESTLESS LEG SYNDROME, MILD 11/07/2007    Qualifier: Diagnosis of  By: Lovell Sheehan MD, Balinda Quails   . 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 02/01/2015    Family History  Problem Relation Age of Onset  . Kidney failure Mother     from dm and ht  had 11 children   . Hyperlipidemia Mother   . Diabetes Mother   . Thyroid disease Mother   . Diabetes Father   . Heart attack Father     dec age 57  . Thyroid disease Sister   . Throat cancer Paternal Grandmother     dipped stuff  . Esophageal cancer Paternal Grandmother   . Colon cancer Neg Hx   . Stomach cancer Neg Hx     Social History   Social History  . Marital Status: Married    Spouse Name: N/A  . Number of Children: 4  . Years of Education: N/A   Occupational History  . special events planner     [employer]   Social History Main Topics  . Smoking status: Never Smoker   . Smokeless tobacco: Never Used  . Alcohol  Use: 0.0 oz/week    0 Standard drinks or equivalent per week     Comment: Social Drink  . Drug Use: No  . Sexual Activity: No   Other Topics Concern  . 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 Ferrous Sulfate (IRON) 325 (65 FE) MG TABS Take 1 tablet by mouth daily.    Marland Kitchen levothyroxine (SYNTHROID, LEVOTHROID) 112 MCG tablet TAKE ONE TABLET BY MOUTH ONCE DAILY 90 tablet 0  . pantoprazole (PROTONIX) 40 MG tablet Take 1 tablet (40 mg total) by mouth daily. 90 tablet 3  . vitamin E 400 UNIT capsule Take 400 Units by mouth daily.     No facility-administered medications prior to visit.     EXAM:  BP 124/80 mmHg  Temp(Src) 98 F (36.7 C) (Oral)  Wt 236 lb 3.2 oz (107.14 kg)  Body mass index is 39.31 kg/(m^2).  GENERAL: vitals  reviewed and listed above, alert, oriented, appears well hydrated and in no acute distress HEENT: atraumatic, conjunctiva  clear, no obvious abnormalities on inspection of external nose and ears NECK: no obvious masses on inspection palpation  LUNGS: clear to auscultation bilaterally, no wheezes, rales or rhonchi, good air movement abd soft  Right flank mildly  Tender   No rash no midline tenderness slight uncomfortable  CV: HRRR, no clubbing cyanosis or  peripheral edema nl cap refill  MS: moves all extremities without noticeable focal  abnormality PSYCH: pleasant and cooperative, no obvious depression or anxiety Lab Results  Component Value Date   WBC 6.0 06/02/2015   HGB 14.1 06/02/2015   HCT 41.5 06/02/2015   PLT 185.0 06/02/2015   GLUCOSE 130* 06/02/2015   CHOL 175 11/12/2014   TRIG 111.0 11/12/2014   HDL 50.80 11/12/2014   LDLDIRECT 122.0 02/15/2010   LDLCALC 102* 11/12/2014   ALT 20 10/13/2014   AST 18 10/13/2014   NA 139 06/02/2015   K 4.1 06/02/2015    CL 105 06/02/2015   CREATININE 1.01 06/02/2015   BUN 16 06/02/2015   CO2 27 06/02/2015   TSH 1.21 10/13/2014   INR 1.02 04/18/2010   HGBA1C 5.8 06/02/2015   Wt Readings from Last 3 Encounters:  06/10/15 236 lb 3.2 oz (107.14 kg)  04/26/15 236 lb 9.6 oz (107.321 kg)  02/10/15 232 lb (105.235 kg)   BP Readings from Last 3 Encounters:  06/10/15 124/80  04/26/15 110/80  02/10/15 120/86   Lab Results  Component Value Date   FERRITIN 64.1 04/26/2015   ua is clear  ASSESSMENT AND PLAN:  Discussed the following assessment and plan:  Right flank pain - ? ms cause hx of renal stone  see care if worse etc  - Plan: POCT urinalysis dipstick  Anemia, iron deficiency - better  Snoring pos osa - prob cause of fatigue  she promises to get evaluation  Hyperglycemia - lsi advised and follow  - Plan: Amb ref to Medical Nutrition Therapy-MNT  Vitamin D deficiency - better   Hiatal hernia - with cameron erosions  Influenza vaccination declined  BMI 39.0-39.9,adult - Plan: Amb ref to Medical Nutrition Therapy-MNT  -Patient advised to return or notify health care team  if symptoms worsen ,persist or new concerns arise.  Patient Instructions  Get the sleep evaluation and  Get [place] a copy of eval. This is probably main reason you are tired. Will be contacted about  Nutrition referral    Avoid simple carbs   Breads cold cereal  Rice pizza etc .   [protein for breakfast and may feel better and  Help lose weight.  Your iron levels are better  . Urine is clear  Plan fu after evaluation.   Or 6 months      Angelica Gray. Angelica Gray M.D.  Health Maintenance Due  Topic Date Due  . Hepatitis C Screening  [birth year]  . HIV Screening  09/28/1976  . MAMMOGRAM  09/29/2011

## 2015-06-11 DIAGNOSIS — R739 Hyperglycemia, unspecified: Secondary | ICD-10-CM | POA: Insufficient documentation

## 2015-07-21 ENCOUNTER — Other Ambulatory Visit: Payer: Self-pay | Admitting: Internal Medicine

## 2015-07-21 ENCOUNTER — Encounter: Payer: Self-pay | Admitting: Internal Medicine

## 2015-07-22 ENCOUNTER — Telehealth: Payer: Self-pay | Admitting: Family Medicine

## 2015-07-22 MED ORDER — LEVOTHYROXINE SODIUM 112 MCG PO TABS: 112.0000 ug | ORAL_TABLET | Freq: Every day | ORAL | Status: DC

## 2015-07-22 NOTE — Telephone Encounter (Signed)
Ok x 90 days  May be due for labs at that time

## 2015-07-22 NOTE — Telephone Encounter (Signed)
Sent to the pharmacy by e-scribe. 

## 2015-07-22 NOTE — Telephone Encounter (Signed)
Should pt schedule cpx?

## 2015-07-22 NOTE — Telephone Encounter (Signed)
Pt due for lab work in 90 days.  What labs do you want ordered?

## 2015-07-25 NOTE — Telephone Encounter (Signed)
My notes say rov in 6 months   Looks like  Then will be due for   Lipid panel , hga1c bmp l  cbcdiff , ferritin.   Lab Results  Component Value Date   WBC 6.0 06/02/2015   HGB 14.1 06/02/2015   HCT 41.5 06/02/2015   PLT 185.0 06/02/2015   GLUCOSE 130* 06/02/2015   CHOL 175 11/12/2014   TRIG 111.0 11/12/2014   HDL 50.80 11/12/2014   LDLDIRECT 122.0 02/15/2010   LDLCALC 102* 11/12/2014   ALT 20 10/13/2014   AST 18 10/13/2014   NA 139 06/02/2015   K 4.1 06/02/2015   CL 105 06/02/2015   CREATININE 1.01 06/02/2015   BUN 16 06/02/2015   CO2 27 06/02/2015   TSH 1.21 10/13/2014   INR 1.02 04/18/2010   HGBA1C 5.8 06/02/2015   Lab Results  Component Value Date   FERRITIN 64.1 04/26/2015

## 2015-07-28 ENCOUNTER — Other Ambulatory Visit: Payer: Self-pay

## 2015-07-28 ENCOUNTER — Ambulatory Visit
Admission: RE | Admit: 2015-07-28 | Discharge: 2015-07-28 | Disposition: A | Discharge status: Home / Self Care | Payer: BLUE CROSS/BLUE SHIELD | Source: Ambulatory Visit

## 2015-07-28 ENCOUNTER — Other Ambulatory Visit (HOSPITAL_COMMUNITY): Payer: Self-pay | Admitting: Surgery

## 2015-07-28 ENCOUNTER — Other Ambulatory Visit: Payer: Self-pay | Admitting: Family Medicine

## 2015-07-28 DIAGNOSIS — N261 Atrophy of kidney (terminal): Secondary | ICD-10-CM

## 2015-07-28 DIAGNOSIS — E7800 Pure hypercholesterolemia, unspecified: Secondary | ICD-10-CM

## 2015-07-28 DIAGNOSIS — D509 Iron deficiency anemia, unspecified: Secondary | ICD-10-CM

## 2015-07-28 DIAGNOSIS — E162 Hypoglycemia, unspecified: Secondary | ICD-10-CM

## 2015-07-28 DIAGNOSIS — Z1231 Encounter for screening mammogram for malignant neoplasm of breast: Secondary | ICD-10-CM

## 2015-07-28 NOTE — Telephone Encounter (Signed)
Orders placed in the system.  Pt to return in Feb.

## 2015-08-01 ENCOUNTER — Encounter: Payer: Self-pay | Admitting: Dietician

## 2015-08-01 ENCOUNTER — Other Ambulatory Visit: Payer: Self-pay | Admitting: Internal Medicine

## 2015-08-01 ENCOUNTER — Encounter: Payer: BLUE CROSS/BLUE SHIELD | Attending: Surgery | Admitting: Dietician

## 2015-08-01 VITALS — Ht 66.5 in | Wt 235.1 lb

## 2015-08-01 DIAGNOSIS — R928 Other abnormal and inconclusive findings on diagnostic imaging of breast: Secondary | ICD-10-CM

## 2015-08-01 DIAGNOSIS — Z713 Dietary counseling and surveillance: Secondary | ICD-10-CM | POA: Insufficient documentation

## 2015-08-01 DIAGNOSIS — E669 Obesity, unspecified: Secondary | ICD-10-CM | POA: Insufficient documentation

## 2015-08-01 DIAGNOSIS — Z6837 Body mass index (BMI) 37.0-37.9, adult: Secondary | ICD-10-CM | POA: Diagnosis not present

## 2015-08-01 NOTE — Patient Instructions (Addendum)
Follow Pre-Op Goals Try Protein Shakes Call [facility] at [phone] when surgery is scheduled to enroll in Pre-Op Class You will need to take 2 x the adult dose of a chewable complete multivitamin (flinstons complete), 1500 mg of calcium citrate (take 500 mg 3 x day) Celebrate from [facility], and 350-500 mcg of Vitamin B12 (dissolve under your tongue).  Things to remember:  Please always be honest with Angelica Gray. We want to support you!  If you have any questions or concerns in between appointments, please call or email Angelica Gray, Angelica Gray, or Angelica ConesLaurie.  The diet after surgery will be high protein and low in carbohydrate.  Vitamins and calcium need to be taken for the rest of your life.  Feel free to include support people in any classes or appointments.

## 2015-08-01 NOTE — Progress Notes (Signed)
  Pre-Op Assessment Visit:  Pre-Operative RYGB Surgery  Medical Nutrition Therapy:  Appt start time: 1635   End time:  1735.  Patient was seen on 08/01/2015 for Pre-Operative Nutrition Assessment. Assessment and [facility] coordinator on 08/01/2015.   Preferred Learning Style:   No preference indicated   Learning Readiness:   Ready  Handouts given during visit include:  Pre-Op Goals Bariatric Surgery Protein Shakes   During the appointment today the following Pre-Op Goals were reviewed with the patient: Maintain or lose weight as instructed by your surgeon Make healthy food choices Begin to limit portion sizes Limited concentrated sugars and fried foods Keep fat/sugar in the single digits per serving on   food labels Practice CHEWING your food  (aim for 30 chews per bite or until applesauce consistency) Practice not drinking 15 minutes before, during, and 30 minutes after each meal/snack Avoid all carbonated beverages  Avoid/limit caffeinated beverages  Avoid all sugar-sweetened beverages Consume 3 meals per day; eat every 3-5 hours Make a list of non-food related activities Aim for 64-100 ounces of FLUID daily  Aim for at least 60-80 grams of PROTEIN daily Look for a liquid protein source that contain ?15 g protein and ?5 g carbohydrate  (ex: shakes, drinks, shots)  Patient-Centered Goals: Goals: would like to start running 10 level of confidence/9 level of importance scale  Demonstrated degree of understanding via:  Teach Back  Teaching Method Utilized:  Visual Auditory Hands on  Barriers to learning/adherence to lifestyle change: none  Patient to call the [facility] to enroll in Pre-Op and Post-Op Nutrition Education when surgery date is scheduled.

## 2015-08-03 ENCOUNTER — Ambulatory Visit (HOSPITAL_COMMUNITY)
Admission: RE | Admit: 2015-08-03 | Discharge: 2015-08-03 | Disposition: A | Discharge status: Home / Self Care | Payer: BLUE CROSS/BLUE SHIELD | Source: Ambulatory Visit | Attending: Surgery | Admitting: Surgery

## 2015-08-03 ENCOUNTER — Ambulatory Visit
Admission: RE | Admit: 2015-08-03 | Discharge: 2015-08-03 | Disposition: A | Discharge status: Home / Self Care | Payer: BLUE CROSS/BLUE SHIELD | Source: Ambulatory Visit | Attending: Internal Medicine | Admitting: Internal Medicine

## 2015-08-03 ENCOUNTER — Other Ambulatory Visit (HOSPITAL_COMMUNITY): Payer: BLUE CROSS/BLUE SHIELD

## 2015-08-03 ENCOUNTER — Other Ambulatory Visit: Payer: BLUE CROSS/BLUE SHIELD

## 2015-08-03 DIAGNOSIS — Z01818 Encounter for other preprocedural examination: Secondary | ICD-10-CM | POA: Insufficient documentation

## 2015-08-03 DIAGNOSIS — R928 Other abnormal and inconclusive findings on diagnostic imaging of breast: Secondary | ICD-10-CM

## 2015-08-03 DIAGNOSIS — R9431 Abnormal electrocardiogram [ECG] [EKG]: Secondary | ICD-10-CM | POA: Diagnosis not present

## 2015-08-08 ENCOUNTER — Ambulatory Visit: Payer: BLUE CROSS/BLUE SHIELD

## 2015-08-08 DIAGNOSIS — E669 Obesity, unspecified: Secondary | ICD-10-CM | POA: Diagnosis not present

## 2015-08-09 NOTE — Progress Notes (Signed)
  Pre-Operative Nutrition Class:  Appt start time: 4932   End time:  1830.  Patient was seen on 08/09/15 for Pre-Operative Bariatric Surgery Education at the [facility].   Surgery date:  Surgery type: RYGB Start weight at [facility]: 235 lbs on 08/01/15 Weight today: 234 lbs  TANITA  BODY COMP RESULTS  08/08/15   BMI (kg/m^2) 37.2   Fat Mass (lbs) 107.5   Fat Free Mass (lbs) 126.5   Total Body Water (lbs) 92.5   Samples given per MNT protocol. Patient educated on appropriate usage: Celebrate Multivitamin chew (berry - qty 1) Lot #: [id number] Exp: 12/2016  Celebrate Calcium Citrate chew (caramel - qty 1) Lot #: [id number] Exp:12/2016  PB2 (chocolate - qty 1) Lot #: N/[id number] Exp: 08/2015  Renee Pain Protein Powder (chicken soup - qty 1) Lot #: [id number] Exp: 04/2016  The following the learning objectives were met by the patient during this course:  Identify Pre-Op Dietary Goals and will begin 2 weeks pre-operatively  Identify appropriate sources of fluids and proteins   State protein recommendations and appropriate sources pre and post-operatively  Identify Post-Operative Dietary Goals and will follow for 2 weeks post-operatively  Identify appropriate multivitamin and calcium sources  Describe the need for physical activity post-operatively and will follow MD recommendations  State when to call healthcare provider regarding medication questions or post-operative complications  Handouts given during class include:  Pre-Op Bariatric Surgery Diet Handout  Protein Shake Handout  Post-Op Bariatric Surgery Nutrition Handout  BELT Program Information Flyer  Support Group Information Flyer  [facility] Bariatric Supplements Price List  Follow-Up Plan: Patient will follow-up at [facility] 2 weeks post operatively for diet advancement per MD.

## 2015-08-25 ENCOUNTER — Other Ambulatory Visit: Payer: Self-pay | Admitting: Surgery

## 2015-08-31 ENCOUNTER — Other Ambulatory Visit: Payer: Self-pay | Admitting: Surgery

## 2015-09-01 NOTE — Patient Instructions (Addendum)
Jola BaptistCathy B Lacewell  09/01/2015   Your procedure is scheduled on: 09/05/2015    [facility]  [facility]  [facility] to 3rd floor to  Short Stay Center at   1230pm.  Call this number if you have problems the morning of surgery [phone]   Remember: ONLY 1 PERSON MAY GO WITH YOU TO SHORT STAY TO GET  READY MORNING OF YOUR SURGERY.  Do not eat food after midnite.  May have clear liquids from 12 midnite until 0730am then nothing by mouth.     Take these medicines the morning of surgery with A SIP OF WATER: Synthroid, Protonix                                 You may not have any metal on your body including hair pins and              piercings  Do not wear jewelry, make-up, lotions, powders or perfumes, deodorant             Do not wear nail polish.  Do not shave  48 hours prior to surgery.               Do not bring valuables to the hospital. [facility] IS NOT             RESPONSIBLE   FOR VALUABLES.  Contacts, dentures or bridgework may not be worn into surgery.  Leave suitcase in the car. After surgery it may be brought to your room.       Special Instructions: coughing and deep breathing exercises, leg exercises    CLEAR LIQUID DIET   Foods Allowed                                                                     Foods Excluded  Coffee and tea, regular and decaf                             liquids that you cannot  Plain Jell-O in any flavor                                             see through such as: Fruit ices (not with fruit pulp)                                     milk, soups, orange juice  Iced Popsicles                                    All solid food Carbonated beverages, regular and diet  Cranberry, grape and apple juices Sports drinks like Gatorade Lightly seasoned clear broth or consume(fat free) Sugar, honey syrup  Sample Menu Breakfast                                Lunch                                      Supper Cranberry juice                    Beef broth                            Chicken broth Jell-O                                     Grape juice                           Apple juice Coffee or tea                        Jell-O                                      Popsicle                                                Coffee or tea                        Coffee or tea  _____________________________________________________________________               Please read over the following fact sheets you were given: _____________________________________________________________________             [facility] - Preparing for Surgery Before surgery, you can play an important role.  Because skin is not sterile, your skin needs to be as free of germs as possible.  You can reduce the number of germs on your skin by washing with CHG (chlorahexidine gluconate) soap before surgery.  CHG is an antiseptic cleaner which kills germs and bonds with the skin to continue killing germs even after washing. Please DO NOT use if you have an allergy to CHG or antibacterial soaps.  If your skin becomes reddened/irritated stop using the CHG and inform your nurse when you arrive at Short Stay. Do not shave (including legs and underarms) for at least 48 hours prior to the first CHG shower.  You may shave your face/neck. Please follow these instructions carefully:  1.  Shower with CHG Soap the night before surgery and the  morning of Surgery.  2.  If you choose to wash your hair, wash your hair first as usual with your  normal  shampoo.  3.  After you shampoo, rinse your hair and body thoroughly to remove the  shampoo.  4.  Use CHG as you would any other liquid soap.  You can apply chg directly  to the skin and wash                       Gently with a scrungie or clean washcloth.  5.  Apply the CHG Soap to your body ONLY FROM THE NECK DOWN.   Do not use on face/  open                           Wound or open sores. Avoid contact with eyes, ears mouth and genitals (private parts).                       Wash face,  Genitals (private parts) with your normal soap.             6.  Wash thoroughly, paying special attention to the area where your surgery  will be performed.  7.  Thoroughly rinse your body with warm water from the neck down.  8.  DO NOT shower/wash with your normal soap after using and rinsing off  the CHG Soap.                9.  Pat yourself dry with a clean towel.            10.  Wear clean pajamas.            11.  Place clean sheets on your bed the night of your first shower and do not  sleep with pets. Day of Surgery : Do not apply any lotions/deodorants the morning of surgery.  Please wear clean clothes to the hospital/surgery center.  FAILURE TO FOLLOW THESE INSTRUCTIONS MAY RESULT IN THE CANCELLATION OF YOUR SURGERY PATIENT SIGNATURE_________________________________  NURSE SIGNATURE__________________________________  ________________________________________________________________________

## 2015-09-02 ENCOUNTER — Encounter (HOSPITAL_COMMUNITY): Payer: Self-pay

## 2015-09-02 ENCOUNTER — Encounter (HOSPITAL_COMMUNITY)
Admission: RE | Admit: 2015-09-02 | Discharge: 2015-09-02 | Disposition: A | Discharge status: Home / Self Care | Payer: BLUE CROSS/BLUE SHIELD | Source: Ambulatory Visit | Attending: Surgery | Admitting: Surgery

## 2015-09-02 HISTORY — DX: Unspecified osteoarthritis, unspecified site: M19.90

## 2015-09-02 HISTORY — DX: Personal history of other diseases of the digestive system: Z87.19

## 2015-09-02 HISTORY — DX: Sleep apnea, unspecified: G47.30

## 2015-09-02 LAB — CBC
HCT: 42.9 % (ref 36.0–46.0)
HEMOGLOBIN: 14.4 g/dL (ref 12.0–15.0)
MCH: 30.4 pg (ref 26.0–34.0)
MCHC: 33.6 g/dL (ref 30.0–36.0)
MCV: 90.5 fL (ref 78.0–100.0)
Platelets: 176 10*3/uL (ref 150–400)
RBC: 4.74 MIL/uL (ref 3.87–5.11)
RDW: 12.5 % (ref 11.5–15.5)
WBC: 4 10*3/uL (ref 4.0–10.5)

## 2015-09-02 LAB — BASIC METABOLIC PANEL
Anion gap: 5 (ref 5–15)
BUN: 18 mg/dL (ref 6–20)
CALCIUM: 9.3 mg/dL (ref 8.9–10.3)
CHLORIDE: 108 mmol/L (ref 101–111)
CO2: 27 mmol/L (ref 22–32)
CREATININE: 0.99 mg/dL (ref 0.44–1.00)
GFR calc non Af Amer: >60 mL/min (ref >60)
Glucose, Bld: 107 mg/dL — ABNORMAL HIGH (ref 65–99)
Potassium: 4.3 mmol/L (ref 3.5–5.1)
SODIUM: 140 mmol/L (ref 135–145)

## 2015-09-02 NOTE — Progress Notes (Signed)
Pt was reached via cell phone and informed of new arrival time for surgery on 09/05/15.  Pt was told to arrive at 1400.  Pt voiced understanding.

## 2015-09-02 NOTE — Progress Notes (Signed)
08/03/15- EKG - EPIC

## 2015-09-04 ENCOUNTER — Other Ambulatory Visit: Payer: Self-pay | Admitting: Surgery

## 2015-09-04 NOTE — H&P (Signed)
Angelica Gray  [facility] Patient #: [id number] DOB: 13-Sep-1961 Married / Language: English / Race: White Female   History of Present Illness    The patient is a 54 year old female who presents with a complaint of Consider bariatric surgery with known hiatal hernia.   Her PCP is Dr. Coral CeoW. Panosh  She has seend Dr. Sharia Reeve. Gessner for GI.  She comes with her husband.  US of abdomen - 08/03/2015 - showed fatty liver Her UGI was 05/20/2015 and [facility] Dr. Cyndia SkeetersLurey for psych - 08/11/2015  [Note: In reference to a denial letter from 24 Aug 2015. I spoke to Dr. Trinda PascalSandra Newton who is a reviewer for [employer]. She has a FP background. She had not looked at the chart. I explained that the patient had a hiatal hernia with a history of ulcers and anemia from the hiatal hernia. She arthritis of both knees. She said that she would review the record with the general surgeon that they have.]  History of hiatal hernia and weight loss:  She was referred by Dr. Stan Headarl Gessner to Dr. Abbey Chattersosenbower for further evaluation of a large paraesophageal hiatal hernia. She developed an ice craving it was noted to have mild iron deficiency anemia. Upper endoscopy on 02/01/2015 by Dr. Leone PayorGessner demonstrated the hiatal hernia with Cameron's erosions present. She had an a UGI on 05/20/2015 which showed a large paraesophageal hernia. She has some fullness and discomfort after eating a large meal. No vomiting. Mild reflux. Epigastric bloating is present. She was placed on a proton pump inhibitor and her reflux and bloating have improved.  She saw Dr. Abbey Chattersosenbower on 07/20/2015 for consideration of hiatal hernia repair. But with her obesity, Dr. Abbey Chattersosenbower was concerned about increased risk of recurrence and thought that she would be best served wtih weight loss surgery at the same time as hiatal hernia repair.  She has tried different diets: Weight watchers, slim  fast, and watching her calories. But she said that she gains more weigh than she loses with the diets - so she has tried this less recently. Sghe did try a diet pill in the past, but she felt like she was on an overdose of caffiene.   She has had labs - CBC, CMP, TSH, HgbA1c - within the last year that are okay.   I discussed the options with her, but I think that she is best served by RYGB with HH repair.  She understands that she has insurance issues. There is a peer to peer scheduled this afternoon with [employer]. I'll have to talk to the physician.   Per the 1991 NIH Consensus Statement, the patient is a candidate for bariatric surgery. The patient attended an information session and reviewed the types of bariatric surgery.   The patient is interested in the Roux en Y Gastric Bypass. I discussed with the patient the indications and risks of bariatric surgery. The potential risks of surgery include, but are not limited to, bleeding, infection, leak from the bowel, DVT and PE, open surgery, long term nutrition consequences, and death. The patient understands the importance of compliance and long term follow-up with our group after surgery.  Past Medical History: 1. OSA - not on CPAP at this time There has been discussion about her seeing a sleep specialist. 2. Arthritis - mainly of knees Her daughter has been diagnosed with RA The vit D she is taking has helped her symptoms. 3. Hypothyroid - corrected 4. History  of nephrolithiasis - 2004 She has one kidney which atrophic - but she is unsure which one (Left kidney is not see on [place]) 5. Colonoscopy by Dr. Leone Payor - 01/2015 - proctitis, diverticulosis 6. TL after last child 7. Fatty liver on [place] 8. She saw Dr. 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 Pantoprazole Sodium (  Tablet DR, Oral) Active. Iron (  Tablet, Oral) Active. Vitamin B-12 ER ( Tablet ER, Oral) Active. Medications Reconciled  Vitals (Ammie Eversole LPN; 29/56/2130 9:27 AM) 08/25/2015 9:26 AM Weight: 228.2 lb Height: 66.5in Body Surface Area: 2.13 m Body Mass Index: 36.28 kg/m  Temp.: 98.68F(Oral)  Pulse: 84 (Regular)  BP: 132/84 (Sitting, Left Arm, Standard)  Physical Exam  General: Obese WF alert and generally healthy appearing. HEENT: Normal. Pupils equal.  Neck: Supple. No mass. Prominent left thyroid lobe. Lymph Nodes: No supraclavicular or cervical nodes.  Lungs: Clear to auscultation and symmetric breath sounds. Heart: RRR. No murmur or rub.  Breast: Right - no mass Left - no mass  Abdomen: Soft. No mass. No tenderness. No hernia. Normal bowel sounds. Umbilical scar. She is more apple than pear.  Extremities: Good strength and ROM in upper and lower extremities.  Assessment & Plan: 1.  PARAESOPHAGEAL HIATAL HERNIA (K44.9)  Impression: She saw Dr. Abbey Chatters for this originally.   Because of her weight, there is a concern about failure of the a routine Nissen fundoplication in the setting of morbid obesity.  The patient went through our bariatric program - but has been denied by [facility], so we are back to fixing her hiatal hernia with a Nissen fundoplication.  I talked to her on the phone for about 15 minutes - going over the differences of the operations, the post op plan, and the discharge  plans.  [Addendum Note(Johnnell Liou H. Ezzard Standing MD; 08/31/2015 10:30 PM) I got a note from Sherion that she was denied again by [facility]. I have not seen the denial letter. The patient wants to go ahead with hiatal hernia repair.]  2.  OBESITY (BMI 30-39.9) (E66.9)     3.OSA - not on CPAP at this time There has been discussion about her seeing a sleep specialist. 4. Arthritis - mainly of knees Her daughter has been diagnosed with RA The vit D she is taking has helped her symptoms. 5. Hypothyroid - corrected 6. History of nephrolithiasis - 2004  She has one kidney which atrophic - but she is unsure which one (Left kidney is not see on [place])  7. Fatty liver on [place] 8. She saw Dr. Cyndia Skeeters for psych   Ovidio Kin, MD, [facility] Pager: [phone] Office phone:  [phone]

## 2015-09-05 ENCOUNTER — Inpatient Hospital Stay (HOSPITAL_COMMUNITY): Payer: BLUE CROSS/BLUE SHIELD | Admitting: Anesthesiology

## 2015-09-05 ENCOUNTER — Encounter (HOSPITAL_COMMUNITY)
Admission: RE | Disposition: A | Discharge status: Home / Self Care | Payer: Self-pay | Source: Ambulatory Visit | Attending: Surgery

## 2015-09-05 ENCOUNTER — Encounter (HOSPITAL_COMMUNITY): Payer: Self-pay

## 2015-09-05 ENCOUNTER — Inpatient Hospital Stay (HOSPITAL_COMMUNITY)
Admission: RE | Admit: 2015-09-05 | Discharge: 2015-09-07 | DRG: 328 | Disposition: A | Discharge status: Home / Self Care | Payer: BLUE CROSS/BLUE SHIELD | Source: Ambulatory Visit | Attending: Surgery | Admitting: Surgery

## 2015-09-05 DIAGNOSIS — M170 Bilateral primary osteoarthritis of knee: Secondary | ICD-10-CM | POA: Diagnosis present

## 2015-09-05 DIAGNOSIS — Z9889 Other specified postprocedural states: Secondary | ICD-10-CM

## 2015-09-05 DIAGNOSIS — K760 Fatty (change of) liver, not elsewhere classified: Secondary | ICD-10-CM | POA: Diagnosis present

## 2015-09-05 DIAGNOSIS — K449 Diaphragmatic hernia without obstruction or gangrene: Principal | ICD-10-CM | POA: Diagnosis present

## 2015-09-05 DIAGNOSIS — Z87442 Personal history of urinary calculi: Secondary | ICD-10-CM

## 2015-09-05 DIAGNOSIS — Z6836 Body mass index (BMI) 36.0-36.9, adult: Secondary | ICD-10-CM | POA: Diagnosis not present

## 2015-09-05 DIAGNOSIS — Z79899 Other long term (current) drug therapy: Secondary | ICD-10-CM

## 2015-09-05 DIAGNOSIS — E039 Hypothyroidism, unspecified: Secondary | ICD-10-CM | POA: Diagnosis present

## 2015-09-05 DIAGNOSIS — K219 Gastro-esophageal reflux disease without esophagitis: Secondary | ICD-10-CM | POA: Diagnosis present

## 2015-09-05 DIAGNOSIS — Z8719 Personal history of other diseases of the digestive system: Secondary | ICD-10-CM

## 2015-09-05 DIAGNOSIS — G4733 Obstructive sleep apnea (adult) (pediatric): Secondary | ICD-10-CM | POA: Diagnosis present

## 2015-09-05 HISTORY — PX: HIATAL HERNIA REPAIR: SHX195

## 2015-09-05 SURGERY — REPAIR, HERNIA, HIATAL, LAPAROSCOPIC
Anesthesia: General

## 2015-09-05 MED ORDER — PANTOPRAZOLE SODIUM 40 MG IV SOLR
40.0000 mg | Freq: Every day | INTRAVENOUS | Status: DC
  Administered 2015-09-05 – 2015-09-06 (×2): 40 mg via INTRAVENOUS
  Filled 2015-09-05 (×3): qty 40

## 2015-09-05 MED ORDER — BUPIVACAINE-EPINEPHRINE 0.5% -1:200000 IJ SOLN
INTRAMUSCULAR | Status: AC
  Filled 2015-09-05: qty 1

## 2015-09-05 MED ORDER — FENTANYL CITRATE (PF) 250 MCG/5ML IJ SOLN
INTRAMUSCULAR | Status: AC
  Filled 2015-09-05: qty 5

## 2015-09-05 MED ORDER — CEFOTETAN DISODIUM-DEXTROSE 2-2.08 GM-% IV SOLR
INTRAVENOUS | Status: AC
  Filled 2015-09-05: qty 50

## 2015-09-05 MED ORDER — HEPARIN SODIUM (PORCINE) 5000 UNIT/ML IJ SOLN
5000.0000 [IU] | Freq: Three times a day (TID) | INTRAMUSCULAR | Status: DC
  Administered 2015-09-06 – 2015-09-07 (×4): 5000 [IU] via SUBCUTANEOUS
  Filled 2015-09-05 (×7): qty 1

## 2015-09-05 MED ORDER — MIDAZOLAM HCL 5 MG/5ML IJ SOLN
INTRAMUSCULAR | Status: DC | PRN
  Administered 2015-09-05: 2 mg via INTRAVENOUS

## 2015-09-05 MED ORDER — PROMETHAZINE HCL 25 MG/ML IJ SOLN: 6.2500 mg | INTRAMUSCULAR | Status: DC | PRN

## 2015-09-05 MED ORDER — KCL IN DEXTROSE-NACL 20-5-0.45 MEQ/L-%-% IV SOLN
INTRAVENOUS | Status: DC
  Administered 2015-09-05 – 2015-09-07 (×5): via INTRAVENOUS
  Filled 2015-09-05 (×6): qty 1000

## 2015-09-05 MED ORDER — MORPHINE SULFATE (PF) 2 MG/ML IV SOLN
1.0000 mg | INTRAVENOUS | Status: DC | PRN
  Administered 2015-09-05 – 2015-09-06 (×5): 2 mg via INTRAVENOUS
  Filled 2015-09-05 (×5): qty 1

## 2015-09-05 MED ORDER — GLYCOPYRROLATE 0.2 MG/ML IJ SOLN
INTRAMUSCULAR | Status: DC | PRN
  Administered 2015-09-05: 0.6 mg via INTRAVENOUS

## 2015-09-05 MED ORDER — 0.9 % SODIUM CHLORIDE (POUR BTL) OPTIME
TOPICAL | Status: DC | PRN
  Administered 2015-09-05: 1000 mL

## 2015-09-05 MED ORDER — BUPIVACAINE-EPINEPHRINE 0.5% -1:200000 IJ SOLN
INTRAMUSCULAR | Status: DC | PRN
  Administered 2015-09-05: 35 mL

## 2015-09-05 MED ORDER — NEOSTIGMINE METHYLSULFATE 10 MG/10ML IV SOLN
INTRAVENOUS | Status: AC
  Filled 2015-09-05: qty 1

## 2015-09-05 MED ORDER — CEFOTETAN DISODIUM-DEXTROSE 2-2.08 GM-% IV SOLR
2.0000 g | INTRAVENOUS | Status: AC
  Administered 2015-09-05: 2 g via INTRAVENOUS

## 2015-09-05 MED ORDER — FENTANYL CITRATE (PF) 250 MCG/5ML IJ SOLN
INTRAMUSCULAR | Status: AC
Start: 2015-09-05 — End: 2015-09-05
  Filled 2015-09-05: qty 5

## 2015-09-05 MED ORDER — PROPOFOL 10 MG/ML IV BOLUS
INTRAVENOUS | Status: DC | PRN
  Administered 2015-09-05: 200 mg via INTRAVENOUS

## 2015-09-05 MED ORDER — LACTATED RINGERS IR SOLN
Status: DC | PRN
  Administered 2015-09-05: 1000 mL

## 2015-09-05 MED ORDER — ONDANSETRON HCL 4 MG/2ML IJ SOLN
INTRAMUSCULAR | Status: AC
  Filled 2015-09-05: qty 2

## 2015-09-05 MED ORDER — PROMETHAZINE HCL 25 MG/ML IJ SOLN: 12.5000 mg | Freq: Four times a day (QID) | INTRAMUSCULAR | Status: DC | PRN

## 2015-09-05 MED ORDER — ROCURONIUM BROMIDE 100 MG/10ML IV SOLN
INTRAVENOUS | Status: AC
  Filled 2015-09-05: qty 1

## 2015-09-05 MED ORDER — MIDAZOLAM HCL 2 MG/2ML IJ SOLN
INTRAMUSCULAR | Status: AC
  Filled 2015-09-05: qty 2

## 2015-09-05 MED ORDER — CHLORHEXIDINE GLUCONATE 4 % EX LIQD: 1.0000 "application " | Freq: Once | CUTANEOUS | Status: DC

## 2015-09-05 MED ORDER — DEXAMETHASONE SODIUM PHOSPHATE 10 MG/ML IJ SOLN
INTRAMUSCULAR | Status: AC
  Filled 2015-09-05: qty 1

## 2015-09-05 MED ORDER — ONDANSETRON HCL 4 MG/2ML IJ SOLN
4.0000 mg | Freq: Four times a day (QID) | INTRAMUSCULAR | Status: DC | PRN
  Administered 2015-09-05: 4 mg via INTRAVENOUS
  Filled 2015-09-05: qty 2

## 2015-09-05 MED ORDER — ONDANSETRON HCL 4 MG/2ML IJ SOLN
INTRAMUSCULAR | Status: DC | PRN
  Administered 2015-09-05: 4 mg via INTRAVENOUS

## 2015-09-05 MED ORDER — LACTATED RINGERS IV SOLN
INTRAVENOUS | Status: DC
  Administered 2015-09-05: 16:00:00 via INTRAVENOUS
  Administered 2015-09-05: 1000 mL via INTRAVENOUS
  Administered 2015-09-05: 17:00:00 via INTRAVENOUS

## 2015-09-05 MED ORDER — ROCURONIUM BROMIDE 100 MG/10ML IV SOLN
INTRAVENOUS | Status: DC | PRN
  Administered 2015-09-05: 10 mg via INTRAVENOUS
  Administered 2015-09-05: 20 mg via INTRAVENOUS
  Administered 2015-09-05: 30 mg via INTRAVENOUS
  Administered 2015-09-05: 20 mg via INTRAVENOUS
  Administered 2015-09-05: 10 mg via INTRAVENOUS

## 2015-09-05 MED ORDER — GLYCOPYRROLATE 0.2 MG/ML IJ SOLN
INTRAMUSCULAR | Status: AC
  Filled 2015-09-05: qty 3

## 2015-09-05 MED ORDER — PROPOFOL 10 MG/ML IV BOLUS
INTRAVENOUS | Status: AC
  Filled 2015-09-05: qty 20

## 2015-09-05 MED ORDER — DEXAMETHASONE SODIUM PHOSPHATE 10 MG/ML IJ SOLN
INTRAMUSCULAR | Status: DC | PRN
  Administered 2015-09-05: 10 mg via INTRAVENOUS

## 2015-09-05 MED ORDER — DEXTROSE 5 % IV SOLN
INTRAVENOUS | Status: AC
  Filled 2015-09-05: qty 2

## 2015-09-05 MED ORDER — NEOSTIGMINE METHYLSULFATE 10 MG/10ML IV SOLN
INTRAVENOUS | Status: DC | PRN
  Administered 2015-09-05: 4 mg via INTRAVENOUS

## 2015-09-05 MED ORDER — SUCCINYLCHOLINE CHLORIDE 20 MG/ML IJ SOLN
INTRAMUSCULAR | Status: DC | PRN
  Administered 2015-09-05: 100 mg via INTRAVENOUS

## 2015-09-05 MED ORDER — HEPARIN SODIUM (PORCINE) 5000 UNIT/ML IJ SOLN
5000.0000 [IU] | Freq: Once | INTRAMUSCULAR | Status: AC
  Administered 2015-09-05: 5000 [IU] via SUBCUTANEOUS
  Filled 2015-09-05: qty 1

## 2015-09-05 MED ORDER — FENTANYL CITRATE (PF) 250 MCG/5ML IJ SOLN
INTRAMUSCULAR | Status: DC | PRN
  Administered 2015-09-05 (×4): 50 ug via INTRAVENOUS
  Administered 2015-09-05: 100 ug via INTRAVENOUS

## 2015-09-05 MED ORDER — ONDANSETRON 4 MG PO TBDP: 4.0000 mg | ORAL_TABLET | Freq: Four times a day (QID) | ORAL | Status: DC | PRN

## 2015-09-05 MED ORDER — HYDROMORPHONE HCL 1 MG/ML IJ SOLN: 0.2500 mg | INTRAMUSCULAR | Status: DC | PRN

## 2015-09-05 SURGICAL SUPPLY — 52 items
APL SKNCLS STERI-STRIP NONHPOA (GAUZE/BANDAGES/DRESSINGS)
BENZOIN TINCTURE PRP APPL 2/3 (GAUZE/BANDAGES/DRESSINGS) IMPLANT
CHLORAPREP W/TINT 26ML (MISCELLANEOUS) ×2 IMPLANT
COVER SURGICAL LIGHT HANDLE (MISCELLANEOUS) ×2 IMPLANT
DECANTER SPIKE VIAL GLASS SM (MISCELLANEOUS) IMPLANT
DEVICE SUT QUICK LOAD TK 5 (STAPLE) ×10 IMPLANT
DEVICE SUT TI-KNOT TK 5X26 (MISCELLANEOUS) ×2 IMPLANT
DEVICE SUTURE ENDOST 10MM (ENDOMECHANICALS) ×2 IMPLANT
DISSECTOR BLUNT TIP ENDO 5MM (MISCELLANEOUS) ×2 IMPLANT
DRAIN PENROSE 18X1/2 LTX STRL (DRAIN) ×2 IMPLANT
DRAPE LAPAROSCOPIC ABDOMINAL (DRAPES) ×2 IMPLANT
ELECT PENCIL ROCKER SW 15FT (MISCELLANEOUS) IMPLANT
ELECT REM PT RETURN 9FT ADLT (ELECTROSURGICAL) ×2
ELECTRODE REM PT RTRN 9FT ADLT (ELECTROSURGICAL) ×1 IMPLANT
GLOVE BIOGEL PI IND STRL 7.0 (GLOVE) ×1 IMPLANT
GLOVE BIOGEL PI INDICATOR 7.0 (GLOVE) ×1
GLOVE SURG SIGNA 7.5 PF LTX (GLOVE) ×2 IMPLANT
GOWN STRL REUS W/ TWL XL LVL3 (GOWN DISPOSABLE) ×3 IMPLANT
GOWN STRL REUS W/TWL LRG LVL3 (GOWN DISPOSABLE) ×2 IMPLANT
GOWN STRL REUS W/TWL XL LVL3 (GOWN DISPOSABLE) ×6
HOLDER FOLEY CATH W/STRAP (MISCELLANEOUS) ×2 IMPLANT
KIT BASIN OR (CUSTOM PROCEDURE TRAY) ×2 IMPLANT
LIQUID BAND (GAUZE/BANDAGES/DRESSINGS) ×2 IMPLANT
NS IRRIG 1000ML POUR BTL (IV SOLUTION) ×2 IMPLANT
RETRACTOR LAPSCP 12X46 CVD (ENDOMECHANICALS) IMPLANT
RTRCTR LAPSCP 12X46 CVD (ENDOMECHANICALS)
SET IRRIG TUBING LAPAROSCOPIC (IRRIGATION / IRRIGATOR) ×2 IMPLANT
SHEARS HARMONIC ACE PLUS 36CM (ENDOMECHANICALS) ×2 IMPLANT
SLEEVE ADV FIXATION 5X100MM (TROCAR) ×2 IMPLANT
SLEEVE XCEL OPT CAN 5 100 (ENDOMECHANICALS) ×4 IMPLANT
SOLUTION ANTI FOG 6CC (MISCELLANEOUS) ×2 IMPLANT
STAPLER VISISTAT 35W (STAPLE) IMPLANT
STRIP CLOSURE SKIN 1/2X4 (GAUZE/BANDAGES/DRESSINGS) IMPLANT
SUT ETHIBOND 0 36 GRN (SUTURE) ×8 IMPLANT
SUT MNCRL AB 4-0 PS2 18 (SUTURE) ×2 IMPLANT
SUT SURGIDAC NAB ES-9 0 48 120 (SUTURE) ×7 IMPLANT
TIP INNERVISION DETACH 40FR (MISCELLANEOUS) IMPLANT
TIP INNERVISION DETACH 50FR (MISCELLANEOUS) IMPLANT
TIP INNERVISION DETACH 56FR (MISCELLANEOUS) ×1 IMPLANT
TIPS INNERVISION DETACH 40FR (MISCELLANEOUS)
TOWEL OR 17X26 10 PK STRL BLUE (TOWEL DISPOSABLE) ×3 IMPLANT
TOWEL OR NON WOVEN STRL DISP B (DISPOSABLE) ×2 IMPLANT
TRAY FOLEY W/METER SILVER 14FR (SET/KITS/TRAYS/PACK) ×2 IMPLANT
TRAY FOLEY W/METER SILVER 16FR (SET/KITS/TRAYS/PACK) IMPLANT
TRAY LAPAROSCOPIC (CUSTOM PROCEDURE TRAY) ×2 IMPLANT
TROCAR ADV FIXATION 5X100MM (TROCAR) ×1 IMPLANT
TROCAR BLADELESS OPT 5 75 (ENDOMECHANICALS) ×2 IMPLANT
TROCAR XCEL 12X100 BLDLESS (ENDOMECHANICALS) IMPLANT
TROCAR XCEL NON-BLD 11X100MML (ENDOMECHANICALS) ×2 IMPLANT
TROCAR XCEL NON-BLD 5MMX100MML (ENDOMECHANICALS) ×2 IMPLANT
TROCAR XCEL UNIV SLVE 11M 100M (ENDOMECHANICALS) ×3 IMPLANT
TUBING INSUFFLATION 10FT LAP (TUBING) ×2 IMPLANT

## 2015-09-05 NOTE — Op Note (Signed)
09/05/2015  8:31 PM  PATIENT:  Angelica Gray, 54 y.o., female, MRN: [id number]  PREOP DIAGNOSIS:  Hiatal hernia with approximately 1/2 of stomach in the mediastinum  POSTOP DIAGNOSIS:   Hiatal hernia with approximately 1/2 of stomach in the mediastinum  PROCEDURE:   Procedure(s): LAPAROSCOPIC REPAIR OF HIATAL HERNIA, Nissan fundoplicaiton  SURGEON:   Ovidio Kin, M.D.  ASSISTANT:   B. Hoxworth, M.D.  ANESTHESIA:   general  Anesthesiologist: Sherrian Divers, MD CRNA: Delphia Grates, CRNA  General  EBL:  minimal  ml  LOCAL MEDICATIONS USED:   35 cc 1/4% marcaine  SPECIMEN:   none  COUNTS CORRECT:  YES  INDICATIONS FOR PROCEDURE:  Angelica Gray is a 54 y.o. (DOB: [birth year]) white  female whose primary care physician is Lorretta Harp, MD and comes for repair of hiatal hernia and Nissan fundoplicaiton.   Upper endoscopy on 02/01/2015 by Dr. Leone Payor demonstrated the hiatal hernia with Cameron's erosions present. She had an a UGI on 05/20/2015 which showed a large paraesophageal hernia.   Because of her morbid obesity, I thought she would be best approached by weight loss operation with repair of her hiatal hernia.  But [employer] denied coverage for this option.   So we are proceeding with a hiatal hernia repair and Nissen fundoplication.  The patient is aware that weight loss post op will be important to decrease the risk of the hernia recurring.   The indications and risks of the surgery were explained to the patient.  The risks include, but are not limited to, infection, bleeding, and nerve injury.  Operative Note:  The patient was taken to room #4 at [facility] OR where she underwent general endotracheal anesthetic.  Her abdomen was prepped with ChloraPrep and sterilely draped.  A timeout was held and surgical checklist run.  Her abdomen was accessed in the left upper quadrant with a 5 mm Optiview trocar. I placed 5 additional trochars: A 5 mm  trocar in the subxiphoid location for the liver retractor,a 5 mm right subcostal trocar, 11 mm right paramedian trocar for the endo-suture, a 5 mm left paramedian trocar where the scope was placed, and then a 5 mm left lateral trocar for my assistant.  Abdominal exploration revealed a normal right and left lobes of the liver. The bowel, that I could see, was unremarkable. She had a moderate amount of central obesity.  Approximately 60% of her stomach was through a fairly small hiatal hernia above her diaphragm. The stomach was reduced.  There was a fair amount of chronic scar tissue along the greater curvature seen after the stomach was reduced.  I started dissecting out the sac of the hernia on the right cruz, I dissected anteriorly, and then took down the sac along the left crus.  I was able to dissect out the entire hernia sac from the mediastinum.  I amputated the sac.   The hiatal hernia defect was about 5 cm diameter.    I mobilized the distal esophagus approximately 6 cm above the gastroesophageal junction. After removing the hernia sac, I went along the greater curvature of the stomach, and took down the short gastrics using the Harmonic scalpel.  I put a Penrose drain around the gastroesophageal junction and dissected posteriorly behind the esophagus and stomach at the esophagogastric junction to open up a posterior window where I could pull the cardia around for the wrap.  After mobilizing the esophagus, I closed the hiatal hernia posteriorly with  3 sutures of interrupted 0 Ethibond suture and these were secured with a tie knot clip.  A photo was taken of the defect prior to sac removal, after the posterior defect was closed, and after the wrap.  I then passed a #56 bougie into the stomach.  I did a proximal wrap of the stomachusing 3 interrupted Ethibond sutureswith a free needle.  The 3 sutures To the left stomach, the anterior portion of the esophagus, and the greater curvature of stomach  that I pulled aro and I secured these with tie knots. The 56 bougie was removed without difficulty.  The wrap did not appear to tight. There was no bleeding either from the wrap or the dissection. The trocars were removed in turn.  I closed the skin with 4-0 Monocryl suture, pain in the wound with liquid band. The sponge and needle count were correct at the end of the case.  The patient tolerated the procedure well and was transported to the recovery room in good condition.  I plan an upper GI tomorrow to evaluate the hernia repair.  Ovidio Kinavid Prudencio Velazco, MD, [facility] Pager: [phone] Office phone:  [phone]

## 2015-09-05 NOTE — Transfer of Care (Signed)
Immediate Anesthesia Transfer of Care Note  Patient: Angelica BaptistCathy B Hunsucker  Procedure(s) Performed: Procedure(s): LAPAROSCOPIC REPAIR OF HIATAL HERNIA (N/A)  Patient Location: PACU  Anesthesia Type:General  Level of Consciousness: awake and patient cooperative  Airway & Oxygen Therapy: Patient Spontanous Breathing and Patient connected to face mask oxygen  Post-op Assessment: Report given to RN and Post -op Vital signs reviewed and stable  Post vital signs: Reviewed and stable  Last Vitals:  Filed Vitals:   09/05/15 1201  BP: 127/77  Pulse: 77  Temp: 36.5 C  Resp: 18    Complications: No apparent anesthesia complications

## 2015-09-05 NOTE — Anesthesia Procedure Notes (Signed)
Procedure Name: Intubation Date/Time: 09/05/2015 3:32 PM Performed by: Delphia GratesHANDLER, Greer Wainright Pre-anesthesia Checklist: Emergency Drugs available, Suction available, Patient being monitored and Patient identified Patient Re-evaluated:Patient Re-evaluated prior to inductionOxygen Delivery Method: Circle system utilized Preoxygenation: Pre-oxygenation with 100% oxygen Intubation Type: IV induction Laryngoscope Size: Mac and 4 Grade View: Grade II Tube type: Oral Tube size: 7.5 mm Number of attempts: 1 Airway Equipment and Method: Stylet Placement Confirmation: ETT inserted through vocal cords under direct vision and positive ETCO2 Secured at: 22 cm Tube secured with: Tape Dental Injury: Teeth and Oropharynx as per pre-operative assessment

## 2015-09-05 NOTE — Anesthesia Preprocedure Evaluation (Signed)
Anesthesia Evaluation  Patient identified by MRN, date of birth, ID band Patient awake    Reviewed: Allergy & Precautions, NPO status , Patient's Chart, lab work & pertinent test results  Airway Mallampati: II  TM Distance: >3 FB Neck ROM: Full    Dental no notable dental hx.    Pulmonary sleep apnea ,    Pulmonary exam normal breath sounds clear to auscultation       Cardiovascular negative cardio ROS Normal cardiovascular exam Rhythm:Regular Rate:Normal     Neuro/Psych  Neuromuscular disease negative psych ROS   GI/Hepatic Neg liver ROS, hiatal hernia, PUD,   Endo/Other  Hypothyroidism   Renal/GU Renal disease  negative genitourinary   Musculoskeletal  (+) Arthritis ,   Abdominal (+) + obese,   Peds negative pediatric ROS (+)  Hematology  (+) anemia ,   Anesthesia Other Findings   Reproductive/Obstetrics negative OB ROS                             Anesthesia Physical Anesthesia Plan  ASA: II  Anesthesia Plan: General   Post-op Pain Management:    Induction: Intravenous  Airway Management Planned: Oral ETT  Additional Equipment:   Intra-op Plan:   Post-operative Plan: Extubation in OR  Informed Consent: I have reviewed the patients History and Physical, chart, labs and discussed the procedure including the risks, benefits and alternatives for the proposed anesthesia with the patient or authorized representative who has indicated his/her understanding and acceptance.   Dental advisory given  Plan Discussed with: CRNA  Anesthesia Plan Comments:         Anesthesia Quick Evaluation

## 2015-09-05 NOTE — Progress Notes (Signed)
Called to patient room per patient request for sleep apnea. She wishes to try CPAP, but states she has never had a sleep study performed and does not wear a CPAP at home. Education provided. She denies having been diagnosed with obstructive vs central apnea in the past, and states "I stop breathing sometimes".  Patient agrees to have her PCP set up a sleep study and understands the importance of obtaining accurate results for the benefit of her health.

## 2015-09-05 NOTE — Anesthesia Postprocedure Evaluation (Signed)
Anesthesia Post Note  Patient: Angelica Gray  Procedure(s) Performed: Procedure(s) (LRB): LAPAROSCOPIC REPAIR OF HIATAL HERNIA (N/A)  Patient location during evaluation: PACU Anesthesia Type: General Level of consciousness: awake and alert Pain management: pain level controlled Vital Signs Assessment: post-procedure vital signs reviewed and stable Respiratory status: spontaneous breathing, nonlabored ventilation, respiratory function stable and patient connected to nasal cannula oxygen Cardiovascular status: blood pressure returned to baseline and stable Postop Assessment: No signs of nausea or vomiting Anesthetic complications: no    Last Vitals:  Filed Vitals:   09/05/15 1915 09/05/15 1930  BP: 109/73   Pulse: 69 71  Temp:    Resp: 14 11    Last Pain:  Filed Vitals:   09/05/15 1939  PainSc: Asleep                 Baneza Bartoszek J

## 2015-09-05 NOTE — Interval H&P Note (Signed)
History and Physical Interval Note:  09/05/2015 2:19 PM  Angelica Gray  has presented today for surgery, with the diagnosis of Hiatal hernia  The various methods of treatment have been discussed with the patient and family.  Her husband and daughter are here with her.  After consideration of risks, benefits and other options for treatment, the patient has consented to  Procedure(s): LAPAROSCOPIC REPAIR OF HIATAL HERNIA (N/A) as a surgical intervention .  The patient's history has been reviewed, patient examined, no change in status, stable for surgery.  I have reviewed the patient's chart and labs.  Questions were answered to the patient's satisfaction.     Joelyn Lover H

## 2015-09-06 ENCOUNTER — Inpatient Hospital Stay (HOSPITAL_COMMUNITY): Payer: BLUE CROSS/BLUE SHIELD

## 2015-09-06 ENCOUNTER — Encounter (HOSPITAL_COMMUNITY): Payer: Self-pay | Admitting: Surgery

## 2015-09-06 LAB — CBC
HCT: 39.8 % (ref 36.0–46.0)
Hemoglobin: 13.3 g/dL (ref 12.0–15.0)
MCH: 29.8 pg (ref 26.0–34.0)
MCHC: 33.4 g/dL (ref 30.0–36.0)
MCV: 89.2 fL (ref 78.0–100.0)
PLATELETS: 162 10*3/uL (ref 150–400)
RBC: 4.46 MIL/uL (ref 3.87–5.11)
RDW: 12.5 % (ref 11.5–15.5)
WBC: 9.9 10*3/uL (ref 4.0–10.5)

## 2015-09-06 MED ORDER — IOHEXOL 300 MG/ML  SOLN
150.0000 mL | Freq: Once | INTRAMUSCULAR | Status: DC | PRN
  Administered 2015-09-06: 150 mL via ORAL
  Filled 2015-09-06: qty 150

## 2015-09-06 MED ORDER — OXYCODONE HCL 5 MG/5ML PO SOLN
5.0000 mg | ORAL | Status: DC | PRN
  Administered 2015-09-07: 5 mg via ORAL
  Filled 2015-09-06: qty 5

## 2015-09-06 MED ORDER — ALUM & MAG HYDROXIDE-SIMETH 200-200-20 MG/5ML PO SUSP
30.0000 mL | Freq: Four times a day (QID) | ORAL | Status: DC | PRN
  Administered 2015-09-06: 30 mL via ORAL
  Filled 2015-09-06: qty 30

## 2015-09-06 MED ORDER — SALINE SPRAY 0.65 % NA SOLN
1.0000 | NASAL | Status: DC | PRN
  Administered 2015-09-07: 1 via NASAL
  Filled 2015-09-06: qty 44

## 2015-09-06 NOTE — Progress Notes (Signed)
General Surgery Note  LOS: 1 day  POD -  1 Day Post-Op  Assessment/Plan: 1.  LAPAROSCOPIC REPAIR OF HIATAL HERNIA, Nissen fundoplication - 09/05/2015 - D. Cipriana Biller  For swallow today  2. OBESITY (BMI 30-39.9) (E66.9) 3.OSA - not on CPAP at this time   There has been discussion about her seeing a sleep specialist. 4. Arthritis - mainly of knees Her daughter has been diagnosed with RA The vit D she is taking has helped her symptoms. 5. Hypothyroid - corrected 6. History of nephrolithiasis - 2004 She has one kidney which atrophic - but she is unsure which one (Left kidney is not see on US)  7. Fatty liver on US 8.  DVT prophylaxis - SQ Heparin   Active Problems:   History of repair of hiatal hernia   Subjective:  Doing well except sore.  No nausea.  In the room by herself. Objective:   Filed Vitals:   09/06/15 0155 09/06/15 0547  BP: 126/65 116/64  Pulse: 84 74  Temp: 98.2 F (36.8 C) 97.7 F (36.5 C)  Resp: 16 16     Intake/Output from previous day:  11/21 0701 - 11/22 0700 In: 3712.1 [P.O.:60; I.V.:3652.1] Out: 2000 [Urine:1900; Blood:100]  Intake/Output this shift:      Physical Exam:   General: WN AF who is alert and oriented.    HEENT: Normal. Pupils equal. .   Lungs: Clear.  IS - 1,500 cc   Abdomen: Soft   Wound: Clean   Lab Results:    Recent Labs  09/06/15 0551  WBC 9.9  HGB 13.3  HCT 39.8  PLT 162    BMET  No results for input(s): NA, K, CL, CO2, GLUCOSE, BUN, CREATININE, CALCIUM in the last 72 hours.  PT/INR  No results for input(s): LABPROT, INR in the last 72 hours.  ABG  No results for input(s): PHART, HCO3 in the last 72 hours.  Invalid input(s): PCO2, PO2   Studies/Results:  No results found.   Anti-infectives:   Anti-infectives    Start     Dose/Rate Route Frequency Ordered Stop   09/06/15 0600  cefoTEtan in Dextrose 5% (CEFOTAN) IVPB 2 g     2 g Intravenous On call to O.R. 09/05/15 1219  09/05/15 1538      Ovidio Kinavid Deshauna Cayson, MD, FACS Pager: [phone] [facility] Office: [phone] 09/06/2015

## 2015-09-06 NOTE — Progress Notes (Signed)
Patient alert and oriented, Post op day 1.  Provided support and encouragement.  Encouraged pulmonary toilet, ambulation and small sips of liquids.  Patient was a prospective bariatric surgery patient but unable to gain approval for bariatric surgery and requested through nursing staff that I visit her room today.  All questions answered.  Will continue to monitor.

## 2015-09-06 NOTE — Care Management Note (Signed)
Case Management Note  Patient Details  Name: Angelica Gray MRN: [id number] Date of Birth: [birth year]  Subjective/Objective:      S/p repair of hiatal hernia and [employer] fundoplicaiton             Action/Plan: Discharge planning, no HH needs identified  Expected Discharge Date:  09/07/15               Expected Discharge Plan:  Home/Self Care  In-House Referral:  NA  Discharge planning Services  CM Consult  Post Acute Care Choice:  NA Choice offered to:  NA  DME Arranged:  N/A DME Agency:  NA  HH Arranged:  NA HH Agency:  NA  Status of Service:  Completed, signed off  Medicare Important Message Given:    Date Medicare IM Given:    Medicare IM give by:    Date Additional Medicare IM Given:    Additional Medicare Important Message give by:     If discussed at Long Length of Stay Meetings, dates discussed:    Additional Comments:  Alexis Goodelleele, Devota Viruet K, RN 09/06/2015, 11:09 AM

## 2015-09-07 LAB — CBC
HCT: 36.3 % (ref 36.0–46.0)
HEMOGLOBIN: 12.1 g/dL (ref 12.0–15.0)
MCH: 30.6 pg (ref 26.0–34.0)
MCHC: 33.3 g/dL (ref 30.0–36.0)
MCV: 91.7 fL (ref 78.0–100.0)
Platelets: 131 10*3/uL — ABNORMAL LOW (ref 150–400)
RBC: 3.96 MIL/uL (ref 3.87–5.11)
RDW: 12.7 % (ref 11.5–15.5)
WBC: 6.9 10*3/uL (ref 4.0–10.5)

## 2015-09-07 MED ORDER — ONDANSETRON 4 MG PO TBDP: 4.0000 mg | ORAL_TABLET | Freq: Four times a day (QID) | ORAL | Status: DC | PRN

## 2015-09-07 MED ORDER — OXYCODONE HCL 5 MG/5ML PO SOLN: 5.0000 mg | Freq: Four times a day (QID) | ORAL | Status: DC | PRN

## 2015-09-07 NOTE — Discharge Instructions (Signed)
[facility] - DISCHARGE INSTRUCTIONS TO PATIENT  Return to work on:  In 2 weeks  Activity:  Driving - May drive in 2 or 3 days, if doing well   Lifting - No lifting more than 15 pounds for 1 week, then no limit  Wound Care:   May shower  Diet:  Full liquids for 2 weeks      If the full liquids seem too tight, go back to clear liquids.      See diet sheet  Follow up appointment:  Call Dr. Allene PyoNewman's office [facility]) at [phone] for an appointment in 2 to 3 weeks.  Medications and dosages:  Resume your home medications.  You have a prescription for:  Oxycodone and Zofran  Call Dr. Ezzard StandingNewman or his office  [phone]) if you have:  Temperature greater than 100.4,  Persistent nausea and vomiting,  Severe uncontrolled pain,  Redness, tenderness, or signs of infection (pain, swelling, redness, odor or green/yellow discharge around the site),  Difficulty breathing, headache or visual disturbances,  Any other questions or concerns you may have after discharge.  In an emergency, call 911 or go to an Emergency Department at a nearby hospital.

## 2015-09-07 NOTE — Discharge Summary (Signed)
Physician Discharge Summary  Patient ID:  Angelica Gray  MRN: [id number]  DOB/AGE: 1961/06/17 54 y.o.  Admit date: 09/05/2015 Discharge date: 09/07/2015  Discharge Diagnoses:  1.  Hiatal hernia with Cameron's erosions  2. OBESITY (BMI 30-39.9) (E66.9) 3. OSA - She has not been tested, but his has been observed by the nurses.  Will order sleep study as outpt 4. Arthritis - mainly of knees  Her daughter has been diagnosed with RA  The vit D she is taking has helped her symptoms. 5. Hypothyroid - corrected 6. History of nephrolithiasis - 2004 She has one kidney which atrophic - but she is unsure which one (Left kidney is not see on US)  7. Fatty liver on US  Operation: Procedure(s): LAPAROSCOPIC REPAIR OF HIATAL HERNIA, Nissen fundoplication on 09/05/2015 - D. Ezzard StandingNewman  Discharged Condition: good  Hospital Course: Angelica Gray is an 54 y.o. female whose primary care physician is Lorretta HarpPANOSH,WANDA KOTVAN, MD and who was admitted 09/05/2015 with a chief complaint of Hiatal hernia with Cameron's erosions.   She was brought to the operating room on 09/05/2015 and underwent LAPAROSCOPIC REPAIR OF HIATAL HERNIA, Nissen fundoplication. On the first post op day, her swallow looked good, though her esophagus emptied a little slowly. She is tolerated clear liquids.  Will try full liquids prior to discharge.   The discharge instructions were reviewed with the patient.  Consults: None  Significant Diagnostic Studies: Results for orders placed or performed during the hospital encounter of 09/05/15  CBC  Result Value Ref Range   WBC 9.9 4.0 - 10.5 K/uL   RBC 4.46 3.87 - 5.11 MIL/uL   Hemoglobin 13.3 12.0 - 15.0 g/dL   HCT 14.739.8 82.936.0 - 56.246.0 %   MCV 89.2 78.0 - 100.0 fL   MCH 29.8 26.0 - 34.0 pg   MCHC 33.4 30.0 - 36.0 g/dL   RDW 13.012.5 86.511.5 - 78.415.5 %   Platelets 162 150 - 400 K/uL  CBC  Result Value Ref Range   WBC 6.9 4.0 - 10.5 K/uL   RBC 3.96 3.87 - 5.11 MIL/uL    Hemoglobin 12.1 12.0 - 15.0 g/dL   HCT 69.636.3 29.536.0 - 28.446.0 %   MCV 91.7 78.0 - 100.0 fL   MCH 30.6 26.0 - 34.0 pg   MCHC 33.3 30.0 - 36.0 g/dL   RDW 13.212.7 44.011.5 - 10.215.5 %   Platelets 131 (L) 150 - 400 K/uL    Dg Ugi W/water Sol Cm  09/06/2015  CLINICAL DATA:  [age] year old female post hiatal hernia repair. Subsequent encounter. EXAM: WATER SOLUBLE UPPER GI SERIES TECHNIQUE: Single-column upper GI series was performed using water soluble contrast. CONTRAST:  150mL OMNIPAQUE IOHEXOL 300 MG/ML  SOLN COMPARISON:  None. FLUOROSCOPY TIME:  Radiation Exposure Index (as provided by the fluoroscopic device): 472.2 mGy. FINDINGS: Repair of large paraesophageal hiatal hernia. Narrowing at the repair site without significant holdup of forward flow of ingested contrast. No leak identified. Prominent tertiary wave esophageal contractions. No obvious gastric mass or ulceration. IMPRESSION: Repair of large paraesophageal hiatal hernia. Narrowing at the repair site without significant holdup of forward flow of ingested contrast. No leak identified. Prominent tertiary wave esophageal contractions. Electronically Signed   By: Lacy DuverneySteven  Olson M.D.   On: 09/06/2015 11:31    Discharge Exam:  Filed Vitals:   09/06/15 2142 09/07/15 0539  BP: 107/69 109/79  Pulse: 75 75  Temp: 98.1 F (36.7 C) 98.4 F (36.9 C)  Resp: 18 18  General: WN obese WF who is alert and generally healthy appearing.  Lungs: Clear to auscultation and symmetric breath sounds. Heart:  RRR. No murmur or rub. Abdomen: Soft. No mass. Incisions look good.  Discharge Medications:     Medication List    TAKE these medications        Iron 325 (65 FE) MG Tabs  Take 325 mg by mouth daily.     levothyroxine 112 MCG tablet  Commonly known as:  SYNTHROID, LEVOTHROID  Take 1 tablet (112 mcg total) by mouth daily.     ondansetron 4 MG disintegrating tablet  Commonly known as:  ZOFRAN-ODT  Take 1 tablet (4 mg total) by mouth every 6 (six) hours  as needed for nausea.     oxyCODONE 5 MG/5ML solution  Commonly known as:  ROXICODONE  Take 5 mLs (5 mg total) by mouth every 6 (six) hours as needed for moderate pain or severe pain.     pantoprazole 40 MG tablet  Commonly known as:  PROTONIX  Take 1 tablet (40 mg total) by mouth daily.     VITAMIN D PO  Take 1 tablet by mouth daily.        Disposition:       Discharge Instructions    Diet - low sodium heart healthy    Complete by:  As directed      Increase activity slowly    Complete by:  As directed             Return to work on:  In 2 weeks  Activity:  Driving - May drive in 2 or 3 days, if doing well   Lifting - No lifting more than 15 pounds for 1 week, then no limit  Wound Care:   May shower  Diet:  Full liquids for 2 weeks      If the full liquids seem too tight, go back to clear liquids.      See diet sheet  Follow up appointment:  Call Dr. Allene Pyo office [facility]) at [phone] for an appointment in 2 to 3 weeks.  Medications and dosages:  Resume your home medications.  You have a prescription for:  Oxycodone and Zofran   Signed: Ovidio Kin, M.D., [facility] Office:  [phone]  09/07/2015, 7:48 AM

## 2015-09-12 ENCOUNTER — Ambulatory Visit (HOSPITAL_BASED_OUTPATIENT_CLINIC_OR_DEPARTMENT_OTHER): Payer: BLUE CROSS/BLUE SHIELD | Attending: Surgery

## 2015-09-12 VITALS — Ht 66.5 in | Wt 220.0 lb

## 2015-09-12 DIAGNOSIS — R0683 Snoring: Secondary | ICD-10-CM | POA: Diagnosis not present

## 2015-09-12 DIAGNOSIS — G4733 Obstructive sleep apnea (adult) (pediatric): Secondary | ICD-10-CM

## 2015-09-12 DIAGNOSIS — G4736 Sleep related hypoventilation in conditions classified elsewhere: Secondary | ICD-10-CM | POA: Insufficient documentation

## 2015-09-17 DIAGNOSIS — G4733 Obstructive sleep apnea (adult) (pediatric): Secondary | ICD-10-CM | POA: Diagnosis not present

## 2015-09-17 DIAGNOSIS — R0683 Snoring: Secondary | ICD-10-CM | POA: Diagnosis not present

## 2015-09-17 NOTE — Progress Notes (Signed)
   Patient Name: Angelica Gray, Angelica Gray Study Date: 09/12/2015 Gender: Female D.O.B: 1961-08-01 Age (years): 53 Referring Provider: Ovidio Kinavid Newman Height (inches): 67 Interpreting Physician: Jetty Duhamellinton Young MD, ABSM Weight (lbs): 220 RPSGT: Angelica Gray, Angelica Gray BMI: 35 MRN: [id number] Neck Size: 15.50 CLINICAL INFORMATION Sleep Study Type: NPSG Indication for sleep study: N/A Epworth Sleepiness Score: 21  SLEEP STUDY TECHNIQUE As per the AASM Manual for the Scoring of Sleep and Associated Events v2.3 (April 2016) with a hypopnea requiring 4% desaturations. The channels recorded and monitored were frontal, central and occipital EEG, electrooculogram (EOG), submentalis EMG (chin), nasal and oral airflow, thoracic and abdominal wall motion, anterior tibialis EMG, snore microphone, electrocardiogram, and pulse oximetry. MEDICATIONS Patient's medications include: charted for review. Medications self-administered by patient during sleep study : No sleep medicine administered.  SLEEP ARCHITECTURE  The study was initiated at 10:41:00 PM and ended at 4:45:00 AM. Sleep onset time was 29.2 minutes and the sleep efficiency was 81.1%. The total sleep time was 295.3 minutes. Stage REM latency was 224.5 minutes. The patient spent 5.59% of the night in stage N1 sleep, 46.05% in stage N2 sleep, 36.40% in stage N3 and 11.97% in REM. Alpha intrusion was absent. Supine sleep was 43.34%.  RESPIRATORY PARAMETERS The overall apnea/hypopnea index (AHI) was 77.0 per hour. There were 225 total apneas, including 208 obstructive, 17 central and 0 mixed apneas. There were 154 hypopneas and 9 RERAs. The AHI during Stage REM sleep was 64.5 per hour. AHI while supine was 98.4 per hour. The mean oxygen saturation was 87.48%. The minimum SpO2 during sleep was 66.00%. Loud snoring was noted during this study.  CARDIAC DATA The 2 lead EKG demonstrated sinus rhythm. The mean heart rate was 69.36 beats per minute. Other EKG findings  include: None.  LEG MOVEMENT DATA The total PLMS were 3 with a resulting PLMS index of 0.61. Associated arousal with leg movement index was 0.0 . IMPRESSIONS - Severe obstructive sleep apnea occurred during this study (AHI = 77.0/h). - No significant central sleep apnea occurred during this study (CAI = 3.5/h). - Severe oxygen desaturation was noted during this study (Min O2 = 66.00%). - The patient snored with Loud snoring volume. - No cardiac abnormalities were noted during this study. - Clinically significant periodic limb movements did not occur during sleep. No significant associated arousals.  DIAGNOSIS - Obstructive Sleep Apnea (327.23 [G47.33 ICD-10]) - Nocturnal Hypoxemia (327.26 [G47.36 ICD-10])  RECOMMENDATIONS  - Therapeutic CPAP titration to determine optimal pressure required to alleviate sleep disordered breathing. - Positional therapy avoiding supine position during sleep. - Avoid alcohol, sedatives and other CNS depressants that may worsen sleep apnea and disrupt normal sleep architecture. - Sleep hygiene should be reviewed to assess factors that may improve sleep quality. - Weight management and regular exercise should be initiated or continued if appropriate.  Waymon BudgeYOUNG,CLINTON D Diplomate, American Board of Sleep Medicine  ELECTRONICALLY SIGNED ON:  09/17/2015, 11:14 AM [facility] PH: [phone]   FX: [phone] ACCREDITED BY THE AMERICAN ACADEMY OF SLEEP MEDICINE

## 2015-10-07 ENCOUNTER — Other Ambulatory Visit: Payer: Self-pay | Admitting: Internal Medicine

## 2015-10-11 ENCOUNTER — Telehealth: Payer: Self-pay | Admitting: Internal Medicine

## 2015-10-11 DIAGNOSIS — G4733 Obstructive sleep apnea (adult) (pediatric): Secondary | ICD-10-CM

## 2015-10-11 NOTE — Telephone Encounter (Signed)
Spoke to the pt.  She needs a referral to pulmonary for OSA.  See note in chart from Ruthy Dickicardo Nicholl.  Referral needs to come from PCP.  Please advise. Also had a hernia repair on Nov. 21st.  Has had her follow up with the surgeon and will follow up again in 3 months.

## 2015-10-11 NOTE — Telephone Encounter (Signed)
Patient is calling in regards to your sleep and surgery that the patient has had. Patient states that she need to get in before the end of the year.

## 2015-10-11 NOTE — Telephone Encounter (Signed)
Referral to pulmonary has been placed in the system.  WP aware of hernia repair.

## 2015-10-12 ENCOUNTER — Ambulatory Visit (HOSPITAL_BASED_OUTPATIENT_CLINIC_OR_DEPARTMENT_OTHER): Payer: BLUE CROSS/BLUE SHIELD

## 2015-10-14 ENCOUNTER — Telehealth: Payer: Self-pay | Admitting: Internal Medicine

## 2015-10-14 NOTE — Telephone Encounter (Signed)
yes

## 2015-10-14 NOTE — Telephone Encounter (Signed)
Pt mom would like to be work in on 10-31-15 for a cpx. Can I SCH?

## 2015-10-14 NOTE — Telephone Encounter (Signed)
Pt has been sch

## 2015-10-31 ENCOUNTER — Encounter: Payer: Self-pay | Admitting: Internal Medicine

## 2015-10-31 ENCOUNTER — Ambulatory Visit (INDEPENDENT_AMBULATORY_CARE_PROVIDER_SITE_OTHER): Payer: BLUE CROSS/BLUE SHIELD | Admitting: Internal Medicine

## 2015-10-31 ENCOUNTER — Other Ambulatory Visit (HOSPITAL_COMMUNITY)
Admission: RE | Admit: 2015-10-31 | Discharge: 2015-10-31 | Disposition: A | Discharge status: Home / Self Care | Payer: BLUE CROSS/BLUE SHIELD | Source: Ambulatory Visit | Attending: Internal Medicine | Admitting: Internal Medicine

## 2015-10-31 VITALS — BP 116/78 | Temp 97.5°F | Ht 65.0 in | Wt 207.6 lb

## 2015-10-31 DIAGNOSIS — R739 Hyperglycemia, unspecified: Secondary | ICD-10-CM

## 2015-10-31 DIAGNOSIS — N261 Atrophy of kidney (terminal): Secondary | ICD-10-CM

## 2015-10-31 DIAGNOSIS — E611 Iron deficiency: Secondary | ICD-10-CM | POA: Diagnosis not present

## 2015-10-31 DIAGNOSIS — G2581 Restless legs syndrome: Secondary | ICD-10-CM

## 2015-10-31 DIAGNOSIS — Z01419 Encounter for gynecological examination (general) (routine) without abnormal findings: Secondary | ICD-10-CM

## 2015-10-31 DIAGNOSIS — E049 Nontoxic goiter, unspecified: Secondary | ICD-10-CM

## 2015-10-31 DIAGNOSIS — Z1151 Encounter for screening for human papillomavirus (HPV): Secondary | ICD-10-CM | POA: Insufficient documentation

## 2015-10-31 DIAGNOSIS — G4733 Obstructive sleep apnea (adult) (pediatric): Secondary | ICD-10-CM

## 2015-10-31 DIAGNOSIS — Z01411 Encounter for gynecological examination (general) (routine) with abnormal findings: Secondary | ICD-10-CM | POA: Insufficient documentation

## 2015-10-31 DIAGNOSIS — Z0000 Encounter for general adult medical examination without abnormal findings: Secondary | ICD-10-CM | POA: Diagnosis not present

## 2015-10-31 DIAGNOSIS — E559 Vitamin D deficiency, unspecified: Secondary | ICD-10-CM

## 2015-10-31 DIAGNOSIS — E039 Hypothyroidism, unspecified: Secondary | ICD-10-CM

## 2015-10-31 LAB — LIPID PANEL
CHOLESTEROL: 172 mg/dL (ref 0–200)
HDL: 43.2 mg/dL (ref >39.00)
LDL Cholesterol: 118 mg/dL — ABNORMAL HIGH (ref 0–99)
NONHDL: 128.36
TRIGLYCERIDES: 52 mg/dL (ref 0.0–149.0)
Total CHOL/HDL Ratio: 4
VLDL: 10.4 mg/dL (ref 0.0–40.0)

## 2015-10-31 LAB — BASIC METABOLIC PANEL
BUN: 17 mg/dL (ref 6–23)
CALCIUM: 9.6 mg/dL (ref 8.4–10.5)
CO2: 25 meq/L (ref 19–32)
CREATININE: 1 mg/dL (ref 0.40–1.20)
Chloride: 106 mEq/L (ref 96–112)
GFR: 61.39 mL/min (ref >60.00)
GLUCOSE: 100 mg/dL — AB (ref 70–99)
Potassium: 4.6 mEq/L (ref 3.5–5.1)
SODIUM: 142 meq/L (ref 135–145)

## 2015-10-31 LAB — HEPATIC FUNCTION PANEL
ALBUMIN: 4.2 g/dL (ref 3.5–5.2)
ALK PHOS: 115 U/L (ref 39–117)
ALT: 24 U/L (ref 0–35)
AST: 27 U/L (ref 0–37)
Bilirubin, Direct: 0.2 mg/dL (ref 0.0–0.3)
Total Bilirubin: 0.9 mg/dL (ref 0.2–1.2)
Total Protein: 7 g/dL (ref 6.0–8.3)

## 2015-10-31 LAB — FERRITIN: FERRITIN: 137 ng/mL (ref 10.0–291.0)

## 2015-10-31 LAB — CBC WITH DIFFERENTIAL/PLATELET
BASOS ABS: 0 10*3/uL (ref 0.0–0.1)
BASOS PCT: 0.6 % (ref 0.0–3.0)
Eosinophils Absolute: 0.1 10*3/uL (ref 0.0–0.7)
Eosinophils Relative: 2.7 % (ref 0.0–5.0)
HCT: 40.7 % (ref 36.0–46.0)
Hemoglobin: 13.6 g/dL (ref 12.0–15.0)
LYMPHS ABS: 1.1 10*3/uL (ref 0.7–4.0)
Lymphocytes Relative: 28.4 % (ref 12.0–46.0)
MCHC: 33.5 g/dL (ref 30.0–36.0)
MCV: 89.5 fl (ref 78.0–100.0)
MONOS PCT: 6.4 % (ref 3.0–12.0)
Monocytes Absolute: 0.3 10*3/uL (ref 0.1–1.0)
NEUTROS ABS: 2.5 10*3/uL (ref 1.4–7.7)
NEUTROS PCT: 61.9 % (ref 43.0–77.0)
PLATELETS: 155 10*3/uL (ref 150.0–400.0)
RBC: 4.54 Mil/uL (ref 3.87–5.11)
RDW: 12.8 % (ref 11.5–15.5)
WBC: 4.1 10*3/uL (ref 4.0–10.5)

## 2015-10-31 LAB — IBC PANEL
Iron: 117 ug/dL (ref 42–145)
SATURATION RATIOS: 35 % (ref 20.0–50.0)
TRANSFERRIN: 239 mg/dL (ref 212.0–360.0)

## 2015-10-31 LAB — TSH: TSH: 0.88 u[IU]/mL (ref 0.35–4.50)

## 2015-10-31 LAB — VITAMIN D 25 HYDROXY (VIT D DEFICIENCY, FRACTURES): VITD: 31.68 ng/mL (ref 30.00–100.00)

## 2015-10-31 NOTE — Patient Instructions (Signed)
Continue lifestyle intervention healthy eating and exercise . Ask surgeon gi about how  Long you need to stay on the protonix.  Continue healthy weight loss . Will be contacted about lab and PAP results  And also  appt for ultrasound of you neck to check your thyroid for nodules .   Wt Readings from Last 3 Encounters:  10/31/15 207 lb 9.6 oz (94.167 kg)  09/12/15 220 lb (99.791 kg)  09/05/15 222 lb (100.699 kg)   Health Maintenance, Female Adopting a healthy lifestyle and getting preventive care can go a long way to promote health and wellness. Talk with your health care provider about what schedule of regular examinations is right for you. This is a good chance for you to check in with your provider about disease prevention and staying healthy. In between checkups, there are plenty of things you can do on your own. Experts have done a lot of research about which lifestyle changes and preventive measures are most likely to keep you healthy. Ask your health care provider for more information. WEIGHT AND DIET  Eat a healthy diet  Be sure to include plenty of vegetables, fruits, low-fat dairy products, and lean protein.  Do not eat a lot of foods high in solid fats, added sugars, or salt.  Get regular exercise. This is one of the most important things you can do for your health.  Most adults should exercise for at least 150 minutes each week. The exercise should increase your heart rate and make you sweat (moderate-intensity exercise).  Most adults should also do strengthening exercises at least twice a week. This is in addition to the moderate-intensity exercise.  Maintain a healthy weight  Body mass index (BMI) is a measurement that can be used to identify possible weight problems. It estimates body fat based on height and weight. Your health care provider can help determine your BMI and help you achieve or maintain a healthy weight.  For females 46 years of age and older:   A BMI  below 18.5 is considered underweight.  A BMI of 18.5 to 24.9 is normal.  A BMI of 25 to 29.9 is considered overweight.  A BMI of 30 and above is considered obese.  Watch levels of cholesterol and blood lipids  You should start having your blood tested for lipids and cholesterol at 55 years of age, then have this test every 5 years.  You may need to have your cholesterol levels checked more often if:  Your lipid or cholesterol levels are high.  You are older than 55 years of age.  You are at high risk for heart disease.  CANCER SCREENING   Lung Cancer  Lung cancer screening is recommended for adults 88-12 years old who are at high risk for lung cancer because of a history of smoking.  A yearly low-dose CT scan of the lungs is recommended for people who:  Currently smoke.  Have quit within the past 15 years.  Have at least a 30-pack-year history of smoking. A pack year is smoking an average of one pack of cigarettes a day for 1 year.  Yearly screening should continue until it has been 15 years since you quit.  Yearly screening should stop if you develop a health problem that would prevent you from having lung cancer treatment.  Breast Cancer  Practice breast self-awareness. This means understanding how your breasts normally appear and feel.  It also means doing regular breast self-exams. Let your health care provider  know about any changes, no matter how small.  If you are in your 20s or 30s, you should have a clinical breast exam (CBE) by a health care provider every 1-3 years as part of a regular health exam.  If you are 74 or older, have a CBE every year. Also consider having a breast X-ray (mammogram) every year.  If you have a family history of breast cancer, talk to your health care provider about genetic screening.  If you are at high risk for breast cancer, talk to your health care provider about having an MRI and a mammogram every year.  Breast cancer gene  (BRCA) assessment is recommended for women who have family members with BRCA-related cancers. BRCA-related cancers include:  Breast.  Ovarian.  Tubal.  Peritoneal cancers.  Results of the assessment will determine the need for genetic counseling and BRCA1 and BRCA2 testing. Cervical Cancer Your health care provider may recommend that you be screened regularly for cancer of the pelvic organs (ovaries, uterus, and vagina). This screening involves a pelvic examination, including checking for microscopic changes to the surface of your cervix (Pap test). You may be encouraged to have this screening done every 3 years, beginning at age 50.  For women ages 43-65, health care providers may recommend pelvic exams and Pap testing every 3 years, or they may recommend the Pap and pelvic exam, combined with testing for human papilloma virus (HPV), every 5 years. Some types of HPV increase your risk of cervical cancer. Testing for HPV may also be done on women of any age with unclear Pap test results.  Other health care providers may not recommend any screening for nonpregnant women who are considered low risk for pelvic cancer and who do not have symptoms. Ask your health care provider if a screening pelvic exam is right for you.  If you have had past treatment for cervical cancer or a condition that could lead to cancer, you need Pap tests and screening for cancer for at least 20 years after your treatment. If Pap tests have been discontinued, your risk factors (such as having a new sexual partner) need to be reassessed to determine if screening should resume. Some women have medical problems that increase the chance of getting cervical cancer. In these cases, your health care provider may recommend more frequent screening and Pap tests. Colorectal Cancer  This type of cancer can be detected and often prevented.  Routine colorectal cancer screening usually begins at 55 years of age and continues through  55 years of age.  Your health care provider may recommend screening at an earlier age if you have risk factors for colon cancer.  Your health care provider may also recommend using home test kits to check for hidden blood in the stool.  A small camera at the end of a tube can be used to examine your colon directly (sigmoidoscopy or colonoscopy). This is done to check for the earliest forms of colorectal cancer.  Routine screening usually begins at age 23.  Direct examination of the colon should be repeated every 5-10 years through 55 years of age. However, you may need to be screened more often if early forms of precancerous polyps or small growths are found. Skin Cancer  Check your skin from head to toe regularly.  Tell your health care provider about any new moles or changes in moles, especially if there is a change in a mole's shape or color.  Also tell your health care provider if  you have a mole that is larger than the size of a pencil eraser.  Always use sunscreen. Apply sunscreen liberally and repeatedly throughout the day.  Protect yourself by wearing long sleeves, pants, a wide-brimmed hat, and sunglasses whenever you are outside. HEART DISEASE, DIABETES, AND HIGH BLOOD PRESSURE   High blood pressure causes heart disease and increases the risk of stroke. High blood pressure is more likely to develop in:  People who have blood pressure in the high end of the normal range (130-139/85-89 mm Hg).  People who are overweight or obese.  People who are African American.  If you are 57-21 years of age, have your blood pressure checked every 3-5 years. If you are 60 years of age or older, have your blood pressure checked every year. You should have your blood pressure measured twice--once when you are at a hospital or clinic, and once when you are not at a hospital or clinic. Record the average of the two measurements. To check your blood pressure when you are not at a hospital or  clinic, you can use:  An automated blood pressure machine at a pharmacy.  A home blood pressure monitor.  If you are between 45 years and 51 years old, ask your health care provider if you should take aspirin to prevent strokes.  Have regular diabetes screenings. This involves taking a blood sample to check your fasting blood sugar level.  If you are at a normal weight and have a low risk for diabetes, have this test once every three years after 55 years of age.  If you are overweight and have a high risk for diabetes, consider being tested at a younger age or more often. PREVENTING INFECTION  Hepatitis B  If you have a higher risk for hepatitis B, you should be screened for this virus. You are considered at high risk for hepatitis B if:  You were born in a country where hepatitis B is common. Ask your health care provider which countries are considered high risk.  Your parents were born in a high-risk country, and you have not been immunized against hepatitis B (hepatitis B vaccine).  You have HIV or AIDS.  You use needles to inject street drugs.  You live with someone who has hepatitis B.  You have had sex with someone who has hepatitis B.  You get hemodialysis treatment.  You take certain medicines for conditions, including cancer, organ transplantation, and autoimmune conditions. Hepatitis C  Blood testing is recommended for:  Everyone born from [birth year] through 1965.  Anyone with known risk factors for hepatitis C. Sexually transmitted infections (STIs)  You should be screened for sexually transmitted infections (STIs) including gonorrhea and chlamydia if:  You are sexually active and are younger than 55 years of age.  You are older than 55 years of age and your health care provider tells you that you are at risk for this type of infection.  Your sexual activity has changed since you were last screened and you are at an increased risk for chlamydia or gonorrhea. Ask  your health care provider if you are at risk.  If you do not have HIV, but are at risk, it may be recommended that you take a prescription medicine daily to prevent HIV infection. This is called pre-exposure prophylaxis (PrEP). You are considered at risk if:  You are sexually active and do not regularly use condoms or know the HIV status of your partner(s).  You take drugs by injection.  You are sexually active with a partner who has HIV. Talk with your health care provider about whether you are at high risk of being infected with HIV. If you choose to begin PrEP, you should first be tested for HIV. You should then be tested every 3 months for as long as you are taking PrEP.  PREGNANCY   If you are premenopausal and you may become pregnant, ask your health care provider about preconception counseling.  If you may become pregnant, take 400 to 800 micrograms (mcg) of folic acid every day.  If you want to prevent pregnancy, talk to your health care provider about birth control (contraception). OSTEOPOROSIS AND MENOPAUSE   Osteoporosis is a disease in which the bones lose minerals and strength with aging. This can result in serious bone fractures. Your risk for osteoporosis can be identified using a bone density scan.  If you are 91 years of age or older, or if you are at risk for osteoporosis and fractures, ask your health care provider if you should be screened.  Ask your health care provider whether you should take a calcium or vitamin D supplement to lower your risk for osteoporosis.  Menopause may have certain physical symptoms and risks.  Hormone replacement therapy may reduce some of these symptoms and risks. Talk to your health care provider about whether hormone replacement therapy is right for you.  HOME CARE INSTRUCTIONS   Schedule regular health, dental, and eye exams.  Stay current with your immunizations.   Do not use any tobacco products including cigarettes, chewing  tobacco, or electronic cigarettes.  If you are pregnant, do not drink alcohol.  If you are breastfeeding, limit how much and how often you drink alcohol.  Limit alcohol intake to no more than 1 drink per day for nonpregnant women. One drink equals 12 ounces of beer, 5 ounces of wine, or 1 ounces of hard liquor.  Do not use street drugs.  Do not share needles.  Ask your health care provider for help if you need support or information about quitting drugs.  Tell your health care provider if you often feel depressed.  Tell your health care provider if you have ever been abused or do not feel safe at home.   This information is not intended to replace advice given to you by your health care provider. Make sure you discuss any questions you have with your health care provider.   Document Released: 04/16/2011 Document Revised: 10/22/2014 Document Reviewed: 09/02/2013 [employer] Interactive Patient Education [employer].

## 2015-10-31 NOTE — Progress Notes (Signed)
Pre visit review using our clinic review tool, if applicable. No additional management support is needed unless otherwise documented below in the visit note.  Chief Complaint  Patient presents with  . Annual Exam    gyne exam labs     HPI: Patient  Angelica Gray  55 y.o. comes in today for [place] visit  Had hh surgery nov  Losing weight  lsi stoll trying  Due for thyroid check will need refill. Anemia  On meds iron for cameron erosions   Ran out of iron for a week. Still on ppi per dr Carlean Purl and surgery  On otc vit d   Hx of low levels  Is psot menopausal due for pap .   Health Maintenance  Topic Date Due  . Hepatitis C Screening  10/30/2016 (Originally April 14, 1961)  . HIV Screening  10/30/2016 (Originally 09/28/1976)  . INFLUENZA VACCINE  05/15/2016  . PAP SMEAR  06/18/2016  . MAMMOGRAM  08/02/2017  . TETANUS/TDAP  11/06/2017  . COLONOSCOPY  01/31/2025   Health Maintenance Review LIFESTYLE:  Exercise:  Not recentlyu Tobacco/ETS:n Alcohol: per day n Sugar beverages: Sleep: osa under rx  Drug use: no  ROS:  GEN/ HEENT: No fever, significant weight changes sweats headaches vision problems hearing changes, CV/ PULM; No chest pain shortness of breath cough, syncope,edema  change in exercise tolerance. GI /GU: No adominal pain, vomiting, change in bowel habits. No blood in the stool. No significant GU symptoms. SKIN/HEME: ,no acute skin rashes suspicious lesions or bleeding. No lymphadenopathy, nodules, masses.  NEURO/ PSYCH:  No neurologic signs such as weakness numbness. No depression anxiety. IMM/ Allergy: No unusual infections.  Allergy .   REST of 12 system review negative except as per HPI   Past Medical History  Diagnosis Date  . Hypothyroidism   . Insomnia   . Hyperlipidemia   . Atrophic kidney left  . History of renal stone 2004  . Iron deficiency anemia   . RESTLESS LEG SYNDROME, MILD 11/07/2007    Qualifier: Diagnosis of  By: Arnoldo Morale MD,  Balinda Quails   . 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 02/01/2015  . Sleep apnea     no CPAP   . History of hiatal hernia   . Arthritis     Past Surgical History  Procedure Laterality Date  . Kidney stone surgery    . Tubal ligation  2001  . Mandible fracture surgery    . Colonoscopy    . Esophagogastroduodenoscopy    . Refractive surgery    . Hiatal hernia repair N/A 09/05/2015    Procedure: LAPAROSCOPIC REPAIR OF HIATAL HERNIA;  Surgeon: Alphonsa Overall, MD;  Location: [facility];  Service: General;  Laterality: N/A;    Family History  Problem Relation Age of Onset  . Kidney failure Mother     from dm and ht  had 11 children   . Hyperlipidemia Mother   . Diabetes Mother   . Thyroid disease Mother   . Diabetes Father   . Heart attack Father     dec age 44  . Thyroid disease Sister   . Throat cancer Paternal Grandmother     dipped stuff  . Esophageal cancer Paternal Grandmother   . Colon cancer Neg Hx   . Stomach cancer Neg Hx     Social History   Social History  . Marital Status: Married    Spouse Name: N/A  . Number of Children: 4  .  Years of Education: N/A   Occupational History  . special events planner     [employer]   Social History Main Topics  . Smoking status: Never Smoker   . Smokeless tobacco: Never Used  . Alcohol Use: No  . Drug Use: No  . Sexual Activity: No   Other Topics Concern  . 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 Cholecalciferol (VITAMIN D PO) Take 1 tablet by mouth daily.     . Ferrous Sulfate (IRON) 325 (65 FE) MG TABS Take 325 mg by mouth daily.     Marland Kitchen levothyroxine (SYNTHROID, LEVOTHROID) 112 MCG tablet TAKE ONE TABLET BY MOUTH ONCE DAILY 90 tablet 0  . pantoprazole (PROTONIX) 40 MG tablet Take 1 tablet (40 mg  total) by mouth daily. 90 tablet 3  . ondansetron (ZOFRAN-ODT) 4 MG disintegrating tablet Take 1 tablet (4 mg total) by mouth every 6 (six) hours as needed for nausea. 20 tablet 0  . oxyCODONE (ROXICODONE) 5 MG/5ML solution Take 5 mLs (5 mg total) by mouth every 6 (six) hours as needed for moderate pain or severe pain. 200 mL 0   No facility-administered medications prior to visit.     EXAM:  BP 116/78 mmHg  Temp(Src) 97.5 F (36.4 C) (Oral)  Ht 5' 5"  (1.651 m)  Wt 207 lb 9.6 oz (94.167 kg)  BMI 34.55 kg/m2  Body mass index is 34.55 kg/(m^2).  Physical Exam: Vital signs reviewed TMA:UQJF is a well-developed well-nourished alert cooperative    who appearsr stated age in no acute distress.  HEENT: normocephalic atraumatic , Eyes: PERRL EOM's full, conjunctiva clear, Nares: paten,t no deformity discharge or tenderness., Ears: no deformity EAC's clear TMs with normal landmarks. Mouth: clear OP, no lesions, edema.  Moist mucous membranes. Dentition in adequate repair. NECK: supple   Goiter ? If nodular on left   No pain noadenopathy   or bruits. CHEST/PULM:  Clear to auscultation and percussion breath sounds equal no wheeze , rales or rhonchi. No chest wall deformities or tenderness.Breast: normal by inspection . No dimpling, discharge, masses, tenderness or discharge . CV: PMI is nondisplaced, S1 S2 no gallops, murmurs, rubs. Peripheral pulses are full without delay.No JVD .  ABDOMEN: Bowel sounds normal nontender  No guard or rebound, no hepato splenomegal no CVA tenderness.  No hernia. Extremtities:  No clubbing cyanosis or edema, no acute joint swelling or redness no focal atrophy NEURO:  Oriented x3, cranial nerves 3-12 appear to be intact, no obvious focal weakness,gait within normal limits no abnormal reflexes or asymmetrical SKIN: No acute rashes normal turgor, color, no bruising or petechiae. PSYCH: Oriented, good eye contact, no obvious depression anxiety, cognition and judgment  appear normal. LN: no cervical axillary inguinal adenopathy Pelvic: NL ext GU, labia clear without lesions or rash . Vagina no lesions .Cervix: clear  UTERUS: Neg CMT Adnexa:  clear no masses . PAP done HIgh risk HPV  rectal neg heme  Neg masses   Rectocele   ASSESSMENT AND PLAN:  Discussed the following assessment and plan:  Visit for preventive health examination - Plan: Basic metabolic panel, CBC with Differential/Platelet, Hepatic function panel, Lipid panel, TSH, IBC panel, Vitamin D, 25-hydroxy, Ferritin, PAP [  [place]]  Encounter for routine gynecological examination - Plan: PAP [[place]]  Hypothyroidism, unspecified hypothyroidism type - Plan: Basic metabolic panel, CBC with Differential/Platelet, Hepatic function panel, Lipid panel, TSH, IBC panel, Vitamin D, 25-hydroxy, Ferritin  OSA (obstructive sleep apnea) - Plan: Basic metabolic panel, CBC with Differential/Platelet, Hepatic function panel, Lipid panel, TSH, IBC panel, Vitamin D, 25-hydroxy, Ferritin  Vitamin D deficiency - labs today - Plan: Vitamin D, 25-hydroxy  Iron deficiency - Plan: CBC with Differential/Platelet, IBC panel, Ferritin  Goiter - Plan: US Soft Tissue Head/Neck  Renal atrophy, unspecified laterality  RLS (restless legs syndrome) - some improved after surgery and rx  will follow check ferritin l Labs today [place] of neck  Plan fu depending on  Labs etc   Continue healthy weight loss  Pt will send inform for work  Patient Care Team: Burnis Medin, MD as PCP - General (Internal Medicine) Gatha Mayer, MD as Consulting Physician (Gastroenterology) Patient Instructions   Continue lifestyle intervention healthy eating and exercise . Ask surgeon gi about how  Long you need to stay on the protonix.  Continue healthy weight loss . Will be contacted about lab and PAP results  And also  appt for ultrasound of you neck to check your thyroid for nodules .   Wt Readings from Last 3 Encounters:  10/31/15  207 lb 9.6 oz (94.167 kg)  09/12/15 220 lb (99.791 kg)  09/05/15 222 lb (100.699 kg)   Health Maintenance, Female Adopting a healthy lifestyle and getting preventive care can go a long way to promote health and wellness. Talk with your health care provider about what schedule of regular examinations is right for you. This is a good chance for you to check in with your provider about disease prevention and staying healthy. In between checkups, there are plenty of things you can do on your own. Experts have done a lot of research about which lifestyle changes and preventive measures are most likely to keep you healthy. Ask your health care provider for more information. WEIGHT AND DIET  Eat a healthy diet  Be sure to include plenty of vegetables, fruits, low-fat dairy products, and lean protein.  Do not eat a lot of foods high in solid fats, added sugars, or salt.  Get regular exercise. This is one of the most important things you can do for your health.  Most adults should exercise for at least 150 minutes each week. The exercise should increase your heart rate and make you sweat (moderate-intensity exercise).  Most adults should also do strengthening exercises at least twice a week. This is in addition to the moderate-intensity exercise.  Maintain a healthy weight  Body mass index (BMI) is a measurement that can be used to identify possible weight problems. It estimates body fat based on height and weight. Your health care provider can help determine your BMI and help you achieve or maintain a healthy weight.  For females 96 years of age and older:   A BMI below 18.5 is considered underweight.  A BMI of 18.5 to 24.9 is normal.  A BMI of 25 to 29.9 is considered overweight.  A BMI of 30 and above is considered obese.  Watch levels of cholesterol and blood lipids  You should start having your blood tested for lipids and cholesterol at 55 years of age, then have this test every 5  years.  You may need to have your cholesterol levels checked more often if:  Your lipid  or cholesterol levels are high.  You are older than 55 years of age.  You are at high risk for heart disease.  CANCER SCREENING   Lung Cancer  Lung cancer screening is recommended for adults 64-73 years old who are at high risk for lung cancer because of a history of smoking.  A yearly low-dose CT scan of the lungs is recommended for people who:  Currently smoke.  Have quit within the past 15 years.  Have at least a 30-pack-year history of smoking. A pack year is smoking an average of one pack of cigarettes a day for 1 year.  Yearly screening should continue until it has been 15 years since you quit.  Yearly screening should stop if you develop a health problem that would prevent you from having lung cancer treatment.  Breast Cancer  Practice breast self-awareness. This means understanding how your breasts normally appear and feel.  It also means doing regular breast self-exams. Let your health care provider know about any changes, no matter how small.  If you are in your 20s or 30s, you should have a clinical breast exam (CBE) by a health care provider every 1-3 years as part of a regular health exam.  If you are 90 or older, have a CBE every year. Also consider having a breast X-ray (mammogram) every year.  If you have a family history of breast cancer, talk to your health care provider about genetic screening.  If you are at high risk for breast cancer, talk to your health care provider about having an MRI and a mammogram every year.  Breast cancer gene (BRCA) assessment is recommended for women who have family members with BRCA-related cancers. BRCA-related cancers include:  Breast.  Ovarian.  Tubal.  Peritoneal cancers.  Results of the assessment will determine the need for genetic counseling and BRCA1 and BRCA2 testing. Cervical Cancer Your health care provider may  recommend that you be screened regularly for cancer of the pelvic organs (ovaries, uterus, and vagina). This screening involves a pelvic examination, including checking for microscopic changes to the surface of your cervix (Pap test). You may be encouraged to have this screening done every 3 years, beginning at age 25.  For women ages 39-65, health care providers may recommend pelvic exams and Pap testing every 3 years, or they may recommend the Pap and pelvic exam, combined with testing for human papilloma virus (HPV), every 5 years. Some types of HPV increase your risk of cervical cancer. Testing for HPV may also be done on women of any age with unclear Pap test results.  Other health care providers may not recommend any screening for nonpregnant women who are considered low risk for pelvic cancer and who do not have symptoms. Ask your health care provider if a screening pelvic exam is right for you.  If you have had past treatment for cervical cancer or a condition that could lead to cancer, you need Pap tests and screening for cancer for at least 20 years after your treatment. If Pap tests have been discontinued, your risk factors (such as having a new sexual partner) need to be reassessed to determine if screening should resume. Some women have medical problems that increase the chance of getting cervical cancer. In these cases, your health care provider may recommend more frequent screening and Pap tests. Colorectal Cancer  This type of cancer can be detected and often prevented.  Routine colorectal cancer screening usually begins at 55 years of age and  continues through 55 years of age.  Your health care provider may recommend screening at an earlier age if you have risk factors for colon cancer.  Your health care provider may also recommend using home test kits to check for hidden blood in the stool.  A small camera at the end of a tube can be used to examine your colon directly  (sigmoidoscopy or colonoscopy). This is done to check for the earliest forms of colorectal cancer.  Routine screening usually begins at age 86.  Direct examination of the colon should be repeated every 5-10 years through 55 years of age. However, you may need to be screened more often if early forms of precancerous polyps or small growths are found. Skin Cancer  Check your skin from head to toe regularly.  Tell your health care provider about any new moles or changes in moles, especially if there is a change in a mole's shape or color.  Also tell your health care provider if you have a mole that is larger than the size of a pencil eraser.  Always use sunscreen. Apply sunscreen liberally and repeatedly throughout the day.  Protect yourself by wearing long sleeves, pants, a wide-brimmed hat, and sunglasses whenever you are outside. HEART DISEASE, DIABETES, AND HIGH BLOOD PRESSURE   High blood pressure causes heart disease and increases the risk of stroke. High blood pressure is more likely to develop in:  People who have blood pressure in the high end of the normal range (130-139/85-89 mm Hg).  People who are overweight or obese.  People who are African American.  If you are 74-98 years of age, have your blood pressure checked every 3-5 years. If you are 45 years of age or older, have your blood pressure checked every year. You should have your blood pressure measured twice--once when you are at a hospital or clinic, and once when you are not at a hospital or clinic. Record the average of the two measurements. To check your blood pressure when you are not at a hospital or clinic, you can use:  An automated blood pressure machine at a pharmacy.  A home blood pressure monitor.  If you are between 72 years and 29 years old, ask your health care provider if you should take aspirin to prevent strokes.  Have regular diabetes screenings. This involves taking a blood sample to check your  fasting blood sugar level.  If you are at a normal weight and have a low risk for diabetes, have this test once every three years after 55 years of age.  If you are overweight and have a high risk for diabetes, consider being tested at a younger age or more often. PREVENTING INFECTION  Hepatitis B  If you have a higher risk for hepatitis B, you should be screened for this virus. You are considered at high risk for hepatitis B if:  You were born in a country where hepatitis B is common. Ask your health care provider which countries are considered high risk.  Your parents were born in a high-risk country, and you have not been immunized against hepatitis B (hepatitis B vaccine).  You have HIV or AIDS.  You use needles to inject street drugs.  You live with someone who has hepatitis B.  You have had sex with someone who has hepatitis B.  You get hemodialysis treatment.  You take certain medicines for conditions, including cancer, organ transplantation, and autoimmune conditions. Hepatitis C  Blood testing is recommended for:  Everyone born from [birth year] through 1965.  Anyone with known risk factors for hepatitis C. Sexually transmitted infections (STIs)  You should be screened for sexually transmitted infections (STIs) including gonorrhea and chlamydia if:  You are sexually active and are younger than 55 years of age.  You are older than 55 years of age and your health care provider tells you that you are at risk for this type of infection.  Your sexual activity has changed since you were last screened and you are at an increased risk for chlamydia or gonorrhea. Ask your health care provider if you are at risk.  If you do not have HIV, but are at risk, it may be recommended that you take a prescription medicine daily to prevent HIV infection. This is called pre-exposure prophylaxis (PrEP). You are considered at risk if:  You are sexually active and do not regularly use condoms or  know the HIV status of your partner(s).  You take drugs by injection.  You are sexually active with a partner who has HIV. Talk with your health care provider about whether you are at high risk of being infected with HIV. If you choose to begin PrEP, you should first be tested for HIV. You should then be tested every 3 months for as long as you are taking PrEP.  PREGNANCY   If you are premenopausal and you may become pregnant, ask your health care provider about preconception counseling.  If you may become pregnant, take 400 to 800 micrograms (mcg) of folic acid every day.  If you want to prevent pregnancy, talk to your health care provider about birth control (contraception). OSTEOPOROSIS AND MENOPAUSE   Osteoporosis is a disease in which the bones lose minerals and strength with aging. This can result in serious bone fractures. Your risk for osteoporosis can be identified using a bone density scan.  If you are 52 years of age or older, or if you are at risk for osteoporosis and fractures, ask your health care provider if you should be screened.  Ask your health care provider whether you should take a calcium or vitamin D supplement to lower your risk for osteoporosis.  Menopause may have certain physical symptoms and risks.  Hormone replacement therapy may reduce some of these symptoms and risks. Talk to your health care provider about whether hormone replacement therapy is right for you.  HOME CARE INSTRUCTIONS   Schedule regular health, dental, and eye exams.  Stay current with your immunizations.   Do not use any tobacco products including cigarettes, chewing tobacco, or electronic cigarettes.  If you are pregnant, do not drink alcohol.  If you are breastfeeding, limit how much and how often you drink alcohol.  Limit alcohol intake to no more than 1 drink per day for nonpregnant women. One drink equals 12 ounces of beer, 5 ounces of wine, or 1 ounces of hard liquor.  Do  not use street drugs.  Do not share needles.  Ask your health care provider for help if you need support or information about quitting drugs.  Tell your health care provider if you often feel depressed.  Tell your health care provider if you have ever been abused or do not feel safe at home.   This information is not intended to replace advice given to you by your health care provider. Make sure you discuss any questions you have with your health care provider.   Document Released: 04/16/2011 Document Revised: 10/22/2014 Document Reviewed: 09/02/2013 Elsevier Interactive  Patient Education 2016 [place] Jahmani Staup M.D.  Lab Results  Component Value Date   WBC 6.9 09/07/2015   HGB 12.1 09/07/2015   HCT 36.3 09/07/2015   PLT 131* 09/07/2015   GLUCOSE 107* 09/02/2015   CHOL 175 11/12/2014   TRIG 111.0 11/12/2014   HDL 50.80 11/12/2014   LDLDIRECT 122.0 02/15/2010   LDLCALC 102* 11/12/2014   ALT 20 10/13/2014   AST 18 10/13/2014   NA 140 09/02/2015   K 4.3 09/02/2015   CL 108 09/02/2015   CREATININE 0.99 09/02/2015   BUN 18 09/02/2015   CO2 27 09/02/2015   TSH 1.21 10/13/2014   INR 1.02 04/18/2010   HGBA1C 5.8 06/02/2015

## 2015-11-01 ENCOUNTER — Other Ambulatory Visit: Payer: Self-pay | Admitting: Internal Medicine

## 2015-11-01 MED ORDER — LEVOTHYROXINE SODIUM 112 MCG PO TABS: 112.0000 ug | ORAL_TABLET | Freq: Every day | ORAL | Status: DC

## 2015-11-02 LAB — CYTOLOGY - PAP

## 2015-11-03 NOTE — Progress Notes (Signed)
Quick Note:  Tell patient PAP is normal. HPV high risk is negative ______ 

## 2015-11-16 ENCOUNTER — Ambulatory Visit (INDEPENDENT_AMBULATORY_CARE_PROVIDER_SITE_OTHER): Payer: BLUE CROSS/BLUE SHIELD | Admitting: Internal Medicine

## 2015-11-16 ENCOUNTER — Encounter: Payer: Self-pay | Admitting: Internal Medicine

## 2015-11-16 VITALS — BP 112/80 | HR 74 | Ht 66.5 in | Wt 202.8 lb

## 2015-11-16 DIAGNOSIS — G4733 Obstructive sleep apnea (adult) (pediatric): Secondary | ICD-10-CM | POA: Diagnosis not present

## 2015-11-16 NOTE — Patient Instructions (Signed)
We will call you tomorrow to set up your trial of  cpap and mask of choice and follow up with a sleep medicine specialist to fine tune your equipment

## 2015-11-16 NOTE — Progress Notes (Signed)
Subjective:    Patient ID: Angelica Gray, female    DOB: [birth year]    MRN: [id number]  HPI   12 yowf never smoker with longstanding snoring and sleep disruption  referred to pulmonary clinic 11/16/2015 by Dr Dr Fabian Sharp p PSG 09/12/15 c/w severe OSA    11/16/2015 1st [facility] office visit/ [facility]   Chief Complaint  Patient presents with  . Sleep Consult    Referred by Dr. Fabian Sharp for eval of OSA. Pt had PSG done on 09/12/15.  Epworth score= 20.   Sleep Questionnaire:  What time do you typically go to bed?   10 pm - 1 pm   How long does it take you to fall asleep?  5-15 min  How many times during the night do you wake up?   5-6 What time do you get out of bed to start your day?  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   Lost  22 lb  Have you ever had a sleep study before?   yes    If yes, location of study?   [facility]   If yes, date of study?   09/12/15 7 days post op  Do you currently use CPAP?   No  If so, what pressure?   n/a   Do you wear oxygen at any time?  no Epworth score   20     No obvious day to day or daytime variabilty or assoc chronic cough or sob/ cp or chest tightness, subjective wheeze overt sinus or hb symptoms. No unusual exp hx or h/o childhood pna/ asthma or knowledge of premature birth.  Sleeping ok without nocturnal  or early am exacerbation  of respiratory  c/o's or need for noct saba. Also denies any obvious fluctuation of symptoms with weather or environmental changes or other aggravating or alleviating factors except as outlined above   Current Medications, Allergies, Complete Past Medical History, Past Surgical History, Family History, and Social History were reviewed in [employer] record.           Review of Systems  Constitutional: Negative for fever, chills and unexpected weight change.  HENT: Positive for  trouble swallowing. Negative for congestion, dental problem, ear pain, nosebleeds, postnasal drip, rhinorrhea, sinus pressure, sneezing, sore throat and voice change.   Eyes: Negative for visual disturbance.  Respiratory: Negative for cough, choking and shortness of breath.   Cardiovascular: Negative for chest pain and leg swelling.  Gastrointestinal: Negative for vomiting, abdominal pain and diarrhea.  Genitourinary: Negative for difficulty urinating.  Musculoskeletal: Negative for arthralgias.  Skin: Negative for rash.  Neurological: Negative for tremors, syncope and headaches.  Hematological: Does not bruise/bleed easily.       Objective:   Physical Exam   amb obese wf nad  Wt Readings from Last 3 Encounters:  11/16/15 202 lb 12.8 oz (91.989 kg)  10/31/15 207 lb 9.6 oz (94.167 kg)  09/12/15 220 lb (99.791 kg)    Vital signs reviewed   HEENT: nl dentition, turbinates, and oropharynx. Nl external ear canals without cough reflex Modified Mallampati Score = 3/4     NECK :  without JVD/Nodes/TM/ nl carotid upstrokes bilaterally   LUNGS: no acc muscle use,  Nl contour chest which is clear to A and P bilaterally without cough on insp or exp maneuvers  CV:  RRR  no s3 or murmur or increase in P2, no edema   ABD:  soft and nontender with nl inspiratory excursion in the supine position. No bruits or organomegaly, bowel sounds nl  MS:  Nl gait/ ext warm without deformities, calf tenderness, cyanosis or clubbing No obvious joint restrictions   SKIN: warm and dry without lesions    NEURO:  alert, approp, nl sensorium with  no motor deficits         Assessment & Plan:

## 2015-11-17 ENCOUNTER — Telehealth: Payer: Self-pay | Admitting: *Deleted

## 2015-11-17 ENCOUNTER — Encounter: Payer: Self-pay | Admitting: Internal Medicine

## 2015-11-17 DIAGNOSIS — G4733 Obstructive sleep apnea (adult) (pediatric): Secondary | ICD-10-CM | POA: Insufficient documentation

## 2015-11-17 NOTE — Telephone Encounter (Signed)
-----   Message from Nyoka Cowden, MD sent at 11/17/2015  8:41 AM EST ----- Let her know I discussed with Dr Maple Hudson and orderes placed to start cpap and arrange sleep doc/ call me until she establishes with any questions

## 2015-11-17 NOTE — Assessment & Plan Note (Addendum)
PSG 09/12/15 :  AHI 77/ 64.5 during REM complicated by moderate hypersomnolence and assoc with BMI > 30 c/w morbid obesity so needs wt loss and trial of cpap/ f/u sleep medicine   Discussed with Dr Maple Hudson who read the study rec autoset cpap 5-20/ mask of choice   Etiology and pathophysiology of osa including relationship to obesity reviewed in detail  With pt who agrees to proceed with trial of cpap/ mask of choice and f/u with sleep medicine next   Total time devoted to counseling  = 35/72m review case with pt/ discussion of options/alternatives/ personally creating in presence of pt  then going over specific  Instructions directly with the pt including how to use all of the meds but in particular covering each new medication in detail (see avs)

## 2015-11-17 NOTE — Assessment & Plan Note (Signed)
Complicated by OSA severe documented 09/12/15   Body mass index is 32.25   Lab Results  Component Value Date   TSH 0.88 10/31/2015     Contributing to gerd tendency/ doe/reviewed the need and the process to achieve and maintain neg calorie balance > defer f/u primary care including intermittently monitoring thyroid status

## 2015-11-17 NOTE — Telephone Encounter (Signed)
Pt aware of recs per MW I have scheduled f/u with VS  Nothing further needed

## 2015-11-18 ENCOUNTER — Ambulatory Visit
Admission: RE | Admit: 2015-11-18 | Discharge: 2015-11-18 | Disposition: A | Discharge status: Home / Self Care | Payer: BLUE CROSS/BLUE SHIELD | Source: Ambulatory Visit | Attending: Internal Medicine | Admitting: Internal Medicine

## 2015-11-18 DIAGNOSIS — E049 Nontoxic goiter, unspecified: Secondary | ICD-10-CM

## 2015-11-27 ENCOUNTER — Other Ambulatory Visit: Payer: Self-pay | Admitting: Internal Medicine

## 2015-11-29 ENCOUNTER — Telehealth: Payer: Self-pay | Admitting: Internal Medicine

## 2015-11-29 NOTE — Telephone Encounter (Signed)
[facility] slots on 12/23/15

## 2015-11-29 NOTE — Telephone Encounter (Signed)
Pt daughter has wcc on 01-13-16. Pt mom would like wcc sooner that 01-13-16. Can I Create 30 min slot?

## 2015-11-29 NOTE — Telephone Encounter (Signed)
Pt daughter has been sch °

## 2015-12-08 ENCOUNTER — Encounter: Payer: Self-pay | Admitting: Internal Medicine

## 2015-12-08 ENCOUNTER — Ambulatory Visit (INDEPENDENT_AMBULATORY_CARE_PROVIDER_SITE_OTHER): Payer: BLUE CROSS/BLUE SHIELD | Admitting: Internal Medicine

## 2015-12-08 VITALS — BP 116/80 | Temp 97.6°F | Wt 203.5 lb

## 2015-12-08 DIAGNOSIS — E049 Nontoxic goiter, unspecified: Secondary | ICD-10-CM | POA: Diagnosis not present

## 2015-12-08 DIAGNOSIS — E039 Hypothyroidism, unspecified: Secondary | ICD-10-CM | POA: Diagnosis not present

## 2015-12-08 DIAGNOSIS — E559 Vitamin D deficiency, unspecified: Secondary | ICD-10-CM | POA: Diagnosis not present

## 2015-12-08 NOTE — Patient Instructions (Signed)
Follow up yearly check up and thyroid exam as planned

## 2015-12-08 NOTE — Assessment & Plan Note (Signed)
Follow exam yearly consider repeat US in a  year

## 2015-12-08 NOTE — Progress Notes (Signed)
Pre visit review using our clinic review tool, if applicable. No additional management support is needed unless otherwise documented below in the visit note.   Chief Complaint  Patient presents with  . Follow-up    form for [employer] where she works [facility]    HPI:  Hs aform for  [employer] of 3 years [employer]  . No sx but  Last tb test over a year ago  Gets at [facility] or onsite .   Had varicella as a child.  Went off iron as discussed .  Had [place]  Has had goiter "for years :  Has gi issues  On  Probiotics  Has fu visit  In another month. Still gets some gas and loose stools.  Taking vit d 2000 iu per day  ROS: See pertinent positives and negatives per HPI.  Past Medical History  Diagnosis Date  . Hypothyroidism   . Insomnia   . Hyperlipidemia   . Atrophic kidney left  . History of renal stone 2004  . Iron deficiency anemia   . RESTLESS LEG SYNDROME, MILD 11/07/2007    Qualifier: Diagnosis of  By: Lovell Sheehan MD, Balinda Quails   . 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 02/01/2015  . Sleep apnea     no CPAP   . History of hiatal hernia   . Arthritis     Family History  Problem Relation Age of Onset  . Kidney failure Mother     from dm and ht  had 11 children   . Hyperlipidemia Mother   . Diabetes Mother   . Thyroid disease Mother   . Diabetes Father   . Heart attack Father     dec age 64  . Thyroid disease Sister   . Throat cancer Paternal Grandmother     dipped stuff  . Esophageal cancer Paternal Grandmother   . Colon cancer Neg Hx   . Stomach cancer Neg Hx     Social History   Social History  . Marital Status: Married    Spouse Name: N/A  . Number of Children: 4  . Years of Education: N/A   Occupational History  . special events planner     [employer]   Social History Main Topics  . Smoking status: Never Smoker   . Smokeless tobacco: Never Used  . Alcohol Use: No  . Drug Use: No  . Sexual Activity: No   Other  Topics Concern  . 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 levothyroxine (SYNTHROID, LEVOTHROID) 112 MCG tablet Take 1 tablet (112 mcg total) by mouth daily. 90 tablet 3  . pantoprazole (PROTONIX) 40 MG tablet TAKE ONE TABLET BY MOUTH ONCE DAILY 90 tablet 0  . Cholecalciferol (VITAMIN D PO) Take 1 tablet by mouth daily.     . Ferrous Sulfate (IRON) 325 (65 FE) MG TABS Take 325 mg by mouth daily.      No facility-administered medications prior to visit.     EXAM:  BP 116/80 mmHg  Temp(Src) 97.6 F (36.4 C) (Oral)  Wt 203 lb 8 oz (92.307 kg)  Body mass index is 32.36 kg/(m^2).  GENERAL: vitals reviewed  and listed above, alert, oriented, appears well hydrated and in no acute distress HEENT: atraumatic, conjunctiva  clear, no obvious abnormalities on inspection of external nose and ears NECK: no obvious masses on inspection    Mod goiter no nodules on palpation L PSYCH: pleasant and cooperative, no obvious depression or anxiety Lab review  Lab Results  Component Value Date   WBC 4.1 10/31/2015   HGB 13.6 10/31/2015   HCT 40.7 10/31/2015   PLT 155.0 10/31/2015   GLUCOSE 100* 10/31/2015   CHOL 172 10/31/2015   TRIG 52.0 10/31/2015   HDL 43.20 10/31/2015   LDLDIRECT 122.0 02/15/2010   LDLCALC 118* 10/31/2015   ALT 24 10/31/2015   AST 27 10/31/2015   NA 142 10/31/2015   K 4.6 10/31/2015   CL 106 10/31/2015   CREATININE 1.00 10/31/2015   BUN 17 10/31/2015   CO2 25 10/31/2015   TSH 0.88 10/31/2015   INR 1.02 04/18/2010   HGBA1C 5.8 06/02/2015   Neg thyroid aby  ASSESSMENT AND PLAN:  Discussed the following assessment and plan:  Goiter  Hypothyroidism, unspecified hypothyroidism type  Vitamin D deficiency Form completed   Let us know if need to get varicella  titer or ppd     Fu with gi  As planned  Check into fodmap diet also.  Off   For now as .has hh surgery  And hopefully no more blood loss -Patient advised to return or notify health care team  if symptoms worsen ,persist or new concerns arise.  Patient Instructions  Follow up yearly check up and thyroid exam as planned     Lakera Viall K. Marvell Tamer M.D.

## 2015-12-16 ENCOUNTER — Ambulatory Visit: Payer: BLUE CROSS/BLUE SHIELD | Admitting: Internal Medicine

## 2016-01-31 ENCOUNTER — Ambulatory Visit: Payer: BLUE CROSS/BLUE SHIELD | Admitting: Pulmonary Disease

## 2016-04-03 ENCOUNTER — Other Ambulatory Visit: Payer: Self-pay | Admitting: Internal Medicine

## 2016-07-07 ENCOUNTER — Other Ambulatory Visit: Payer: Self-pay | Admitting: Internal Medicine

## 2016-08-19 IMAGING — US US SOFT TISSUE HEAD/NECK
1 series · 13 of 25 positions shown · non-contrast
Comparison: None.

CLINICAL DATA: 54-year-old female with history of thyroid goiter

EXAM:
THYROID ULTRASOUND
TECHNIQUE: Ultrasound examination of the thyroid gland and adjacent soft
tissues was performed.

[Series 1: us soft tissue head/neck · 0.08mm/px · 13 of 53 slices shown]
[im 1/53]
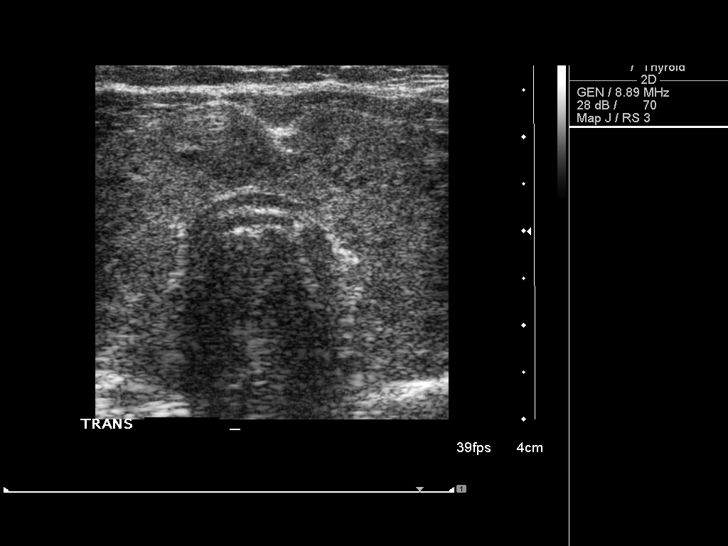
[im 5/53]
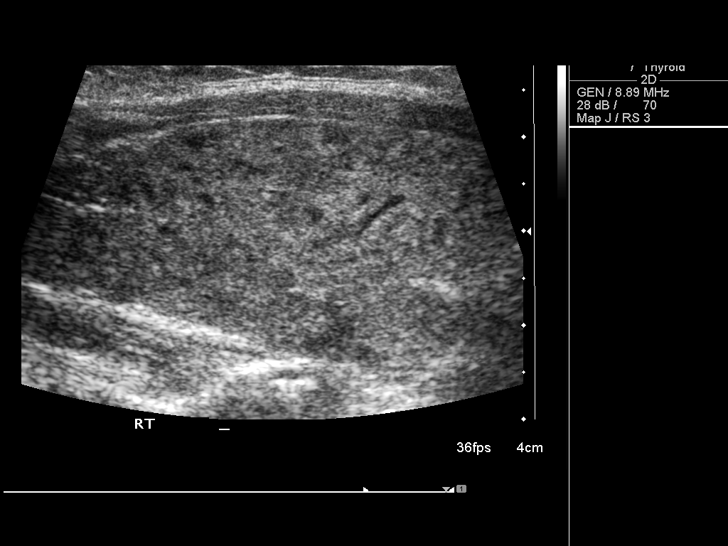
[im 9/53]
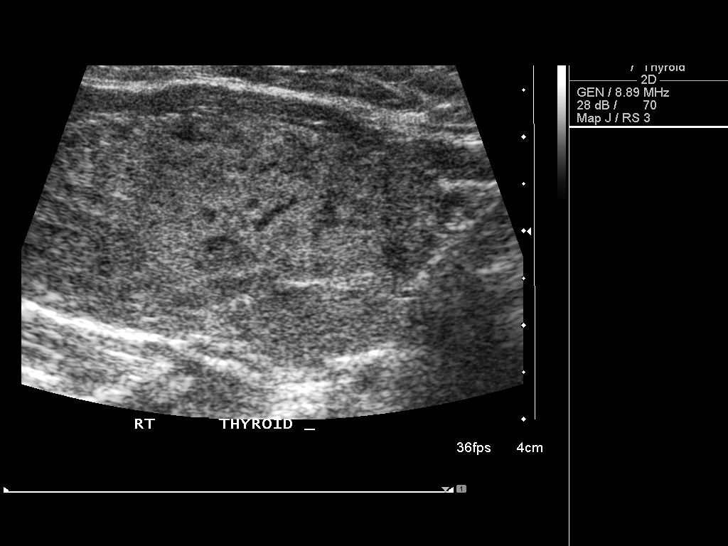
[im 14/53]
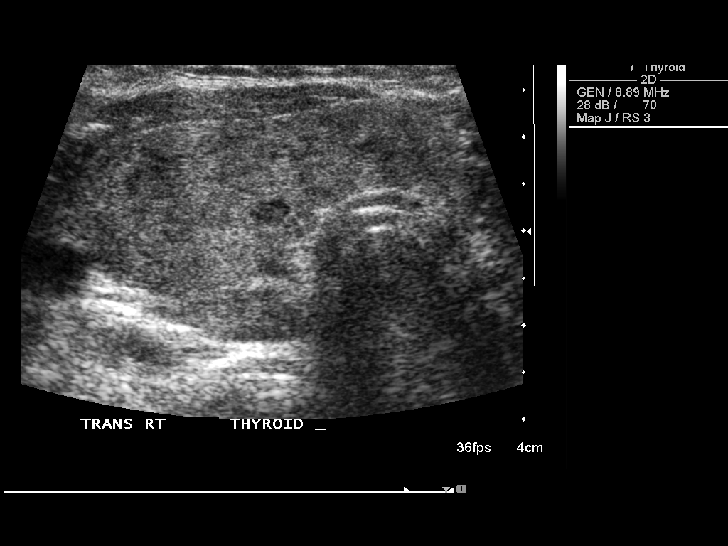
[im 18/53]
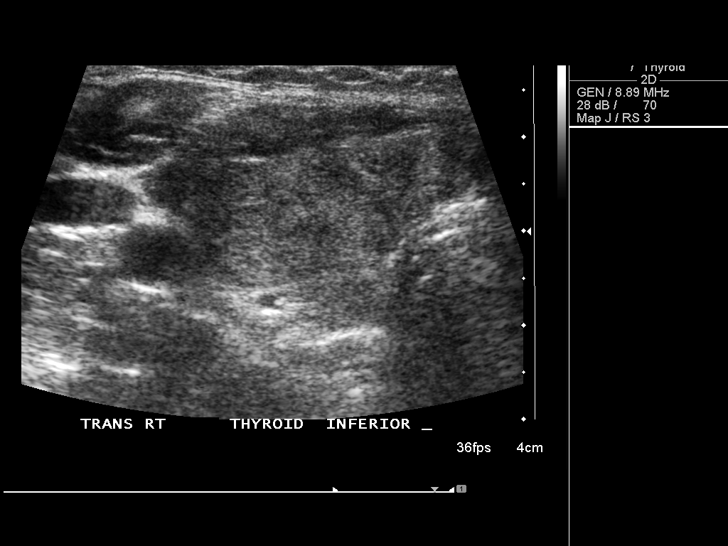
[im 22/53]
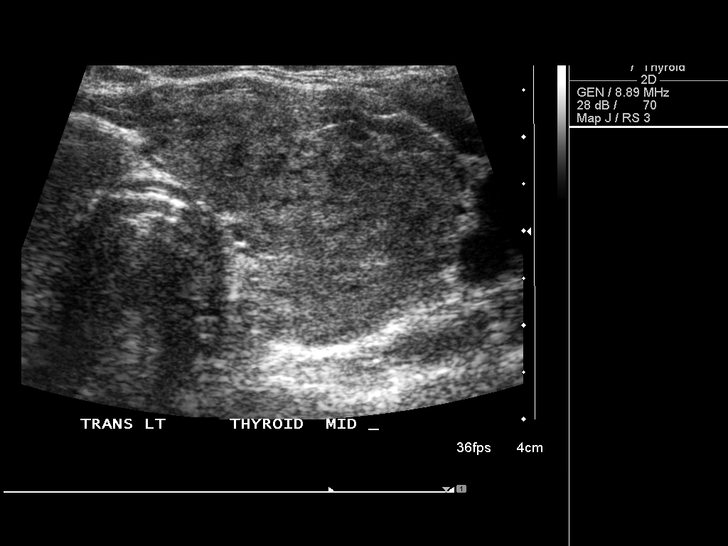
[im 27/53]
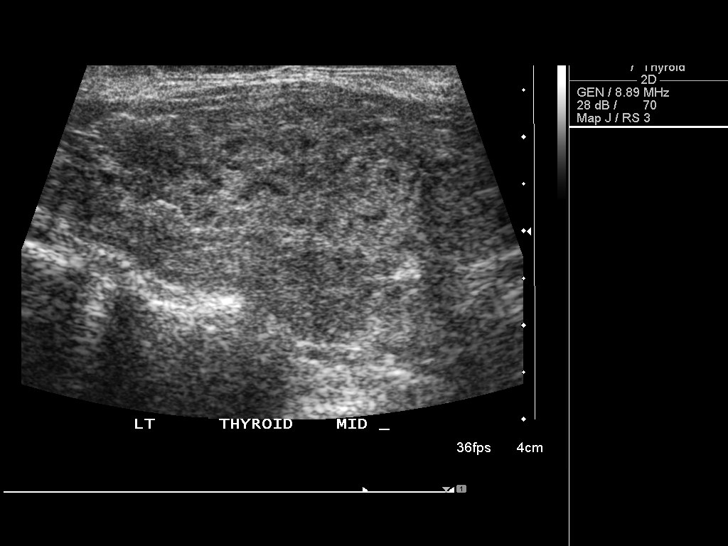
[im 31/53]
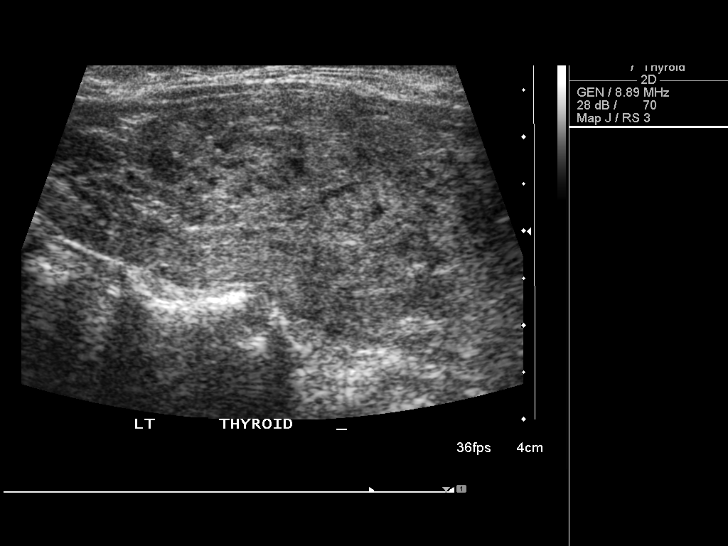
[im 35/53]
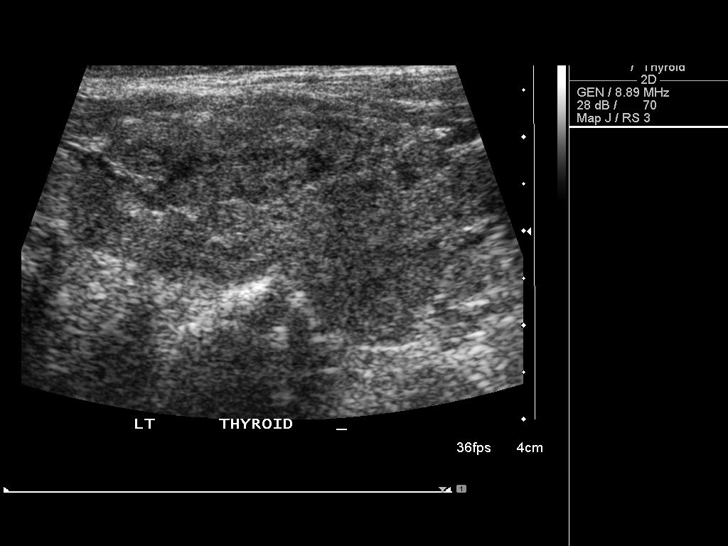
[im 40/53]
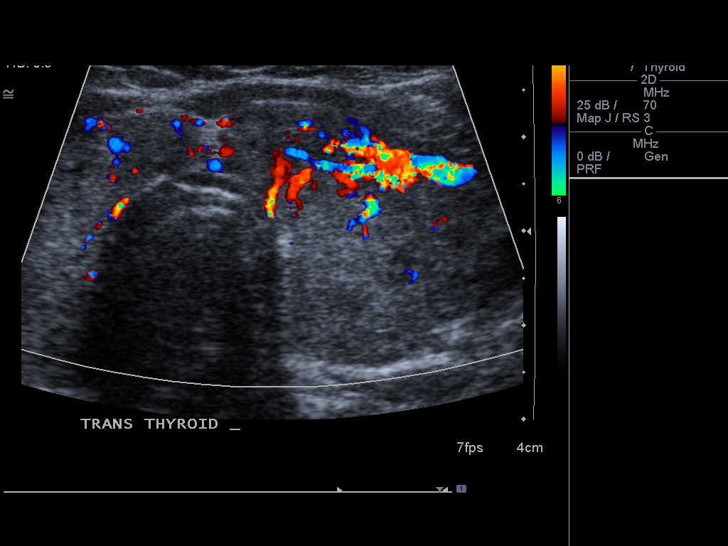
[im 44/53]
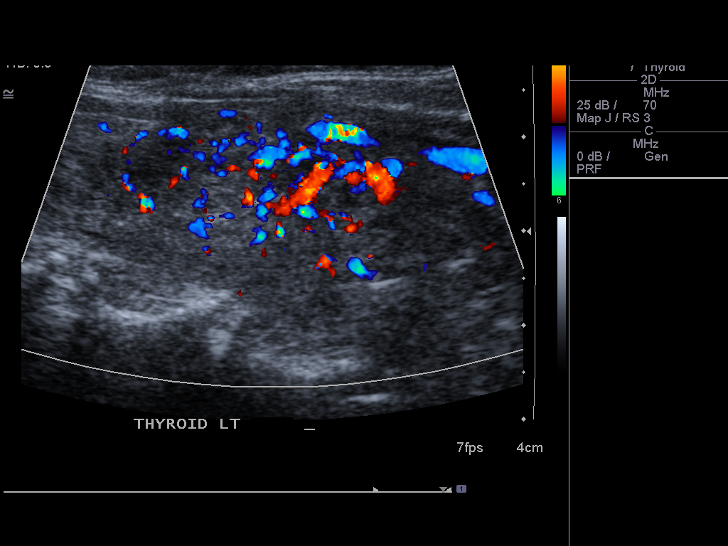
[im 48/53]
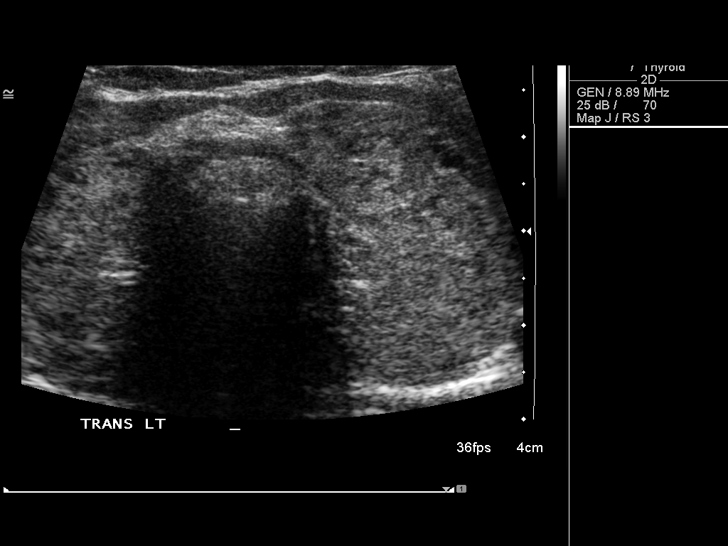
[im 53/53]
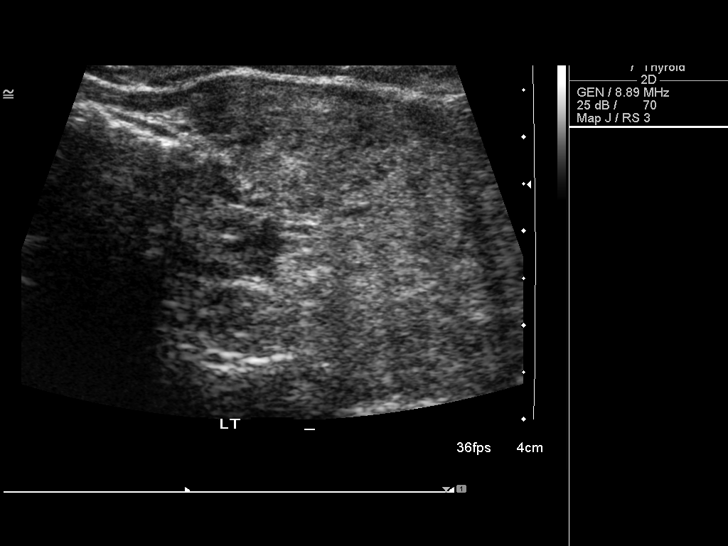

[13 of 25 positions shown; findings below may reference images not displayed]

FINDINGS: Right thyroid lobe

Measurements: 5.6 x 2.8 x 2.6 cm. Enlarged and diffusely
heterogeneous thyroid gland. No discrete nodules visualized.

Left thyroid lobe

Measurements: 5.5 x 2.5 x 2.7 cm. Enlarged and diffusely
heterogeneous thyroid gland. No discrete nodules visualized.

Isthmus

Thickness: 0.7 cm. Small 9 mm hypoechoic nodule at the superior
aspect of the isthmus.

Lymphadenopathy

None visualized.
IMPRESSION: 1. Diffusely enlarged and heterogeneous thyroid gland.
2. Incidental note is made of a small 9 mm hypoechoic nodule versus
pseudo nodule at the superior aspect of the isthmus in the midline.

Findings do not meet current SRU consensus criteria for biopsy.
Follow-up by clinical exam is recommended. If patient has known risk
factors for thyroid carcinoma, consider follow-up ultrasound in 12
months. If patient is clinically hyperthyroid, consider nuclear
medicine thyroid uptake and scan.

Reference: Management of Thyroid Nodules Detected at US: Society of
Radiologists in Ultrasound Consensus Conference Statement. Radiology

## 2016-09-18 ENCOUNTER — Other Ambulatory Visit: Payer: Self-pay | Admitting: Internal Medicine

## 2016-09-20 NOTE — Telephone Encounter (Signed)
Sent to the pharmacy by e-scribe for 90 days.  Has upcoming cpx on 10/29/16.

## 2016-10-24 ENCOUNTER — Other Ambulatory Visit (INDEPENDENT_AMBULATORY_CARE_PROVIDER_SITE_OTHER): Payer: BLUE CROSS/BLUE SHIELD

## 2016-10-24 DIAGNOSIS — Z0000 Encounter for general adult medical examination without abnormal findings: Secondary | ICD-10-CM

## 2016-10-24 LAB — LIPID PANEL
Cholesterol: 184 mg/dL (ref 0–200)
HDL: 55.3 mg/dL (ref >39.00)
LDL Cholesterol: 110 mg/dL — ABNORMAL HIGH (ref 0–99)
NonHDL: 128.87
Total CHOL/HDL Ratio: 3
Triglycerides: 92 mg/dL (ref 0.0–149.0)
VLDL: 18.4 mg/dL (ref 0.0–40.0)

## 2016-10-24 LAB — BASIC METABOLIC PANEL
BUN: 16 mg/dL (ref 6–23)
CHLORIDE: 106 meq/L (ref 96–112)
CO2: 27 meq/L (ref 19–32)
Calcium: 9.4 mg/dL (ref 8.4–10.5)
Creatinine, Ser: 0.99 mg/dL (ref 0.40–1.20)
GFR: 61.88 mL/min (ref >60.00)
GLUCOSE: 122 mg/dL — AB (ref 70–99)
POTASSIUM: 4.5 meq/L (ref 3.5–5.1)
SODIUM: 142 meq/L (ref 135–145)

## 2016-10-24 LAB — CBC WITH DIFFERENTIAL/PLATELET
Basophils Absolute: 0 10*3/uL (ref 0.0–0.1)
Basophils Relative: 0.5 % (ref 0.0–3.0)
Eosinophils Absolute: 0.2 10*3/uL (ref 0.0–0.7)
Eosinophils Relative: 4.5 % (ref 0.0–5.0)
HCT: 42.1 % (ref 36.0–46.0)
Hemoglobin: 14.5 g/dL (ref 12.0–15.0)
Lymphocytes Relative: 29.6 % (ref 12.0–46.0)
Lymphs Abs: 1.4 10*3/uL (ref 0.7–4.0)
MCHC: 34.5 g/dL (ref 30.0–36.0)
MCV: 86.2 fl (ref 78.0–100.0)
Monocytes Absolute: 0.3 10*3/uL (ref 0.1–1.0)
Monocytes Relative: 5.9 % (ref 3.0–12.0)
Neutro Abs: 2.9 10*3/uL (ref 1.4–7.7)
Neutrophils Relative %: 59.5 % (ref 43.0–77.0)
Platelets: 205 10*3/uL (ref 150.0–400.0)
RBC: 4.89 Mil/uL (ref 3.87–5.11)
RDW: 13.1 % (ref 11.5–15.5)
WBC: 4.9 10*3/uL (ref 4.0–10.5)

## 2016-10-24 LAB — HEPATIC FUNCTION PANEL
ALT: 22 U/L (ref 0–35)
AST: 19 U/L (ref 0–37)
Albumin: 4.2 g/dL (ref 3.5–5.2)
Alkaline Phosphatase: 115 U/L (ref 39–117)
Bilirubin, Direct: 0.1 mg/dL (ref 0.0–0.3)
Total Bilirubin: 0.7 mg/dL (ref 0.2–1.2)
Total Protein: 7.2 g/dL (ref 6.0–8.3)

## 2016-10-24 LAB — TSH: TSH: 1.83 u[IU]/mL (ref 0.35–4.50)

## 2016-10-26 NOTE — Progress Notes (Signed)
Pre visit review using our clinic review tool, if applicable. No additional management support is needed unless otherwise documented below in the visit note.  Chief Complaint  Patient presents with  . Annual Exam    HPI: Patient  Angelica Gray  56 y.o. comes in today for Preventive Health Care visit  She is taking her thyroid medicine every day but pretty much at the same time of her Protonix. She is on this medicine because she had Cameron erosions anemia felt to be from that. She is due for a follow-up mammogram She is currently not adequately treated for her obstructive sleep apnea is more interested in looking for a local pulmonologist sleep specialist so she doesn't have to drive so far for evaluation.  Health Maintenance  Topic Date Due  . Hepatitis C Screening  10/28/2017 (Originally 03-01-1961)  . HIV Screening  10/28/2017 (Originally 09/28/1976)  . MAMMOGRAM  08/02/2017  . TETANUS/TDAP  11/06/2017  . PAP SMEAR  10/30/2018  . COLONOSCOPY  01/31/2025  . INFLUENZA VACCINE  Addressed   Health Maintenance Review LIFESTYLE:  Exercise:  Not enough  Tobacco/ETS: noAlcohol:  no Sugar beverages: Sleep: 6-7  osa    cpap not regulated at this time.   Drug use: no HH of  4  Outside pets.  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 GI /GU: No adominal pain, vomiting, change in bowel habits. No blood in the stool. No significant GU symptoms. SKIN/HEME: ,no acute skin rashes suspicious lesions or bleeding. No lymphadenopathy, nodules, masses.  NEURO/ PSYCH:  No neurologic signs such as weakness numbness. No depression anxiety. IMM/ Allergy: No unusual infections.  Allergy .   REST of 12 system review negative except as per HPI   Past Medical History:  Diagnosis Date  .  Arthritis   . Atrophic kidney left  . History of hiatal hernia   . History of renal stone 2004  . Hyperlipidemia   . Hypothyroidism   . Insomnia   . Iron deficiency anemia   . 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 02/01/2015  . RESTLESS LEG SYNDROME, MILD 11/07/2007   Qualifier: Diagnosis of  By: Arnoldo Morale MD, Balinda Quails   . Sleep apnea    no CPAP     Past Surgical History:  Procedure Laterality Date  . COLONOSCOPY    . ESOPHAGOGASTRODUODENOSCOPY    . HIATAL HERNIA REPAIR N/A 09/05/2015   Procedure: LAPAROSCOPIC REPAIR OF HIATAL HERNIA;  Surgeon: Alphonsa Overall, MD;  Location: [facility];  Service: General;  Laterality: N/A;  . KIDNEY STONE SURGERY    . MANDIBLE FRACTURE SURGERY    . REFRACTIVE SURGERY    . TUBAL LIGATION  2001    Family History  Problem Relation Age of Onset  . Kidney failure Mother     from dm and ht  had 11 children   . Hyperlipidemia Mother   . Diabetes Mother   . Thyroid disease Mother   . Diabetes Father   . Heart attack Father     dec age 32  . Thyroid disease Sister   . Throat cancer Paternal Grandmother     dipped stuff  . Esophageal cancer Paternal Grandmother   . Colon cancer Neg Hx   . Stomach cancer Neg  Hx     Social History   Social History  . Marital status: Married    Spouse name: N/A  . Number of children: 4  . Years of education: N/A   Occupational History  . special events planner     [employer]   Social History Main Topics  . Smoking status: Never Smoker  . Smokeless tobacco: Never Used  . Alcohol use No  . Drug use: No  . Sexual activity: No   Other Topics Concern  . 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 Cholecalciferol (VITAMIN D PO) Take 2,000 Units by mouth daily.    Marland Kitchen  levothyroxine (SYNTHROID, LEVOTHROID) 112 MCG tablet TAKE ONE TABLET BY MOUTH ONCE DAILY 90 tablet 0  . pantoprazole (PROTONIX) 40 MG tablet TAKE ONE TABLET BY MOUTH ONCE DAILY 90 tablet 0   No facility-administered medications prior to visit.      EXAM:  BP 114/78 (BP Location: Left Arm, Patient Position: Sitting, Cuff Size: Normal)   Temp 97.5 F (36.4 C) (Oral)   Ht 5' 5.75" (1.67 m)   Wt 224 lb (101.6 kg)   BMI 36.43 kg/m   Body mass index is 36.43 kg/m.  Physical Exam: Vital signs reviewed ESP:QZRA is a well-developed well-nourished alert cooperative    who appearsr stated age in no acute distress.  HEENT: normocephalic atraumatic , Eyes: PERRL EOM's full, conjunctiva clear, Nares: paten,t no deformity discharge or tenderness., Ears: no deformity EAC's clear TMs with normal landmarks. Mouth: clear OP, no lesions, edema.  Moist mucous membranes. Dentition in adequate repair. NECK: supple without masses, thyroid easily felt prominence of the left versus the right side. See ultrasound from last year. or bruits. CHEST/PULM:  Clear to auscultation and percussion breath sounds equal no wheeze , rales or rhonchi. No chest wall deformities or tenderness.Breast: normal by inspection . No dimpling, discharge, masses, tenderness or discharge . CV: PMI is nondisplaced, S1 S2 no gallops, murmurs, rubs. Peripheral pulses are full without delay.No JVD .  ABDOMEN: Bowel sounds normal nontender  No guard or rebound, no hepato splenomegal no CVA tenderness.  No hernia. Extremtities:  No clubbing cyanosis or edema, no acute joint swelling or redness no focal atrophy NEURO:  Oriented x3, cranial nerves 3-12 appear to be intact, no obvious focal weakness,gait within normal limits no abnormal reflexes or asymmetrical SKIN: No acute rashes normal turgor, color, no bruising or petechiae. PSYCH: Oriented, good eye contact, no obvious depression anxiety, cognition and judgment appear normal. LN: no  cervical axillary inguinal adenopathy  Lab Results  Component Value Date   WBC 4.9 10/24/2016   HGB 14.5 10/24/2016   HCT 42.1 10/24/2016   PLT 205.0 10/24/2016   GLUCOSE 122 (H) 10/24/2016   CHOL 184 10/24/2016   TRIG 92.0 10/24/2016   HDL 55.30 10/24/2016   LDLDIRECT 122.0 02/15/2010   LDLCALC 110 (H) 10/24/2016   ALT 22 10/24/2016   AST 19 10/24/2016   NA 142 10/24/2016   K 4.5 10/24/2016   CL 106 10/24/2016   CREATININE 0.99 10/24/2016   BUN 16 10/24/2016   CO2 27 10/24/2016   TSH 1.83 10/24/2016   INR 1.02 04/18/2010   HGBA1C  5.6 10/29/2016   Wt Readings from Last 3 Encounters:  10/29/16 224 lb (101.6 kg)  12/08/15 203 lb 8 oz (92.3 kg)  11/16/15 202 lb 12.8 oz (92 kg)   BP Readings from Last 3 Encounters:  10/29/16 114/78  12/08/15 116/80  11/16/15 112/80    ASSESSMENT AND PLAN:  Discussed the following assessment and plan:  Visit for preventive health examination  Hyperglycemia - a1c stable   - Plan: POCT A1C  Medication management  OSA (obstructive sleep apnea) - Currently problems with machine and location of specialist undertreated look into intervention. Losing weight may help her   Thyroid disorder  Patient Care Team: Burnis Medin, MD as PCP - General (Internal Medicine) Gatha Mayer, MD as Consulting Physician (Gastroenterology) Patient Instructions   Take thyroid medication  1 hour pre other meds/ food    Or 2 hours after  To help best absorption.   Follow through with the sleep apnea treatment CPAP.let us know if we can help.  Fu breast imaging.  Weight is tracking up    Healthy lifestyle includes : At least 150 minutes of exercise weeks  , weight at healthy levels, which is usually   BMI 19-25. Avoid trans fats and processed foods;  Increase fresh fruits and veges to 5 servings per day. And avoid sweet beverages including tea and juice. Mediterranean diet with olive oil and nuts have been noted to be heart and brain healthy  . Avoid tobacco products . Limit  alcohol to  7 per week for women and 14 servings for men.  Get adequate sleep . Wear seat belts . Don't text and drive .   Wt Readings from Last 3 Encounters:  10/29/16 224 lb (101.6 kg)  12/08/15 203 lb 8 oz (92.3 kg)  11/16/15 202 lb 12.8 oz (92 kg)     Health Maintenance, Female Introduction Adopting a healthy lifestyle and getting preventive care can go a long way to promote health and wellness. Talk with your health care provider about what schedule of regular examinations is right for you. This is a good chance for you to check in with your provider about disease prevention and staying healthy. In between checkups, there are plenty of things you can do on your own. Experts have done a lot of research about which lifestyle changes and preventive measures are most likely to keep you healthy. Ask your health care provider for more information. Weight and diet Eat a healthy diet  Be sure to include plenty of vegetables, fruits, low-fat dairy products, and lean protein.  Do not eat a lot of foods high in solid fats, added sugars, or salt.  Get regular exercise. This is one of the most important things you can do for your health.  Most adults should exercise for at least 150 minutes each week. The exercise should increase your heart rate and make you sweat (moderate-intensity exercise).  Most adults should also do strengthening exercises at least twice a week. This is in addition to the moderate-intensity exercise. Maintain a healthy weight  Body mass index (BMI) is a measurement that can be used to identify possible weight problems. It estimates body fat based on height and weight. Your health care provider can help determine your BMI and help you achieve or maintain a healthy weight.  For females 21 years of age and older:  A BMI below 18.5 is considered underweight.  A BMI of 18.5 to 24.9 is normal.  A BMI of 25  to 29.9 is considered  overweight.  A BMI of 30 and above is considered obese. Watch levels of cholesterol and blood lipids  You should start having your blood tested for lipids and cholesterol at 56 years of age, then have this test every 5 years.  You may need to have your cholesterol levels checked more often if:  Your lipid or cholesterol levels are high.  You are older than 56 years of age.  You are at high risk for heart disease. Cancer screening Lung Cancer  Lung cancer screening is recommended for adults 64-59 years old who are at high risk for lung cancer because of a history of smoking.  A yearly low-dose CT scan of the lungs is recommended for people who:  Currently smoke.  Have quit within the past 15 years.  Have at least a 30-pack-year history of smoking. A pack year is smoking an average of one pack of cigarettes a day for 1 year.  Yearly screening should continue until it has been 15 years since you quit.  Yearly screening should stop if you develop a health problem that would prevent you from having lung cancer treatment. Breast Cancer  Practice breast self-awareness. This means understanding how your breasts normally appear and feel.  It also means doing regular breast self-exams. Let your health care provider know about any changes, no matter how small.  If you are in your 20s or 30s, you should have a clinical breast exam (CBE) by a health care provider every 1-3 years as part of a regular health exam.  If you are 80 or older, have a CBE every year. Also consider having a breast X-ray (mammogram) every year.  If you have a family history of breast cancer, talk to your health care provider about genetic screening.  If you are at high risk for breast cancer, talk to your health care provider about having an MRI and a mammogram every year.  Breast cancer gene (BRCA) assessment is recommended for women who have family members with BRCA-related cancers. BRCA-related cancers  include:  Breast.  Ovarian.  Tubal.  Peritoneal cancers.  Results of the assessment will determine the need for genetic counseling and BRCA1 and BRCA2 testing. Cervical Cancer  Your health care provider may recommend that you be screened regularly for cancer of the pelvic organs (ovaries, uterus, and vagina). This screening involves a pelvic examination, including checking for microscopic changes to the surface of your cervix (Pap test). You may be encouraged to have this screening done every 3 years, beginning at age 15.  For women ages 36-65, health care providers may recommend pelvic exams and Pap testing every 3 years, or they may recommend the Pap and pelvic exam, combined with testing for human papilloma virus (HPV), every 5 years. Some types of HPV increase your risk of cervical cancer. Testing for HPV may also be done on women of any age with unclear Pap test results.  Other health care providers may not recommend any screening for nonpregnant women who are considered low risk for pelvic cancer and who do not have symptoms. Ask your health care provider if a screening pelvic exam is right for you.  If you have had past treatment for cervical cancer or a condition that could lead to cancer, you need Pap tests and screening for cancer for at least 20 years after your treatment. If Pap tests have been discontinued, your risk factors (such as having a new sexual partner) need to be reassessed  to determine if screening should resume. Some women have medical problems that increase the chance of getting cervical cancer. In these cases, your health care provider may recommend more frequent screening and Pap tests. Colorectal Cancer  This type of cancer can be detected and often prevented.  Routine colorectal cancer screening usually begins at 56 years of age and continues through 56 years of age.  Your health care provider may recommend screening at an earlier age if you have risk factors  for colon cancer.  Your health care provider may also recommend using home test kits to check for hidden blood in the stool.  A small camera at the end of a tube can be used to examine your colon directly (sigmoidoscopy or colonoscopy). This is done to check for the earliest forms of colorectal cancer.  Routine screening usually begins at age 34.  Direct examination of the colon should be repeated every 5-10 years through 56 years of age. However, you may need to be screened more often if early forms of precancerous polyps or small growths are found. Skin Cancer  Check your skin from head to toe regularly.  Tell your health care provider about any new moles or changes in moles, especially if there is a change in a mole's shape or color.  Also tell your health care provider if you have a mole that is larger than the size of a pencil eraser.  Always use sunscreen. Apply sunscreen liberally and repeatedly throughout the day.  Protect yourself by wearing long sleeves, pants, a wide-brimmed hat, and sunglasses whenever you are outside. Heart disease, diabetes, and high blood pressure  High blood pressure causes heart disease and increases the risk of stroke. High blood pressure is more likely to develop in:  People who have blood pressure in the high end of the normal range (130-139/85-89 mm Hg).  People who are overweight or obese.  People who are African American.  If you are 28-56 years of age, have your blood pressure checked every 3-5 years. If you are 26 years of age or older, have your blood pressure checked every year. You should have your blood pressure measured twice-once when you are at a hospital or clinic, and once when you are not at a hospital or clinic. Record the average of the two measurements. To check your blood pressure when you are not at a hospital or clinic, you can use:  An automated blood pressure machine at a pharmacy.  A home blood pressure monitor.  If you  are between 23 years and 41 years old, ask your health care provider if you should take aspirin to prevent strokes.  Have regular diabetes screenings. This involves taking a blood sample to check your fasting blood sugar level.  If you are at a normal weight and have a low risk for diabetes, have this test once every three years after 56 years of age.  If you are overweight and have a high risk for diabetes, consider being tested at a younger age or more often. Preventing infection Hepatitis B  If you have a higher risk for hepatitis B, you should be screened for this virus. You are considered at high risk for hepatitis B if:  You were born in a country where hepatitis B is common. Ask your health care provider which countries are considered high risk.  Your parents were born in a high-risk country, and you have not been immunized against hepatitis B (hepatitis B vaccine).  You have  HIV or AIDS.  You use needles to inject street drugs.  You live with someone who has hepatitis B.  You have had sex with someone who has hepatitis B.  You get hemodialysis treatment.  You take certain medicines for conditions, including cancer, organ transplantation, and autoimmune conditions. Hepatitis C  Blood testing is recommended for:  Everyone born from [birth year] through 1965.  Anyone with known risk factors for hepatitis C. Sexually transmitted infections (STIs)  You should be screened for sexually transmitted infections (STIs) including gonorrhea and chlamydia if:  You are sexually active and are younger than 56 years of age.  You are older than 56 years of age and your health care provider tells you that you are at risk for this type of infection.  Your sexual activity has changed since you were last screened and you are at an increased risk for chlamydia or gonorrhea. Ask your health care provider if you are at risk.  If you do not have HIV, but are at risk, it may be recommended that you  take a prescription medicine daily to prevent HIV infection. This is called pre-exposure prophylaxis (PrEP). You are considered at risk if:  You are sexually active and do not regularly use condoms or know the HIV status of your partner(s).  You take drugs by injection.  You are sexually active with a partner who has HIV. Talk with your health care provider about whether you are at high risk of being infected with HIV. If you choose to begin PrEP, you should first be tested for HIV. You should then be tested every 3 months for as long as you are taking PrEP. Pregnancy  If you are premenopausal and you may become pregnant, ask your health care provider about preconception counseling.  If you may become pregnant, take 400 to 800 micrograms (mcg) of folic acid every day.  If you want to prevent pregnancy, talk to your health care provider about birth control (contraception). Osteoporosis and menopause  Osteoporosis is a disease in which the bones lose minerals and strength with aging. This can result in serious bone fractures. Your risk for osteoporosis can be identified using a bone density scan.  If you are 71 years of age or older, or if you are at risk for osteoporosis and fractures, ask your health care provider if you should be screened.  Ask your health care provider whether you should take a calcium or vitamin D supplement to lower your risk for osteoporosis.  Menopause may have certain physical symptoms and risks.  Hormone replacement therapy may reduce some of these symptoms and risks. Talk to your health care provider about whether hormone replacement therapy is right for you. Follow these instructions at home:  Schedule regular health, dental, and eye exams.  Stay current with your immunizations.  Do not use any tobacco products including cigarettes, chewing tobacco, or electronic cigarettes.  If you are pregnant, do not drink alcohol.  If you are breastfeeding, limit  how much and how often you drink alcohol.  Limit alcohol intake to no more than 1 drink per day for nonpregnant women. One drink equals 12 ounces of beer, 5 ounces of wine, or 1 ounces of hard liquor.  Do not use street drugs.  Do not share needles.  Ask your health care provider for help if you need support or information about quitting drugs.  Tell your health care provider if you often feel depressed.  Tell your health care provider if you  have ever been abused or do not feel safe at home. This information is not intended to replace advice given to you by your health care provider. Make sure you discuss any questions you have with your health care provider. Document Released: 04/16/2011 Document Revised: 03/08/2016 Document Reviewed: 07/05/2015  2017 [employer]vier     Mariann Laster K. Panosh M.D.

## 2016-10-29 ENCOUNTER — Ambulatory Visit (INDEPENDENT_AMBULATORY_CARE_PROVIDER_SITE_OTHER): Payer: BLUE CROSS/BLUE SHIELD | Admitting: Internal Medicine

## 2016-10-29 ENCOUNTER — Encounter: Payer: Self-pay | Admitting: Internal Medicine

## 2016-10-29 VITALS — BP 114/78 | Temp 97.5°F | Ht 65.75 in | Wt 224.0 lb

## 2016-10-29 DIAGNOSIS — E079 Disorder of thyroid, unspecified: Secondary | ICD-10-CM

## 2016-10-29 DIAGNOSIS — G4733 Obstructive sleep apnea (adult) (pediatric): Secondary | ICD-10-CM

## 2016-10-29 DIAGNOSIS — Z79899 Other long term (current) drug therapy: Secondary | ICD-10-CM | POA: Diagnosis not present

## 2016-10-29 DIAGNOSIS — Z0000 Encounter for general adult medical examination without abnormal findings: Secondary | ICD-10-CM

## 2016-10-29 DIAGNOSIS — R739 Hyperglycemia, unspecified: Secondary | ICD-10-CM | POA: Diagnosis not present

## 2016-10-29 LAB — POCT GLYCOSYLATED HEMOGLOBIN (HGB A1C): Hemoglobin A1C: 5.6

## 2016-10-29 NOTE — Patient Instructions (Addendum)
Take thyroid medication  1 hour pre other meds/ food    Or 2 hours after  To help best absorption.   Follow through with the sleep apnea treatment CPAP.let us know if we can help.  Fu breast imaging.  Weight is tracking up    Healthy lifestyle includes : At least 150 minutes of exercise weeks  , weight at healthy levels, which is usually   BMI 19-25. Avoid trans fats and processed foods;  Increase fresh fruits and veges to 5 servings per day. And avoid sweet beverages including tea and juice. Mediterranean diet with olive oil and nuts have been noted to be heart and brain healthy . Avoid tobacco products . Limit  alcohol to  7 per week for women and 14 servings for men.  Get adequate sleep . Wear seat belts . Don't text and drive .   Wt Readings from Last 3 Encounters:  10/29/16 224 lb (101.6 kg)  12/08/15 203 lb 8 oz (92.3 kg)  11/16/15 202 lb 12.8 oz (92 kg)     Health Maintenance, Female Introduction Adopting a healthy lifestyle and getting preventive care can go a long way to promote health and wellness. Talk with your health care provider about what schedule of regular examinations is right for you. This is a good chance for you to check in with your provider about disease prevention and staying healthy. In between checkups, there are plenty of things you can do on your own. Experts have done a lot of research about which lifestyle changes and preventive measures are most likely to keep you healthy. Ask your health care provider for more information. Weight and diet Eat a healthy diet  Be sure to include plenty of vegetables, fruits, low-fat dairy products, and lean protein.  Do not eat a lot of foods high in solid fats, added sugars, or salt.  Get regular exercise. This is one of the most important things you can do for your health.  Most adults should exercise for at least 150 minutes each week. The exercise should increase your heart rate and make you sweat  (moderate-intensity exercise).  Most adults should also do strengthening exercises at least twice a week. This is in addition to the moderate-intensity exercise. Maintain a healthy weight  Body mass index (BMI) is a measurement that can be used to identify possible weight problems. It estimates body fat based on height and weight. Your health care provider can help determine your BMI and help you achieve or maintain a healthy weight.  For females 76 years of age and older:  A BMI below 18.5 is considered underweight.  A BMI of 18.5 to 24.9 is normal.  A BMI of 25 to 29.9 is considered overweight.  A BMI of 30 and above is considered obese. Watch levels of cholesterol and blood lipids  You should start having your blood tested for lipids and cholesterol at 56 years of age, then have this test every 5 years.  You may need to have your cholesterol levels checked more often if:  Your lipid or cholesterol levels are high.  You are older than 56 years of age.  You are at high risk for heart disease. Cancer screening Lung Cancer  Lung cancer screening is recommended for adults 51-31 years old who are at high risk for lung cancer because of a history of smoking.  A yearly low-dose CT scan of the lungs is recommended for people who:  Currently smoke.  Have quit within  the past 15 years.  Have at least a 30-pack-year history of smoking. A pack year is smoking an average of one pack of cigarettes a day for 1 year.  Yearly screening should continue until it has been 15 years since you quit.  Yearly screening should stop if you develop a health problem that would prevent you from having lung cancer treatment. Breast Cancer  Practice breast self-awareness. This means understanding how your breasts normally appear and feel.  It also means doing regular breast self-exams. Let your health care provider know about any changes, no matter how small.  If you are in your 20s or 30s, you  should have a clinical breast exam (CBE) by a health care provider every 1-3 years as part of a regular health exam.  If you are 29 or older, have a CBE every year. Also consider having a breast X-ray (mammogram) every year.  If you have a family history of breast cancer, talk to your health care provider about genetic screening.  If you are at high risk for breast cancer, talk to your health care provider about having an MRI and a mammogram every year.  Breast cancer gene (BRCA) assessment is recommended for women who have family members with BRCA-related cancers. BRCA-related cancers include:  Breast.  Ovarian.  Tubal.  Peritoneal cancers.  Results of the assessment will determine the need for genetic counseling and BRCA1 and BRCA2 testing. Cervical Cancer  Your health care provider may recommend that you be screened regularly for cancer of the pelvic organs (ovaries, uterus, and vagina). This screening involves a pelvic examination, including checking for microscopic changes to the surface of your cervix (Pap test). You may be encouraged to have this screening done every 3 years, beginning at age 57.  For women ages 3-65, health care providers may recommend pelvic exams and Pap testing every 3 years, or they may recommend the Pap and pelvic exam, combined with testing for human papilloma virus (HPV), every 5 years. Some types of HPV increase your risk of cervical cancer. Testing for HPV may also be done on women of any age with unclear Pap test results.  Other health care providers may not recommend any screening for nonpregnant women who are considered low risk for pelvic cancer and who do not have symptoms. Ask your health care provider if a screening pelvic exam is right for you.  If you have had past treatment for cervical cancer or a condition that could lead to cancer, you need Pap tests and screening for cancer for at least 20 years after your treatment. If Pap tests have been  discontinued, your risk factors (such as having a new sexual partner) need to be reassessed to determine if screening should resume. Some women have medical problems that increase the chance of getting cervical cancer. In these cases, your health care provider may recommend more frequent screening and Pap tests. Colorectal Cancer  This type of cancer can be detected and often prevented.  Routine colorectal cancer screening usually begins at 56 years of age and continues through 56 years of age.  Your health care provider may recommend screening at an earlier age if you have risk factors for colon cancer.  Your health care provider may also recommend using home test kits to check for hidden blood in the stool.  A small camera at the end of a tube can be used to examine your colon directly (sigmoidoscopy or colonoscopy). This is done to check for the earliest  forms of colorectal cancer.  Routine screening usually begins at age 74.  Direct examination of the colon should be repeated every 5-10 years through 56 years of age. However, you may need to be screened more often if early forms of precancerous polyps or small growths are found. Skin Cancer  Check your skin from head to toe regularly.  Tell your health care provider about any new moles or changes in moles, especially if there is a change in a mole's shape or color.  Also tell your health care provider if you have a mole that is larger than the size of a pencil eraser.  Always use sunscreen. Apply sunscreen liberally and repeatedly throughout the day.  Protect yourself by wearing long sleeves, pants, a wide-brimmed hat, and sunglasses whenever you are outside. Heart disease, diabetes, and high blood pressure  High blood pressure causes heart disease and increases the risk of stroke. High blood pressure is more likely to develop in:  People who have blood pressure in the high end of the normal range (130-139/85-89 mm Hg).  People  who are overweight or obese.  People who are African American.  If you are 50-78 years of age, have your blood pressure checked every 3-5 years. If you are 36 years of age or older, have your blood pressure checked every year. You should have your blood pressure measured twice-once when you are at a hospital or clinic, and once when you are not at a hospital or clinic. Record the average of the two measurements. To check your blood pressure when you are not at a hospital or clinic, you can use:  An automated blood pressure machine at a pharmacy.  A home blood pressure monitor.  If you are between 25 years and 69 years old, ask your health care provider if you should take aspirin to prevent strokes.  Have regular diabetes screenings. This involves taking a blood sample to check your fasting blood sugar level.  If you are at a normal weight and have a low risk for diabetes, have this test once every three years after 56 years of age.  If you are overweight and have a high risk for diabetes, consider being tested at a younger age or more often. Preventing infection Hepatitis B  If you have a higher risk for hepatitis B, you should be screened for this virus. You are considered at high risk for hepatitis B if:  You were born in a country where hepatitis B is common. Ask your health care provider which countries are considered high risk.  Your parents were born in a high-risk country, and you have not been immunized against hepatitis B (hepatitis B vaccine).  You have HIV or AIDS.  You use needles to inject street drugs.  You live with someone who has hepatitis B.  You have had sex with someone who has hepatitis B.  You get hemodialysis treatment.  You take certain medicines for conditions, including cancer, organ transplantation, and autoimmune conditions. Hepatitis C  Blood testing is recommended for:  Everyone born from [birth year] through 1965.  Anyone with known risk factors for  hepatitis C. Sexually transmitted infections (STIs)  You should be screened for sexually transmitted infections (STIs) including gonorrhea and chlamydia if:  You are sexually active and are younger than 56 years of age.  You are older than 56 years of age and your health care provider tells you that you are at risk for this type of infection.  Your sexual  activity has changed since you were last screened and you are at an increased risk for chlamydia or gonorrhea. Ask your health care provider if you are at risk.  If you do not have HIV, but are at risk, it may be recommended that you take a prescription medicine daily to prevent HIV infection. This is called pre-exposure prophylaxis (PrEP). You are considered at risk if:  You are sexually active and do not regularly use condoms or know the HIV status of your partner(s).  You take drugs by injection.  You are sexually active with a partner who has HIV. Talk with your health care provider about whether you are at high risk of being infected with HIV. If you choose to begin PrEP, you should first be tested for HIV. You should then be tested every 3 months for as long as you are taking PrEP. Pregnancy  If you are premenopausal and you may become pregnant, ask your health care provider about preconception counseling.  If you may become pregnant, take 400 to 800 micrograms (mcg) of folic acid every day.  If you want to prevent pregnancy, talk to your health care provider about birth control (contraception). Osteoporosis and menopause  Osteoporosis is a disease in which the bones lose minerals and strength with aging. This can result in serious bone fractures. Your risk for osteoporosis can be identified using a bone density scan.  If you are 23 years of age or older, or if you are at risk for osteoporosis and fractures, ask your health care provider if you should be screened.  Ask your health care provider whether you should take a calcium  or vitamin D supplement to lower your risk for osteoporosis.  Menopause may have certain physical symptoms and risks.  Hormone replacement therapy may reduce some of these symptoms and risks. Talk to your health care provider about whether hormone replacement therapy is right for you. Follow these instructions at home:  Schedule regular health, dental, and eye exams.  Stay current with your immunizations.  Do not use any tobacco products including cigarettes, chewing tobacco, or electronic cigarettes.  If you are pregnant, do not drink alcohol.  If you are breastfeeding, limit how much and how often you drink alcohol.  Limit alcohol intake to no more than 1 drink per day for nonpregnant women. One drink equals 12 ounces of beer, 5 ounces of wine, or 1 ounces of hard liquor.  Do not use street drugs.  Do not share needles.  Ask your health care provider for help if you need support or information about quitting drugs.  Tell your health care provider if you often feel depressed.  Tell your health care provider if you have ever been abused or do not feel safe at home. This information is not intended to replace advice given to you by your health care provider. Make sure you discuss any questions you have with your health care provider. Document Released: 04/16/2011 Document Revised: 03/08/2016 Document Reviewed: 07/05/2015  2017 Elsevier

## 2016-12-19 ENCOUNTER — Other Ambulatory Visit: Payer: Self-pay | Admitting: Internal Medicine

## 2016-12-19 NOTE — Telephone Encounter (Signed)
Pt has been sch for sept 2018

## 2016-12-19 NOTE — Telephone Encounter (Signed)
Sent a 6 month supply pt needs appointment for f/u for further refills

## 2017-01-04 ENCOUNTER — Encounter: Payer: Self-pay | Admitting: Family Medicine

## 2017-01-04 ENCOUNTER — Ambulatory Visit (INDEPENDENT_AMBULATORY_CARE_PROVIDER_SITE_OTHER): Payer: BLUE CROSS/BLUE SHIELD | Admitting: Family Medicine

## 2017-01-04 VITALS — BP 118/86 | HR 74 | Temp 98.3°F | Wt 229.0 lb

## 2017-01-04 DIAGNOSIS — J209 Acute bronchitis, unspecified: Secondary | ICD-10-CM | POA: Diagnosis not present

## 2017-01-04 MED ORDER — AZITHROMYCIN 250 MG PO TABS: ORAL_TABLET | ORAL | 0 refills | Status: DC

## 2017-01-04 NOTE — Patient Instructions (Signed)
WE NOW OFFER   [facility] FAST TRACK!!!  SAME DAY Appointments for ACUTE CARE  Such as: Sprains, Injuries, cuts, abrasions, rashes, muscle pain, joint pain, back pain Colds, flu, sore throats, headache, allergies, cough, fever  Ear pain, sinus and eye infections Abdominal pain, nausea, vomiting, diarrhea, upset stomach Animal/insect bites  3 Easy Ways to Schedule: Walk-In Scheduling Call in scheduling Mychart Sign-up: [email or web address]         

## 2017-01-04 NOTE — Progress Notes (Signed)
Pre visit review using our clinic review tool, if applicable. No additional management support is needed unless otherwise documented below in the visit note. 

## 2017-01-04 NOTE — Progress Notes (Signed)
   Subjective:    Patient ID: Angelica Gray, female    DOB: [birth year], [age] y.o.   MRN: [id number]  HPI Here for 2 weeks of PND, chest congestion, and a dry cough. No fever. Taking Robitussin DM.    Review of Systems  Constitutional: Negative.   HENT: Positive for postnasal drip. Negative for sinus pain, sinus pressure and sore throat.   Eyes: Negative.   Respiratory: Positive for cough.        Objective:   Physical Exam  Constitutional: She appears well-developed and well-nourished.  HENT:  Right Ear: External ear normal.  Left Ear: External ear normal.  Nose: Nose normal.  Mouth/Throat: Oropharynx is clear and moist.  Eyes: Conjunctivae are normal.  Neck: No thyromegaly present.  Pulmonary/Chest: Effort normal. No respiratory distress. She has no wheezes. She has no rales.  Scattered rhonchi   Lymphadenopathy:    She has no cervical adenopathy.          Assessment & Plan:  Bronchitis, treat with a Zpack. Drink fluids.  Gershon CraneStephen Fry, MD

## 2017-03-22 ENCOUNTER — Other Ambulatory Visit: Payer: Self-pay | Admitting: Internal Medicine

## 2017-06-12 ENCOUNTER — Other Ambulatory Visit: Payer: Self-pay | Admitting: Internal Medicine

## 2017-07-04 NOTE — Progress Notes (Signed)
Chief Complaint  Patient presents with  . Follow-up    A1c    HPI: Angelica Gray 56 y.o. come in for Chronic disease management  Thyroid  Same med   osa has not been using her CPAP machine for a while for comfort reasons but did not get back with specialist. Review of the chart shows that Dr. Sherene Sires saw her in consult with Dr. Maple Hudson who did the sleep study interpretation.  Weight  About the same hard time losing  Acid blocker   Still taking . Per Dr. Leone Payor had surgery2-1/2 years ago uncertain how long to stay on it last refill was done per Dr. Marvell Fuller office she has an occasional breakthrough heartburn she is known to have a hiatal hernia and a history of Nissen.  Right arm numbness comes and goes . And   No weakness    Some with excess caffine .  Elbow discomfort. He is right handed no specific trauma may be worse in the morning no pain. Think since the middle fingers. ROS: See pertinent positives and negatives per HPI.does have some restless leg symptoms.   Past Medical History:  Diagnosis Date  . Arthritis   . Atrophic kidney left  . History of hiatal hernia   . History of renal stone 2004  . Hyperlipidemia   . Hypothyroidism   . Insomnia   . Iron deficiency anemia   . 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 02/01/2015  . RESTLESS LEG SYNDROME, MILD 11/07/2007   Qualifier: Diagnosis of  By: Lovell Sheehan MD, Balinda Quails   . Sleep apnea    no CPAP     Family History  Problem Relation Age of Onset  . Kidney failure Mother        from dm and ht  had 11 children   . Hyperlipidemia Mother   . Diabetes Mother   . Thyroid disease Mother   . Diabetes Father   . Heart attack Father        dec age 69  . Thyroid disease Sister   . Throat cancer Paternal Grandmother        dipped stuff  . Esophageal cancer Paternal Grandmother   . Colon cancer Neg Hx   . Stomach cancer Neg Hx     Social History   Social History  . Marital status: Married   Spouse name: N/A  . Number of children: 4  . Years of education: N/A   Occupational History  . special events planner     [employer]   Social History Main Topics  . Smoking status: Never Smoker  . Smokeless tobacco: Never Used  . Alcohol use No  . Drug use: No  . Sexual activity: No   Other Topics Concern  . 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 levothyroxine (SYNTHROID, LEVOTHROID) 112 MCG tablet TAKE 1 TABLET BY MOUTH ONCE DAILY 90 tablet 1  . pantoprazole (PROTONIX) 40 MG tablet TAKE 1 TABLET BY MOUTH ONCE DAILY 90 tablet 0  . azithromycin (ZITHROMAX Z-PAK) 250 MG tablet As directed (Patient not taking: Reported on 07/05/2017) 6 each 0  . Cholecalciferol (VITAMIN D PO) Take 2,000  Units by mouth daily.     No facility-administered medications prior to visit.      EXAM:  BP 102/80 (BP Location: Right Arm, Patient Position: Sitting, Cuff Size: Normal)   Pulse 72   Temp 98.2 F (36.8 C) (Oral)   Wt 230 lb 12.8 oz (104.7 kg)   BMI 37.54 kg/m   Body mass index is 37.54 kg/m.  GENERAL: vitals reviewed and listed above, alert, oriented, appears well hydrated and in no acute distress HEENT: atraumatic, conjunctiva  clear, no obvious abnormalities on inspection of external nose and ears OP : no lesion edema or exudate  NECK: no obvious masses on inspection palpation thyroid easily palpable I don't feel a nodule LUNGS: clear to auscultation bilaterally, no wheezes, rales or rhonchi, good air movement CV: HRRR, no clubbing cyanosis or  peripheral edema nl cap refill  MS: moves all extremities without noticeable focal  Abnormality  t here are a few what appeared to be Heberden's nodules no hand deformity pulses intact and no obvious atrophy.However grip strength  slightly better on left than right no weakness. No fasciculations. PSYCH: pleasant and cooperative, no obvious depression or anxiety Lab Results  Component Value Date   WBC 4.9 10/24/2016   HGB 14.5 10/24/2016   HCT 42.1 10/24/2016   PLT 205.0 10/24/2016   GLUCOSE 122 (H) 10/24/2016   CHOL 184 10/24/2016   TRIG 92.0 10/24/2016   HDL 55.30 10/24/2016   LDLDIRECT 122.0 02/15/2010   LDLCALC 110 (H) 10/24/2016   ALT 22 10/24/2016   AST 19 10/24/2016   NA 142 10/24/2016   K 4.5 10/24/2016   CL 106 10/24/2016   CREATININE 0.99 10/24/2016   BUN 16 10/24/2016   CO2 27 10/24/2016   TSH 1.83 10/24/2016   INR 1.02 04/18/2010   HGBA1C 5.5 07/05/2017   BP Readings from Last 3 Encounters:  07/05/17 102/80  01/04/17 118/86  10/29/16 114/78   Wt Readings from Last 3 Encounters:  07/05/17 230 lb 12.8 oz (104.7 kg)  01/04/17 229 lb (103.9 kg)  10/29/16 224 lb (101.6 kg)    ASSESSMENT AND PLAN:  Discussed the following assessment and plan:  Hyperglycemia - stabel no diabetes  - Plan: POC HgB A1c  Numbness and tingling of right arm  OSA (obstructive sleep apnea) - not using cpap issues  disc getting back eith pulmonary ? is she aacandidate for mouth appliance other intervnetion  Medication management - continue   protonix per dr Leone Payor  - Plan: POC HgB A1c  Thyroid disorder - exam stable  RLS (restless legs syndrome) Overall she may do best getting back with a sleep specialist in regard to her sleep apnea and restless leg symptoms. Apparently the previous medicine given first restless leg made her sedated for a long time. Blood sugar is good other parameters plan CPX in 6 months. Numbness sounds compression like situational positional monitor for alarm findings progression weakness etc. Further evaluation if appropriate.  No alarm findings .  -Patient advised to return or notify health care team  if  new concerns arise.  Patient Instructions  If get weakness   In arm further  evaluation .   Stay on    protonix   And ask Leone Payor for refills .  Look at positional  For arm numbness and if persistent and weak .   Get will sleep medicine ( pulmonary   Dr young is the one who read your sleep study ) . About  osa rx .  And rls management  Uncertain if  You are a candidate for mouth appliance .    Plan rov in check up in 6 months or as needed . Get  Your flu vaccine                 Neta Mends. Shaneka Efaw M.D.  IMPRESSION: 1. Diffusely enlarged and heterogeneous thyroid gland. 2. Incidental note is made of a small 9 mm hypoechoic nodule versus pseudo nodule at the superior aspect of the isthmus in the midline.  Findings do not meet current SRU consensus criteria for biopsy. Follow-up by clinical exam is recommended. If patient has known risk factors for thyroid carcinoma, consider follow-up ultrasound in 12 months. If patient is clinically hyperthyroid, consider nuclear medicine thyroid uptake and scan.  Reference: Management of Thyroid Nodules Detected at [place]: Society of Radiologists in Ultrasound Consensus Conference Statement. Radiology 2005; [id number].   Electronically Signed   By: Malachy Moan M.D.   On: 11/18/2015 17:09

## 2017-07-05 ENCOUNTER — Ambulatory Visit (INDEPENDENT_AMBULATORY_CARE_PROVIDER_SITE_OTHER): Payer: BLUE CROSS/BLUE SHIELD | Admitting: Internal Medicine

## 2017-07-05 ENCOUNTER — Encounter: Payer: Self-pay | Admitting: Internal Medicine

## 2017-07-05 VITALS — BP 102/80 | HR 72 | Temp 98.2°F | Wt 230.8 lb

## 2017-07-05 DIAGNOSIS — Z79899 Other long term (current) drug therapy: Secondary | ICD-10-CM

## 2017-07-05 DIAGNOSIS — G4733 Obstructive sleep apnea (adult) (pediatric): Secondary | ICD-10-CM | POA: Diagnosis not present

## 2017-07-05 DIAGNOSIS — R202 Paresthesia of skin: Secondary | ICD-10-CM

## 2017-07-05 DIAGNOSIS — R200 Anesthesia of skin: Secondary | ICD-10-CM | POA: Diagnosis not present

## 2017-07-05 DIAGNOSIS — G2581 Restless legs syndrome: Secondary | ICD-10-CM

## 2017-07-05 DIAGNOSIS — R739 Hyperglycemia, unspecified: Secondary | ICD-10-CM

## 2017-07-05 DIAGNOSIS — E079 Disorder of thyroid, unspecified: Secondary | ICD-10-CM | POA: Diagnosis not present

## 2017-07-05 LAB — POCT GLYCOSYLATED HEMOGLOBIN (HGB A1C): Hemoglobin A1C: 5.5

## 2017-07-05 NOTE — Patient Instructions (Addendum)
If get weakness   In arm further evaluation .   Stay on    protonix   And ask Leone Payor for refills .  Look at positional  For arm numbness and if persistent and weak .   Get will sleep medicine ( pulmonary   Dr young is the one who read your sleep study ) . About  osa rx .  And rls management  Uncertain if  You are a candidate for mouth appliance .    Plan rov in check up in 6 months or as needed . Get  Your flu vaccine

## 2017-08-24 ENCOUNTER — Other Ambulatory Visit: Payer: Self-pay | Admitting: Internal Medicine

## 2017-09-25 ENCOUNTER — Other Ambulatory Visit: Payer: Self-pay | Admitting: Internal Medicine

## 2017-11-01 NOTE — Progress Notes (Signed)
Chief Complaint  Patient presents with  . Annual Exam    pap today. C/o joint pains in right hip.     HPI: Patient  Angelica Gray  57 y.o. comes in today for Preventive Health Care visit  meds and  PAP gyne exam   lmp age 36 or abouts   Is the problem solver in the family but no deprssion anxiety  Thyroid   Med qd .  roght l;ater hip  Pain at times  Hard to get up from sitting but better as day goes on  No assoc sx injury  Has osa cpap needs adjustment    Health Maintenance  Topic Date Due  . Hepatitis C Screening  03/[birth year]/61  . HIV Screening  09/28/1976  . MAMMOGRAM  08/02/2017  . INFLUENZA VACCINE  01/12/2018 (Originally 05/15/2017)  . TETANUS/TDAP  11/06/2017  . PAP SMEAR  10/30/2018  . COLONOSCOPY  01/31/2025   Health Maintenance Review LIFESTYLE:  Exercise:   No reg .  Tobacco/ETS:  no Alcohol:  No  Sugar beverages: Sleep:  5 hours  Drug use: no  HH of  [place] to family farm house   5+  Out side pets   Work: 75 +   ROS:   See hpi  rls buring back of legs at times  GEN/ HEENT: No fever, significant weight changes sweats headaches vision problems hearing changes, CV/ PULM; No chest pain shortness of breath cough, syncope,edema  change in exercise tolerance. GI /GU: No adominal pain, vomiting, change in bowel habits. No blood in the stool. No significant GU symptoms. SKIN/HEME: ,no acute skin rashes suspicious lesions or bleeding. No lymphadenopathy, nodules, masses.  NEURO/ PSYCH:  No neurologic signs such as weakness numbness. No depression anxiety. IMM/ Allergy: No unusual infections.  Allergy .   REST of 12 system review negative except as per HPI   Past Medical History:  Diagnosis Date  . Arthritis   . Atrophic kidney left  . History of hiatal hernia   . History of renal stone 2004  . Hyperlipidemia   . Hypothyroidism   . Insomnia   . Iron deficiency anemia   . 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 02/01/2015  . RESTLESS  LEG SYNDROME, MILD 11/07/2007   Qualifier: Diagnosis of  By: Arnoldo Morale MD, Balinda Quails   . Sleep apnea    no CPAP     Past Surgical History:  Procedure Laterality Date  . COLONOSCOPY    . ESOPHAGOGASTRODUODENOSCOPY    . HIATAL HERNIA REPAIR N/A 09/05/2015   Procedure: LAPAROSCOPIC REPAIR OF HIATAL HERNIA;  Surgeon: Alphonsa Overall, MD;  Location: [facility];  Service: General;  Laterality: N/A;  . KIDNEY STONE SURGERY    . MANDIBLE FRACTURE SURGERY    . REFRACTIVE SURGERY    . TUBAL LIGATION  2001    Family History  Problem Relation Age of Onset  . Kidney failure Mother        from dm and ht  had 11 children   . Hyperlipidemia Mother   . Diabetes Mother   . Thyroid disease Mother   . Diabetes Father   . Heart attack Father        dec age 102  . Thyroid disease Sister   . Throat cancer Paternal Grandmother        dipped stuff  . Esophageal cancer Paternal Grandmother   . Colon cancer Neg Hx   . Stomach cancer Neg Hx  Social History   Socioeconomic History  . Marital status: Married    Spouse name: None  . Number of children: 4  . Years of education: None  . Highest education level: None  Social Needs  . Financial resource strain: None  . Food insecurity - worry: None  . Food insecurity - inability: None  . Transportation needs - medical: None  . Transportation needs - non-medical: None  Occupational History  . Occupation: special events planner    Comment: Financial trader  Tobacco Use  . Smoking status: Never Smoker  . Smokeless tobacco: Never Used  Substance and Sexual Activity  . Alcohol use: No    Alcohol/week: 0.0 oz  . Drug use: No  . Sexual activity: No  Other Topics Concern  . 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 Cholecalciferol (EQL VITAMIN D3) 2000  units CAPS Take 2,000 Units by mouth daily.    Marland Kitchen levothyroxine (SYNTHROID, LEVOTHROID) 112 MCG tablet TAKE 1 TABLET BY MOUTH ONCE DAILY 90 tablet 1  . pantoprazole (PROTONIX) 40 MG tablet TAKE 1 TABLET BY MOUTH ONCE DAILY 90 tablet 0   No facility-administered medications prior to visit.      EXAM:  BP 118/76 (BP Location: Left Arm, Patient Position: Sitting, Cuff Size: Normal)   Pulse 71   Temp 98 F (36.7 C) (Oral)   Ht 5' 5.25" (1.657 m)   Wt 231 lb (104.8 kg)   BMI 38.15 kg/m   Body mass index is 38.15 kg/m. Wt Readings from Last 3 Encounters:  11/04/17 231 lb (104.8 kg)  07/05/17 230 lb 12.8 oz (104.7 kg)  01/04/17 229 lb (103.9 kg)    Physical Exam: Vital signs reviewed WGY:KZLD is a well-developed well-nourished alert cooperative    who appearsr stated age in no acute distress.  HEENT: normocephalic atraumatic , Eyes: PERRL EOM's full, conjunctiva clear, Nares: paten,t no deformity discharge or tenderness., Ears: no deformity EAC's clear TMs with normal landmarks. Mouth: clear OP, no lesions, edema.  Moist mucous membranes. Dentition in adequate repair. NECK: supple without  Bruits. Thyroid easly palpable left more than right  CHEST/PULM:  Clear to auscultation and percussion breath sounds equal no wheeze , rales or rhonchi. No chest wall deformities or tenderness. Breast: normal by inspection . No dimpling, discharge, masses, tenderness or discharge . CV: PMI is nondisplaced, S1 S2 no gallops, murmurs, rubs. Peripheral pulses are full without delay.No JVD .  ABDOMEN: Bowel sounds normal nontender  No guard or rebound, no hepato splenomegal no CVA tenderness.   Extremtities:  No clubbing cyanosis or edema, no acute joint swelling or redness no focal atrophy NEURO:  Oriented x3, cranial nerves 3-12 appear to be intact, no obvious focal weakness,gait within normal limits no abnormal reflexes or asymmetrical SKIN: No acute rashes normal turgor, color, no bruising or  petechiae. PSYCH: Oriented, good eye contact, no obvious depression anxiety, cognition and judgment appear normal. LN: no cervical axillary inguinal adenopathy Pelvic: NL ext GU, labia clear without lesions or rash . Vagina no lesions .Cervix: clear  UTERUS: Neg CMT Adnexa:  clear no masses . PAP done with HR hpv cotesting   Lab Results  Component Value Date  WBC 4.9 10/24/2016   HGB 14.5 10/24/2016   HCT 42.1 10/24/2016   PLT 205.0 10/24/2016   GLUCOSE 122 (H) 10/24/2016   CHOL 184 10/24/2016   TRIG 92.0 10/24/2016   HDL 55.30 10/24/2016   LDLDIRECT 122.0 02/15/2010   LDLCALC 110 (H) 10/24/2016   ALT 22 10/24/2016   AST 19 10/24/2016   NA 142 10/24/2016   K 4.5 10/24/2016   CL 106 10/24/2016   CREATININE 0.99 10/24/2016   BUN 16 10/24/2016   CO2 27 10/24/2016   TSH 1.83 10/24/2016   INR 1.02 04/18/2010   HGBA1C 5.5 07/05/2017    BP Readings from Last 3 Encounters:  11/04/17 118/76  07/05/17 102/80  01/04/17 118/86     ASSESSMENT AND PLAN:  Discussed the following assessment and plan:  Visit for preventive health examination - Plan: Basic metabolic panel, CBC with Differential/Platelet, Hemoglobin A1c, Hepatic function panel, Lipid panel, TSH, Hepatitis C antibody, Cytology - PAP  Medication management - Plan: Basic metabolic panel, CBC with Differential/Platelet, Hemoglobin A1c, Hepatic function panel, Lipid panel, TSH, Hepatitis C antibody  Hyperglycemia - Plan: Basic metabolic panel, CBC with Differential/Platelet, Hemoglobin A1c, Hepatic function panel, Lipid panel, TSH, Hepatitis C antibody  Encounter for gynecological examination without abnormal finding - Plan: Basic metabolic panel, CBC with Differential/Platelet, Hemoglobin A1c, Hepatic function panel, Lipid panel, TSH, Hepatitis C antibody, Cytology - PAP  Thyroid disorder - Plan: Basic metabolic panel, CBC with Differential/Platelet, Hemoglobin A1c, Hepatic function panel, Lipid panel, TSH, Hepatitis C  antibody  Need for hepatitis C screening test - Plan: Hepatitis C antibody  OSA (obstructive sleep apnea)  Atrophic kidney  RLS (restless legs syndrome) r hip pains seems very local and mechanical  Poss bursitis but sx off and on    Expectant management. And   Fu if progressive sx or other  Lab today  Patient Care Team: Panosh, Standley Brooking, MD as PCP - General (Internal Medicine) Gatha Mayer, MD as Consulting Physician (Gastroenterology) Patient Instructions  Will notify you  of labs/pap when available. Avoid sugar beverages . If all ok then yearly check up  cpx  And labs    Preventive Care 40-64 Years, Female Preventive care refers to lifestyle choices and visits with your health care provider that can promote health and wellness. What does preventive care include?  A yearly physical exam. This is also called an annual well check.  Dental exams once or twice a year.  Routine eye exams. Ask your health care provider how often you should have your eyes checked.  Personal lifestyle choices, including: ? Daily care of your teeth and gums. ? Regular physical activity. ? Eating a healthy diet. ? Avoiding tobacco and drug use. ? Limiting alcohol use. ? Practicing safe sex. ? Taking low-dose aspirin daily starting at age 57. ? Taking vitamin and mineral supplements as recommended by your health care provider. What happens during an annual well check? The services and screenings done by your health care provider during your annual well check will depend on your age, overall health, lifestyle risk factors, and family history of disease. Counseling Your health care provider may ask you questions about your:  Alcohol use.  Tobacco use.  Drug use.  Emotional well-being.  Home and relationship well-being.  Sexual activity.  Eating habits.  Work and work Statistician.  Method of birth control.  Menstrual cycle.  Pregnancy history.  Screening You may have the  following tests or measurements:  Height, weight, and BMI.  Blood pressure.  Lipid and cholesterol levels. These may be checked every 5 years, or more frequently if you are over 16 years old.  Skin check.  Lung cancer screening. You may have this screening every year starting at age 74 if you have a 30-pack-year history of smoking and currently smoke or have quit within the past 15 years.  Fecal occult blood test (FOBT) of the stool. You may have this test every year starting at age 17.  Flexible sigmoidoscopy or colonoscopy. You may have a sigmoidoscopy every 5 years or a colonoscopy every 10 years starting at age 48.  Hepatitis C blood test.  Hepatitis B blood test.  Sexually transmitted disease (STD) testing.  Diabetes screening. This is done by checking your blood sugar (glucose) after you have not eaten for a while (fasting). You may have this done every 1-3 years.  Mammogram. This may be done every 1-2 years. Talk to your health care provider about when you should start having regular mammograms. This may depend on whether you have a family history of breast cancer.  BRCA-related cancer screening. This may be done if you have a family history of breast, ovarian, tubal, or peritoneal cancers.  Pelvic exam and Pap test. This may be done every 3 years starting at age 38. Starting at age 21, this may be done every 5 years if you have a Pap test in combination with an HPV test.  Bone density scan. This is done to screen for osteoporosis. You may have this scan if you are at high risk for osteoporosis.  Discuss your test results, treatment options, and if necessary, the need for more tests with your health care provider. Vaccines Your health care provider may recommend certain vaccines, such as:  Influenza vaccine. This is recommended every year.  Tetanus, diphtheria, and acellular pertussis (Tdap, Td) vaccine. You may need a Td booster every 10 years.  Varicella vaccine. You  may need this if you have not been vaccinated.  Zoster vaccine. You may need this after age 27.  Measles, mumps, and rubella (MMR) vaccine. You may need at least one dose of MMR if you were born in 1957 or later. You may also need a second dose.  Pneumococcal 13-valent conjugate (PCV13) vaccine. You may need this if you have certain conditions and were not previously vaccinated.  Pneumococcal polysaccharide (PPSV23) vaccine. You may need one or two doses if you smoke cigarettes or if you have certain conditions.  Meningococcal vaccine. You may need this if you have certain conditions.  Hepatitis A vaccine. You may need this if you have certain conditions or if you travel or work in places where you may be exposed to hepatitis A.  Hepatitis B vaccine. You may need this if you have certain conditions or if you travel or work in places where you may be exposed to hepatitis B.  Haemophilus influenzae type b (Hib) vaccine. You may need this if you have certain conditions.  Talk to your health care provider about which screenings and vaccines you need and how often you need them. This information is not intended to replace advice given to you by your health care provider. Make sure you discuss any questions you have with your health care provider. Document Released: 10/28/2015 Document Revised: 06/20/2016 Document Reviewed: 08/02/2015 Elsevier Interactive Patient Education  2018 [place]. Panosh M.D.

## 2017-11-04 ENCOUNTER — Other Ambulatory Visit (HOSPITAL_COMMUNITY)
Admission: RE | Admit: 2017-11-04 | Discharge: 2017-11-04 | Disposition: A | Discharge status: Home / Self Care | Payer: BLUE CROSS/BLUE SHIELD | Source: Ambulatory Visit | Attending: Internal Medicine | Admitting: Internal Medicine

## 2017-11-04 ENCOUNTER — Encounter: Payer: Self-pay | Admitting: Internal Medicine

## 2017-11-04 ENCOUNTER — Ambulatory Visit (INDEPENDENT_AMBULATORY_CARE_PROVIDER_SITE_OTHER): Payer: BLUE CROSS/BLUE SHIELD | Admitting: Internal Medicine

## 2017-11-04 VITALS — BP 118/76 | HR 71 | Temp 98.0°F | Ht 65.25 in | Wt 231.0 lb

## 2017-11-04 DIAGNOSIS — R739 Hyperglycemia, unspecified: Secondary | ICD-10-CM

## 2017-11-04 DIAGNOSIS — N261 Atrophy of kidney (terminal): Secondary | ICD-10-CM

## 2017-11-04 DIAGNOSIS — Z0000 Encounter for general adult medical examination without abnormal findings: Secondary | ICD-10-CM

## 2017-11-04 DIAGNOSIS — Z1159 Encounter for screening for other viral diseases: Secondary | ICD-10-CM | POA: Diagnosis not present

## 2017-11-04 DIAGNOSIS — Z79899 Other long term (current) drug therapy: Secondary | ICD-10-CM | POA: Diagnosis not present

## 2017-11-04 DIAGNOSIS — G2581 Restless legs syndrome: Secondary | ICD-10-CM

## 2017-11-04 DIAGNOSIS — Z01419 Encounter for gynecological examination (general) (routine) without abnormal findings: Secondary | ICD-10-CM | POA: Insufficient documentation

## 2017-11-04 DIAGNOSIS — G4733 Obstructive sleep apnea (adult) (pediatric): Secondary | ICD-10-CM

## 2017-11-04 DIAGNOSIS — E079 Disorder of thyroid, unspecified: Secondary | ICD-10-CM

## 2017-11-04 LAB — BASIC METABOLIC PANEL
BUN: 17 mg/dL (ref 6–23)
CHLORIDE: 104 meq/L (ref 96–112)
CO2: 28 meq/L (ref 19–32)
Calcium: 9.3 mg/dL (ref 8.4–10.5)
Creatinine, Ser: 0.92 mg/dL (ref 0.40–1.20)
GFR: 67.09 mL/min (ref >60.00)
Glucose, Bld: 104 mg/dL — ABNORMAL HIGH (ref 70–99)
POTASSIUM: 4.4 meq/L (ref 3.5–5.1)
Sodium: 138 mEq/L (ref 135–145)

## 2017-11-04 LAB — CBC WITH DIFFERENTIAL/PLATELET
BASOS PCT: 0.6 % (ref 0.0–3.0)
Basophils Absolute: 0 10*3/uL (ref 0.0–0.1)
EOS PCT: 2.8 % (ref 0.0–5.0)
Eosinophils Absolute: 0.1 10*3/uL (ref 0.0–0.7)
HEMATOCRIT: 42.4 % (ref 36.0–46.0)
HEMOGLOBIN: 14.3 g/dL (ref 12.0–15.0)
LYMPHS PCT: 25.9 % (ref 12.0–46.0)
Lymphs Abs: 1.3 10*3/uL (ref 0.7–4.0)
MCHC: 33.7 g/dL (ref 30.0–36.0)
MCV: 87.8 fl (ref 78.0–100.0)
Monocytes Absolute: 0.4 10*3/uL (ref 0.1–1.0)
Monocytes Relative: 6.9 % (ref 3.0–12.0)
NEUTROS ABS: 3.3 10*3/uL (ref 1.4–7.7)
Neutrophils Relative %: 63.8 % (ref 43.0–77.0)
PLATELETS: 204 10*3/uL (ref 150.0–400.0)
RBC: 4.83 Mil/uL (ref 3.87–5.11)
RDW: 13 % (ref 11.5–15.5)
WBC: 5.1 10*3/uL (ref 4.0–10.5)

## 2017-11-04 LAB — LIPID PANEL
CHOLESTEROL: 157 mg/dL (ref 0–200)
HDL: 44.8 mg/dL (ref >39.00)
LDL Cholesterol: 93 mg/dL (ref 0–99)
NonHDL: 112.15
Total CHOL/HDL Ratio: 4
Triglycerides: 94 mg/dL (ref 0.0–149.0)
VLDL: 18.8 mg/dL (ref 0.0–40.0)

## 2017-11-04 LAB — HEPATIC FUNCTION PANEL
ALBUMIN: 4.3 g/dL (ref 3.5–5.2)
ALT: 15 U/L (ref 0–35)
AST: 16 U/L (ref 0–37)
Alkaline Phosphatase: 107 U/L (ref 39–117)
Bilirubin, Direct: 0.1 mg/dL (ref 0.0–0.3)
TOTAL PROTEIN: 6.9 g/dL (ref 6.0–8.3)
Total Bilirubin: 0.7 mg/dL (ref 0.2–1.2)

## 2017-11-04 LAB — TSH: TSH: 1.52 u[IU]/mL (ref 0.35–4.50)

## 2017-11-04 LAB — HEMOGLOBIN A1C: Hgb A1c MFr Bld: 6.2 % (ref 4.6–6.5)

## 2017-11-04 NOTE — Patient Instructions (Signed)
Will notify you  of labs/pap when available. Avoid sugar beverages . If all ok then yearly check up  cpx  And labs    Preventive Care 40-64 Years, Female Preventive care refers to lifestyle choices and visits with your health care provider that can promote health and wellness. What does preventive care include?  A yearly physical exam. This is also called an annual well check.  Dental exams once or twice a year.  Routine eye exams. Ask your health care provider how often you should have your eyes checked.  Personal lifestyle choices, including: ? Daily care of your teeth and gums. ? Regular physical activity. ? Eating a healthy diet. ? Avoiding tobacco and drug use. ? Limiting alcohol use. ? Practicing safe sex. ? Taking low-dose aspirin daily starting at age 31. ? Taking vitamin and mineral supplements as recommended by your health care provider. What happens during an annual well check? The services and screenings done by your health care provider during your annual well check will depend on your age, overall health, lifestyle risk factors, and family history of disease. Counseling Your health care provider may ask you questions about your:  Alcohol use.  Tobacco use.  Drug use.  Emotional well-being.  Home and relationship well-being.  Sexual activity.  Eating habits.  Work and work Statistician.  Method of birth control.  Menstrual cycle.  Pregnancy history.  Screening You may have the following tests or measurements:  Height, weight, and BMI.  Blood pressure.  Lipid and cholesterol levels. These may be checked every 5 years, or more frequently if you are over 42 years old.  Skin check.  Lung cancer screening. You may have this screening every year starting at age 29 if you have a 30-pack-year history of smoking and currently smoke or have quit within the past 15 years.  Fecal occult blood test (FOBT) of the stool. You may have this test every year  starting at age 9.  Flexible sigmoidoscopy or colonoscopy. You may have a sigmoidoscopy every 5 years or a colonoscopy every 10 years starting at age 46.  Hepatitis C blood test.  Hepatitis B blood test.  Sexually transmitted disease (STD) testing.  Diabetes screening. This is done by checking your blood sugar (glucose) after you have not eaten for a while (fasting). You may have this done every 1-3 years.  Mammogram. This may be done every 1-2 years. Talk to your health care provider about when you should start having regular mammograms. This may depend on whether you have a family history of breast cancer.  BRCA-related cancer screening. This may be done if you have a family history of breast, ovarian, tubal, or peritoneal cancers.  Pelvic exam and Pap test. This may be done every 3 years starting at age 18. Starting at age 62, this may be done every 5 years if you have a Pap test in combination with an HPV test.  Bone density scan. This is done to screen for osteoporosis. You may have this scan if you are at high risk for osteoporosis.  Discuss your test results, treatment options, and if necessary, the need for more tests with your health care provider. Vaccines Your health care provider may recommend certain vaccines, such as:  Influenza vaccine. This is recommended every year.  Tetanus, diphtheria, and acellular pertussis (Tdap, Td) vaccine. You may need a Td booster every 10 years.  Varicella vaccine. You may need this if you have not been vaccinated.  Zoster vaccine. You  may need this after age 100.  Measles, mumps, and rubella (MMR) vaccine. You may need at least one dose of MMR if you were born in 1957 or later. You may also need a second dose.  Pneumococcal 13-valent conjugate (PCV13) vaccine. You may need this if you have certain conditions and were not previously vaccinated.  Pneumococcal polysaccharide (PPSV23) vaccine. You may need one or two doses if you smoke  cigarettes or if you have certain conditions.  Meningococcal vaccine. You may need this if you have certain conditions.  Hepatitis A vaccine. You may need this if you have certain conditions or if you travel or work in places where you may be exposed to hepatitis A.  Hepatitis B vaccine. You may need this if you have certain conditions or if you travel or work in places where you may be exposed to hepatitis B.  Haemophilus influenzae type b (Hib) vaccine. You may need this if you have certain conditions.  Talk to your health care provider about which screenings and vaccines you need and how often you need them. This information is not intended to replace advice given to you by your health care provider. Make sure you discuss any questions you have with your health care provider. Document Released: 10/28/2015 Document Revised: 06/20/2016 Document Reviewed: 08/02/2015 Elsevier Interactive Patient Education  [employer].

## 2017-11-05 LAB — CYTOLOGY - PAP
DIAGNOSIS: NEGATIVE
HPV: NOT DETECTED

## 2017-11-05 LAB — HEPATITIS C ANTIBODY
HEP C AB: NONREACTIVE
SIGNAL TO CUT-OFF: 0.06 (ref <1.00)

## 2017-11-06 NOTE — Progress Notes (Signed)
Tell patient PAP is normal. HPV high  risk is negative can do   4-5 year for next pap

## 2017-11-24 ENCOUNTER — Other Ambulatory Visit: Payer: Self-pay | Admitting: Internal Medicine

## 2018-05-26 ENCOUNTER — Other Ambulatory Visit: Payer: Self-pay | Admitting: Internal Medicine

## 2018-07-24 ENCOUNTER — Other Ambulatory Visit: Payer: Self-pay | Admitting: Internal Medicine

## 2018-10-25 ENCOUNTER — Other Ambulatory Visit: Payer: Self-pay | Admitting: Internal Medicine

## 2019-01-24 ENCOUNTER — Other Ambulatory Visit: Payer: Self-pay | Admitting: Internal Medicine

## 2019-01-26 ENCOUNTER — Encounter: Payer: Self-pay | Admitting: Internal Medicine

## 2019-01-26 ENCOUNTER — Ambulatory Visit (INDEPENDENT_AMBULATORY_CARE_PROVIDER_SITE_OTHER): Payer: BLUE CROSS/BLUE SHIELD | Admitting: Internal Medicine

## 2019-01-26 ENCOUNTER — Other Ambulatory Visit: Payer: Self-pay

## 2019-01-26 DIAGNOSIS — G2581 Restless legs syndrome: Secondary | ICD-10-CM

## 2019-01-26 DIAGNOSIS — Z79899 Other long term (current) drug therapy: Secondary | ICD-10-CM

## 2019-01-26 DIAGNOSIS — R739 Hyperglycemia, unspecified: Secondary | ICD-10-CM | POA: Diagnosis not present

## 2019-01-26 DIAGNOSIS — E079 Disorder of thyroid, unspecified: Secondary | ICD-10-CM | POA: Diagnosis not present

## 2019-01-26 DIAGNOSIS — G4733 Obstructive sleep apnea (adult) (pediatric): Secondary | ICD-10-CM

## 2019-01-26 MED ORDER — LEVOTHYROXINE SODIUM 112 MCG PO TABS: 112.0000 ug | ORAL_TABLET | Freq: Every day | ORAL | 0 refills | Status: DC

## 2019-01-26 NOTE — Telephone Encounter (Signed)
Please disregard this message wrong pt

## 2019-01-26 NOTE — Telephone Encounter (Signed)
Pt has appt scheduled.

## 2019-01-26 NOTE — Progress Notes (Signed)
Virtual Visit via Video Note  I connected with@ on 01/26/19 at  2:45 PM EDT by a video enabled telemedicine application and verified that I am speaking with the correct person using two identifiers. Location patient: home Location provider:work or home office Persons participating in the virtual visit: patient, provider  [place] national recommendations  regarding COVID 19 pandemic   video visit is advised over in office visit for this patient.  Discussed the limitations of evaluation and management by telemedicine and  availability of in person appointments. The patient expressed understanding and agreed to proceed.   HPI: Angelica Gray Last cpx was jan 2019  Needs refill of her thyroid medicine. Since her last visit no major changes in her health cardiovascular pulmonary hair skin.  Her weight is about the same.  She does not check her blood pressure has been okay.    ROS: See pertinent positives and negatives per HPI.  Gets occasional hip pain but no major fall or disability with this. Has diagnosed with a sleep apnea but states that the CPAP machine does not work really well for her so she is not using any interventions.  No current GI problems.  Past Medical History:  Diagnosis Date  . Arthritis   . Atrophic kidney left  . History of hiatal hernia   . History of renal stone 2004  . Hyperlipidemia   . Hypothyroidism   . Insomnia   . Iron deficiency anemia   . 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 02/01/2015  . RESTLESS LEG SYNDROME, MILD 11/07/2007   Qualifier: Diagnosis of  By: Lovell SheehanJenkins MD, Balinda QuailsJohn E   . Sleep apnea    no CPAP     Past Surgical History:  Procedure Laterality Date  . COLONOSCOPY    . ESOPHAGOGASTRODUODENOSCOPY    . HIATAL HERNIA REPAIR N/A 09/05/2015   Procedure: LAPAROSCOPIC REPAIR OF HIATAL HERNIA;  Surgeon: Ovidio Kinavid Newman, MD;  Location: [facility];  Service: General;  Laterality: N/A;  . KIDNEY STONE SURGERY    . MANDIBLE  FRACTURE SURGERY    . REFRACTIVE SURGERY    . TUBAL LIGATION  2001    Family History  Problem Relation Age of Onset  . Kidney failure Mother        from dm and ht  had 11 children   . Hyperlipidemia Mother   . Diabetes Mother   . Thyroid disease Mother   . Diabetes Father   . Heart attack Father        dec age 58  . Thyroid disease Sister   . Throat cancer Paternal Grandmother        dipped stuff  . Esophageal cancer Paternal Grandmother   . Colon cancer Neg Hx   . Stomach cancer Neg Hx     SOCIAL HX: Working from home at this point about 30 hours a week.  About 6 hours asleep  Current Outpatient Medications:  .  Cholecalciferol (EQL VITAMIN D3) 2000 units CAPS, Take 2,000 Units by mouth daily., Disp: , Rfl:  .  levothyroxine (SYNTHROID, LEVOTHROID) 112 MCG tablet, Take 1 tablet (112 mcg total) by mouth daily., Disp: 90 tablet, Rfl: 0  EXAM:  VITALS per patient if applicable: Video connection was good and auditory had a lot of static but adequate visit.  GENERAL: alert, oriented, appears well and in no acute distress  HEENT: atraumatic, conjunttiva clear, no obvious abnormalities on inspection of external nose and ears  NECK:  normal movements of the head and neck no obvious increase in goiter( patient states that it waxes and wanes but no major changes.)  LUNGS: on inspection no signs of respiratory distress, breathing rate appears normal, no obvious gross SOB, gasping or wheezing  CV: no obvious cyanosis  MS: moves all visible extremities without noticeable abnormality  PSYCH/NEURO: pleasant and cooperative, no obvious depression or anxiety, speech and thought processing grossly intact Lab Results  Component Value Date   WBC 5.1 11/04/2017   HGB 14.3 11/04/2017   HCT 42.4 11/04/2017   PLT 204.0 11/04/2017   GLUCOSE 104 (H) 11/04/2017   CHOL 157 11/04/2017   TRIG 94.0 11/04/2017   HDL 44.80 11/04/2017   LDLDIRECT 122.0 02/15/2010   LDLCALC 93 11/04/2017   ALT  15 11/04/2017   AST 16 11/04/2017   NA 138 11/04/2017   K 4.4 11/04/2017   CL 104 11/04/2017   CREATININE 0.92 11/04/2017   BUN 17 11/04/2017   CO2 28 11/04/2017   TSH 1.52 11/04/2017   INR 1.02 04/18/2010   HGBA1C 6.2 11/04/2017   Wt Readings from Last 3 Encounters:  11/04/17 231 lb (104.8 kg)  07/05/17 230 lb 12.8 oz (104.7 kg)  01/04/17 229 lb (103.9 kg)     ASSESSMENT AND PLAN:  Discussed the following assessment and plan:  Thyroid disorder - with goiter  plan  hands on  refill med and plan labs  - Plan: Basic metabolic panel, CBC with Differential/Platelet, Hemoglobin A1c, Hepatic function panel, Lipid panel, TSH  Medication management - Plan: Basic metabolic panel, CBC with Differential/Platelet, Hemoglobin A1c, Hepatic function panel, Lipid panel, TSH  Hyperglycemia - plan future orders  - Plan: Basic metabolic panel, CBC with Differential/Platelet, Hemoglobin A1c, Hepatic function panel, Lipid panel, TSH  RLS (restless legs syndrome) - Plan: Basic metabolic panel, CBC with Differential/Platelet, Hemoglobin A1c, Hepatic function panel, Lipid panel, TSH  OSA (obstructive sleep apnea) - not using cpap - Plan: Basic metabolic panel, CBC with Differential/Platelet, Hemoglobin A1c, Hepatic function panel, Lipid panel, TSH BP Readings from Last 3 Encounters:  11/04/17 118/76  07/05/17 102/80  01/04/17 118/86    Over due for lab monitoring  Orders placed  Make appt  Refill med x 1  adherence reportatly excellent  Plan hands on   Exam this summer  Or when possible for cpx   Expectant management and discussion of plan and treatment with patient with opportunity to ask questions and all were answered. The patient  demonstrated an understanding of the instructions.   The patient was advised to call back or seek an in-person evaluation if  having concerns    In the interim     Berniece Andreas, MD

## 2019-01-26 NOTE — Telephone Encounter (Signed)
Pt has made appt for cpe in June but states she cannot afford office visit due to insurance. Please advise if able to fill until cpe pt will not do a virtual visit  

## 2019-04-15 ENCOUNTER — Other Ambulatory Visit: Payer: Self-pay | Admitting: Internal Medicine

## 2019-06-25 ENCOUNTER — Other Ambulatory Visit: Payer: Self-pay | Admitting: Internal Medicine

## 2019-07-20 DIAGNOSIS — U071 COVID-19: Secondary | ICD-10-CM | POA: Diagnosis not present

## 2019-07-20 DIAGNOSIS — E669 Obesity, unspecified: Secondary | ICD-10-CM | POA: Diagnosis not present

## 2019-07-20 DIAGNOSIS — R0602 Shortness of breath: Secondary | ICD-10-CM | POA: Diagnosis not present

## 2019-07-20 DIAGNOSIS — N182 Chronic kidney disease, stage 2 (mild): Secondary | ICD-10-CM | POA: Diagnosis not present

## 2019-07-20 DIAGNOSIS — T380X5A Adverse effect of glucocorticoids and synthetic analogues, initial encounter: Secondary | ICD-10-CM | POA: Diagnosis not present

## 2019-07-20 DIAGNOSIS — J1289 Other viral pneumonia: Secondary | ICD-10-CM | POA: Diagnosis not present

## 2019-07-20 DIAGNOSIS — J9600 Acute respiratory failure, unspecified whether with hypoxia or hypercapnia: Secondary | ICD-10-CM | POA: Diagnosis not present

## 2019-07-20 DIAGNOSIS — Z20828 Contact with and (suspected) exposure to other viral communicable diseases: Secondary | ICD-10-CM | POA: Diagnosis not present

## 2019-07-20 DIAGNOSIS — Z6836 Body mass index (BMI) 36.0-36.9, adult: Secondary | ICD-10-CM | POA: Diagnosis not present

## 2019-07-20 DIAGNOSIS — E039 Hypothyroidism, unspecified: Secondary | ICD-10-CM | POA: Diagnosis not present

## 2019-07-20 DIAGNOSIS — G4733 Obstructive sleep apnea (adult) (pediatric): Secondary | ICD-10-CM | POA: Diagnosis not present

## 2019-07-20 DIAGNOSIS — R531 Weakness: Secondary | ICD-10-CM | POA: Diagnosis not present

## 2019-07-20 DIAGNOSIS — Z7409 Other reduced mobility: Secondary | ICD-10-CM | POA: Diagnosis not present

## 2019-07-20 DIAGNOSIS — E871 Hypo-osmolality and hyponatremia: Secondary | ICD-10-CM | POA: Diagnosis not present

## 2019-07-20 DIAGNOSIS — R739 Hyperglycemia, unspecified: Secondary | ICD-10-CM | POA: Diagnosis not present

## 2019-07-20 DIAGNOSIS — J9601 Acute respiratory failure with hypoxia: Secondary | ICD-10-CM | POA: Diagnosis not present

## 2019-07-20 DIAGNOSIS — K219 Gastro-esophageal reflux disease without esophagitis: Secondary | ICD-10-CM | POA: Diagnosis not present

## 2019-07-31 MED ORDER — CULTURELLE PO CAPS
2.00 | ORAL_CAPSULE | ORAL | Status: DC
Start: 2019-07-31 — End: 2019-07-31

## 2019-07-31 MED ORDER — GUAIFENESIN ER 600 MG PO TB12
600.00 | ORAL_TABLET | ORAL | Status: DC
Start: 2019-07-31 — End: 2019-07-31

## 2019-07-31 MED ORDER — GLUCOSE 40 % PO GEL
15.00 | ORAL | Status: DC
Start: ? — End: 2019-07-31

## 2019-07-31 MED ORDER — TEMAZEPAM 15 MG PO CAPS
15.00 | ORAL_CAPSULE | ORAL | Status: DC
Start: ? — End: 2019-07-31

## 2019-07-31 MED ORDER — GENERIC EXTERNAL MEDICATION
1000.00 | Status: DC
Start: 2019-07-31 — End: 2019-07-31

## 2019-07-31 MED ORDER — FAMOTIDINE 20 MG PO TABS
40.00 | ORAL_TABLET | ORAL | Status: DC
Start: 2019-07-31 — End: 2019-07-31

## 2019-07-31 MED ORDER — GUAIFENESIN 100 MG/5ML PO SYRP
200.00 | ORAL_SOLUTION | ORAL | Status: DC
Start: ? — End: 2019-07-31

## 2019-07-31 MED ORDER — DEXTROSE 10 % IV SOLN
125.00 | INTRAVENOUS | Status: DC
Start: ? — End: 2019-07-31

## 2019-07-31 MED ORDER — MELATONIN 3 MG PO TABS
3.00 | ORAL_TABLET | ORAL | Status: DC
Start: 2019-07-31 — End: 2019-07-31

## 2019-07-31 MED ORDER — ZINC SULFATE 220 (50 ZN) MG PO CAPS
220.00 | ORAL_CAPSULE | ORAL | Status: DC
Start: 2019-08-01 — End: 2019-07-31

## 2019-07-31 MED ORDER — LEVOTHYROXINE SODIUM 112 MCG PO TABS
112.00 | ORAL_TABLET | ORAL | Status: DC
Start: 2019-08-01 — End: 2019-07-31

## 2019-07-31 MED ORDER — BENZONATATE 100 MG PO CAPS
100.00 | ORAL_CAPSULE | ORAL | Status: DC
Start: ? — End: 2019-07-31

## 2019-07-31 MED ORDER — GLUCAGON HCL RDNA (DIAGNOSTIC) 1 MG IJ SOLR
1.00 | INTRAMUSCULAR | Status: DC
Start: ? — End: 2019-07-31

## 2019-07-31 MED ORDER — INSULIN LISPRO 100 UNIT/ML ~~LOC~~ SOLN
0.00 | SUBCUTANEOUS | Status: DC
Start: 2019-07-31 — End: 2019-07-31

## 2019-07-31 MED ORDER — ENOXAPARIN SODIUM 40 MG/0.4ML ~~LOC~~ SOLN
40.00 | SUBCUTANEOUS | Status: DC
Start: 2019-07-31 — End: 2019-07-31

## 2019-08-11 NOTE — Progress Notes (Signed)
Virtual Visit via Video Note  I connected with@ on 08/12/19 at 10:30 AM EDT by a video enabled telemedicine application and verified that I am speaking with the correct person using two identifiers. Location patient: home Location provider:work  office Persons participating in the virtual visit: patient, provider  [place] national recommendations  regarding COVID 19 pandemic   video visit is advised over in office visit for this patient.  Patient aware  of the limitations of evaluation and management by telemedicine and  availability of in person appointments. and agreed to proceed.   HPI: Angelica Gray presents for video visit  Fu hospital from covid  pna   10/ 5 - 10 /16   For covid19 resp failures with hypoxia   She presented to the hospital [facility] after symptoms of a head cold and then a cough that became seriously short of breath after about 5 to 7 days she had previously traveled to [place] on September 26 and thinks she may have gotten sick from someone on the plane from [place] to [place].   78 sat on room air   Assoc class 2 obesity  Had ICU care .  DC RA sats were 88 %   94 on 1-2 liters  Fu to include assessment of blood sugar as she was on dexmethasone for 10 day in addition to 5 days remdesivir  She is now at home using oxygen at night and in the day "as needed".  When she took a shower this morning it did go down to 74 excuse me 81 but came back up to 95 after she sat down and took deep breathing.  She is currently not using her oxygen during the visit.  She does get intermittent coughing that is difficult at times ran out of the [employer] and asked about cough medicine she had in the hospital.  She will cough some at night there is no hemoptysis or fever.  She is working from home minimally has a flexible supervision and can work as needed.  No diarrhea vomiting change in appetite. She was due to come in for her thyroid labs anyway the  discharge summary had advised follow-up blood sugar chemistry and a blood count. She has been cleared for contagion after discharge from the hospital. ROS: See pertinent positives and negatives per HPI.  No localized chest pain hemoptysis fever chills.  She is up and around.  Gets tired quite easily.  Does not have an easy way to check her blood sugar.   Past Medical History:  Diagnosis Date  . Arthritis   . Atrophic kidney left  . History of hiatal hernia   . History of renal stone 2004  . Hyperlipidemia   . Hypothyroidism   . Insomnia   . Iron deficiency anemia   . 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 02/01/2015  . RESTLESS LEG SYNDROME, MILD 11/07/2007   Qualifier: Diagnosis of  By: Lovell Sheehan MD, Balinda Quails   . Sleep apnea    no CPAP     Past Surgical History:  Procedure Laterality Date  . COLONOSCOPY    . ESOPHAGOGASTRODUODENOSCOPY    . HIATAL HERNIA REPAIR N/A 09/05/2015   Procedure: LAPAROSCOPIC REPAIR OF HIATAL HERNIA;  Surgeon: Ovidio Kin, MD;  Location: [facility];  Service: General;  Laterality: N/A;  . KIDNEY STONE SURGERY    . MANDIBLE FRACTURE SURGERY    . REFRACTIVE SURGERY    .  TUBAL LIGATION  2001    Family History  Problem Relation Age of Onset  . Kidney failure Mother        from dm and ht  had 11 children   . Hyperlipidemia Mother   . Diabetes Mother   . Thyroid disease Mother   . Diabetes Father   . Heart attack Father        dec age 58  . Thyroid disease Sister   . Throat cancer Paternal Grandmother        dipped stuff  . Esophageal cancer Paternal Grandmother   . Colon cancer Neg Hx   . Stomach cancer Neg Hx     Social History   Tobacco Use  . Smoking status: Never Smoker  . Smokeless tobacco: Never Used  Substance Use Topics  . Alcohol use: No    Alcohol/week: 0.0 standard drinks  . Drug use: No      Current Outpatient Medications:  .  Cholecalciferol (EQL VITAMIN D3) 2000 units CAPS, Take 2,000 Units by  mouth daily., Disp: , Rfl:  .  EUTHYROX 112 MCG tablet, Take 1 tablet by mouth once daily, Disp: 90 tablet, Rfl: 0 .  benzonatate (TESSALON) 100 MG capsule, Take 1 capsule (100 mg total) by mouth 3 (three) times daily as needed for cough., Disp: 21 capsule, Rfl: 1 .  levothyroxine (SYNTHROID, LEVOTHROID) 112 MCG tablet, Take 1 tablet by mouth once daily, Disp: 90 tablet, Rfl: 0  EXAM: BP Readings from Last 3 Encounters:  11/04/17 118/76  07/05/17 102/80  01/04/17 118/86    VITALS per patient if applicable:  GENERAL: alert, oriented, appears well and in no acute distress her speech is normal has occasional wheezy coughing.  HEENT: atraumatic, conjunttiva clear, no obvious abnormalities on inspection of external nose and ears  NECK: normal movements of the head and neck  LUNGS: on inspection no signs of respiratory distress, breathing rate appears normal, no obvious gross SOB, gasping or wheezing she states her current pulse ox when she takes a deep breath is 95 and will drop down to 91.  CV: no obvious cyanosis  MS: moves all visible extremities without noticeable abnormality  PSYCH/NEURO: pleasant and cooperative, no obvious depression or anxiety, speech and thought processing grossly intact Lab Results  Component Value Date   WBC 5.1 11/04/2017   HGB 14.3 11/04/2017   HCT 42.4 11/04/2017   PLT 204.0 11/04/2017   GLUCOSE 104 (H) 11/04/2017   CHOL 157 11/04/2017   TRIG 94.0 11/04/2017   HDL 44.80 11/04/2017   LDLDIRECT 122.0 02/15/2010   LDLCALC 93 11/04/2017   ALT 15 11/04/2017   AST 16 11/04/2017   NA 138 11/04/2017   K 4.4 11/04/2017   CL 104 11/04/2017   CREATININE 0.92 11/04/2017   BUN 17 11/04/2017   CO2 28 11/04/2017   TSH 1.52 11/04/2017   INR 1.02 04/18/2010   HGBA1C 6.2 11/04/2017  Blood work from hospital reviewed.  She had a normal liver panel except for an albumin of 3 white count 9.5 creatinine 0.77 her last x-ray showed stable cardiomegaly diffuse  pulmonary infiltrates.  Her CT scan did not show pulmonary emboli but was consistent with viral pneumonia. As stated her discharge room air saturation was 88 on oxygen 1 to 2 L 94%. ASSESSMENT AND PLAN:  Discussed the following assessment and plan:    ICD-10-CM   1. Acute hypoxemic respiratory failure due to COVID-19 (HCC)  U07.1    J96.01   2.  Hospital discharge follow-up  Z09   3. Hyperglycemia  R73.9    aggravated by steroids  needs fu   4. Medication management  Z79.899   5. Thyroid disorder  E07.9    Plan to have an in person visit next week November 2 Monday at 11:00 in the morning.  At that point we can do her blood work follow-up including her thyroid.  We will refill her Tessalon Perles use guaifenesin DM at night for now encouraged her to use her oxygen when she is in up and around to avoid fatigue that she has to catch up from. Told her that did not make a difference in weaning time and her situation at this time. She should continue to improve let me know if she needs a note for her work situation. If significant decompensation seek emergent care or contact us. She is continuing her zinc  vit dc d  Course of pepcid .  Counseled.   Expectant management and discussion of plan and treatment with opportunity to ask questions and all were answered. The patient agreed with the plan and demonstrated an understanding of the instructions.  Return for plan lkab and OV NOv 2 11 am   lab cbcdiff cmp and tsh and hg a1c .  Advised to call back or seek an in-person evaluation if worsening  or having  further concerns .   Shanon Ace, MD

## 2019-08-12 ENCOUNTER — Telehealth (INDEPENDENT_AMBULATORY_CARE_PROVIDER_SITE_OTHER): Payer: BC Managed Care – PPO | Admitting: Internal Medicine

## 2019-08-12 ENCOUNTER — Encounter: Payer: Self-pay | Admitting: Internal Medicine

## 2019-08-12 ENCOUNTER — Other Ambulatory Visit: Payer: Self-pay

## 2019-08-12 DIAGNOSIS — R739 Hyperglycemia, unspecified: Secondary | ICD-10-CM

## 2019-08-12 DIAGNOSIS — U071 COVID-19: Secondary | ICD-10-CM | POA: Diagnosis not present

## 2019-08-12 DIAGNOSIS — Z79899 Other long term (current) drug therapy: Secondary | ICD-10-CM | POA: Diagnosis not present

## 2019-08-12 DIAGNOSIS — E079 Disorder of thyroid, unspecified: Secondary | ICD-10-CM | POA: Diagnosis not present

## 2019-08-12 DIAGNOSIS — J9601 Acute respiratory failure with hypoxia: Secondary | ICD-10-CM

## 2019-08-12 DIAGNOSIS — Z09 Encounter for follow-up examination after completed treatment for conditions other than malignant neoplasm: Secondary | ICD-10-CM

## 2019-08-12 MED ORDER — BENZONATATE 100 MG PO CAPS: 100.0000 mg | ORAL_CAPSULE | Freq: Three times a day (TID) | ORAL | 1 refills | Status: DC | PRN

## 2019-08-17 ENCOUNTER — Other Ambulatory Visit: Payer: Self-pay

## 2019-08-17 ENCOUNTER — Encounter: Payer: Self-pay | Admitting: Internal Medicine

## 2019-08-17 ENCOUNTER — Ambulatory Visit (INDEPENDENT_AMBULATORY_CARE_PROVIDER_SITE_OTHER): Payer: BC Managed Care – PPO | Admitting: Internal Medicine

## 2019-08-17 VITALS — BP 130/68 | HR 77 | Temp 97.8°F | Wt 225.4 lb

## 2019-08-17 DIAGNOSIS — Z79899 Other long term (current) drug therapy: Secondary | ICD-10-CM

## 2019-08-17 DIAGNOSIS — R739 Hyperglycemia, unspecified: Secondary | ICD-10-CM | POA: Diagnosis not present

## 2019-08-17 DIAGNOSIS — U071 COVID-19: Secondary | ICD-10-CM

## 2019-08-17 DIAGNOSIS — G2581 Restless legs syndrome: Secondary | ICD-10-CM

## 2019-08-17 DIAGNOSIS — Z13220 Encounter for screening for lipoid disorders: Secondary | ICD-10-CM | POA: Diagnosis not present

## 2019-08-17 DIAGNOSIS — G4733 Obstructive sleep apnea (adult) (pediatric): Secondary | ICD-10-CM

## 2019-08-17 DIAGNOSIS — E079 Disorder of thyroid, unspecified: Secondary | ICD-10-CM

## 2019-08-17 LAB — HEPATIC FUNCTION PANEL
ALT: 30 U/L (ref 0–35)
AST: 21 U/L (ref 0–37)
Albumin: 4.1 g/dL (ref 3.5–5.2)
Alkaline Phosphatase: 122 U/L — ABNORMAL HIGH (ref 39–117)
Bilirubin, Direct: 0.2 mg/dL (ref 0.0–0.3)
Total Bilirubin: 0.8 mg/dL (ref 0.2–1.2)
Total Protein: 6.9 g/dL (ref 6.0–8.3)

## 2019-08-17 LAB — LIPID PANEL
Cholesterol: 165 mg/dL (ref 0–200)
HDL: 37.7 mg/dL — ABNORMAL LOW (ref >39.00)
LDL Cholesterol: 105 mg/dL — ABNORMAL HIGH (ref 0–99)
NonHDL: 127.69
Total CHOL/HDL Ratio: 4
Triglycerides: 113 mg/dL (ref 0.0–149.0)
VLDL: 22.6 mg/dL (ref 0.0–40.0)

## 2019-08-17 LAB — COMPREHENSIVE METABOLIC PANEL
ALT: 30 U/L (ref 0–35)
AST: 21 U/L (ref 0–37)
Albumin: 4.1 g/dL (ref 3.5–5.2)
Alkaline Phosphatase: 122 U/L — ABNORMAL HIGH (ref 39–117)
BUN: 10 mg/dL (ref 6–23)
CO2: 27 mEq/L (ref 19–32)
Calcium: 9.2 mg/dL (ref 8.4–10.5)
Chloride: 105 mEq/L (ref 96–112)
Creatinine, Ser: 0.86 mg/dL (ref 0.40–1.20)
GFR: 67.8 mL/min (ref >60.00)
Glucose, Bld: 124 mg/dL — ABNORMAL HIGH (ref 70–99)
Potassium: 4.3 mEq/L (ref 3.5–5.1)
Sodium: 139 mEq/L (ref 135–145)
Total Bilirubin: 0.8 mg/dL (ref 0.2–1.2)
Total Protein: 6.9 g/dL (ref 6.0–8.3)

## 2019-08-17 LAB — CBC WITH DIFFERENTIAL/PLATELET
Basophils Absolute: 0 10*3/uL (ref 0.0–0.1)
Basophils Relative: 0.6 % (ref 0.0–3.0)
Eosinophils Absolute: 0.1 10*3/uL (ref 0.0–0.7)
Eosinophils Relative: 2.7 % (ref 0.0–5.0)
HCT: 41.8 % (ref 36.0–46.0)
Hemoglobin: 14 g/dL (ref 12.0–15.0)
Lymphocytes Relative: 25.6 % (ref 12.0–46.0)
Lymphs Abs: 1.2 10*3/uL (ref 0.7–4.0)
MCHC: 33.5 g/dL (ref 30.0–36.0)
MCV: 91 fl (ref 78.0–100.0)
Monocytes Absolute: 0.4 10*3/uL (ref 0.1–1.0)
Monocytes Relative: 8.3 % (ref 3.0–12.0)
Neutro Abs: 3 10*3/uL (ref 1.4–7.7)
Neutrophils Relative %: 62.8 % (ref 43.0–77.0)
Platelets: 191 10*3/uL (ref 150.0–400.0)
RBC: 4.6 Mil/uL (ref 3.87–5.11)
RDW: 14.7 % (ref 11.5–15.5)
WBC: 4.8 10*3/uL (ref 4.0–10.5)

## 2019-08-17 LAB — TSH: TSH: 0.77 u[IU]/mL (ref 0.35–4.50)

## 2019-08-17 LAB — HEMOGLOBIN A1C: Hgb A1c MFr Bld: 6.9 % — ABNORMAL HIGH (ref 4.6–6.5)

## 2019-08-17 MED ORDER — LEVOTHYROXINE SODIUM 112 MCG PO TABS: 112.0000 ug | ORAL_TABLET | Freq: Every day | ORAL | 3 refills | Status: DC

## 2019-08-17 NOTE — Progress Notes (Signed)
Chief Complaint  Patient presents with  . Hospitalization Follow-up    from covid 19 patient still having cough     HPI: Angelica Gray 58 y.o. come in for  Post hospital   For resp faitlure  From covid 19 see last weeks   Note.    Since last week she has improved she feels about 60% she has "weaned herself off the oxygen" most days during the day and is not using it every night.  When she "does a little too much" she does feel short of breath still has some cough using Tessalon Perles Mucinex no hemoptysis fever or new symptoms. She needs a refill from her thyroid medicine had continued to receive it during her hospitalization she was overdue for testing. She feels like her eating is normal no swelling of the legs bleeding. She continues to work from home sort of as tolerated.  Does not have restrictions on her at this time.  Does feel tired at times and has to take a rest.  She states she has sleep apnea and probably should be on something but is not.   ROS: See pertinent positives and negatives per HPI.  No current chest pain loss of taste or smell hemoptysis.  Past Medical History:  Diagnosis Date  . Arthritis   . Atrophic kidney left  . History of hiatal hernia   . History of renal stone 2004  . Hyperlipidemia   . Hypothyroidism   . Insomnia   . Iron deficiency anemia   . 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 02/01/2015  . RESTLESS LEG SYNDROME, MILD 11/07/2007   Qualifier: Diagnosis of  By: Arnoldo Morale MD, Balinda Quails   . Sleep apnea    no CPAP     Family History  Problem Relation Age of Onset  . Kidney failure Mother        from dm and ht  had 11 children   . Hyperlipidemia Mother   . Diabetes Mother   . Thyroid disease Mother   . Diabetes Father   . Heart attack Father        dec age 56  . Thyroid disease Sister   . Throat cancer Paternal Grandmother        dipped stuff  . Esophageal cancer Paternal Grandmother   . Colon cancer Neg Hx    . Stomach cancer Neg Hx     Social History   Socioeconomic History  . Marital status: Married    Spouse name: Not on file  . Number of children: 4  . Years of education: Not on file  . Highest education level: Not on file  Occupational History  . Occupation: special events planner    Comment: [employer]  . Financial resource strain: Not on file  . Food insecurity    Worry: Not on file    Inability: Not on file  . Transportation needs    Medical: Not on file    Non-medical: Not on file  Tobacco Use  . Smoking status: Never Smoker  . Smokeless tobacco: Never Used  Substance and Sexual Activity  . Alcohol use: No    Alcohol/week: 0.0 standard drinks  . Drug use: No  . Sexual activity: Never  Lifestyle  . Physical activity    Days per week: Not on file    Minutes per session: Not on file  . Stress: Not on file  Relationships  . Social  connections    Talks on phone: Not on file    Gets together: Not on file    Attends religious service: Not on file    Active member of club or organization: Not on file    Attends meetings of clubs or organizations: Not on file    Relationship status: Not on file  Other Topics Concern  . 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 Ascorbic Acid (VITAMIN C) 1000 MG tablet Take by mouth.    . benzonatate (TESSALON) 100 MG capsule Take 1 capsule (100 mg total) by mouth 3 (three) times daily as needed for cough. 21 capsule 1  . Cholecalciferol (EQL VITAMIN D3) 2000 units CAPS Take 2,000 Units by mouth daily.    Marland Kitchen levothyroxine (SYNTHROID, LEVOTHROID) 112 MCG tablet Take 1 tablet by mouth once daily 90 tablet 0  . zinc sulfate 220 (50 Zn) MG capsule Take by mouth.    Janann August 112 MCG tablet Take 1 tablet by mouth  once daily 90 tablet 0   No facility-administered medications prior to visit.      EXAM:  BP 130/68 (BP Location: Right Arm, Patient Position: Sitting, Cuff Size: Large)   Pulse 77   Temp 97.8 F (36.6 C) (Temporal)   Wt 225 lb 6.4 oz (102.2 kg)   SpO2 97%   BMI 37.22 kg/m   Body mass index is 37.22 kg/m.  GENERAL: vitals reviewed and listed above, alert, oriented, appears well hydrated and in no acute distress her pulse ox repeated was 97,96% at rest.  Occasional minor cough occasionally appears breathless but her pulse ox is good this is after talking a good bit. HEENT: atraumatic, conjunctiva  clear, no obvious abnormalities on inspection of external nose and ears OP : Masked. NECK: no obvious masses on inspection palpation   Thyroid palpable  LUNGS: clear to auscultation bilaterally, no wheezes, rales or rhonchi, CV: HRRR, no clubbing cyanosis or  peripheral edema nl cap refill no JVD was seen. Abdomen soft without organomegaly guarding or rebound. MS: moves all extremities without noticeable focal  abnormality PSYCH: pleasant and cooperative, no obvious depression or anxiety Lab Results  Component Value Date   WBC 5.1 11/04/2017   HGB 14.3 11/04/2017   HCT 42.4 11/04/2017   PLT 204.0 11/04/2017   GLUCOSE 104 (H) 11/04/2017   CHOL 157 11/04/2017   TRIG 94.0 11/04/2017   HDL 44.80 11/04/2017   LDLDIRECT 122.0 02/15/2010   LDLCALC 93 11/04/2017   ALT 15 11/04/2017   AST 16 11/04/2017   NA 138 11/04/2017   K 4.4 11/04/2017   CL 104 11/04/2017   CREATININE 0.92 11/04/2017   BUN 17 11/04/2017   CO2 28 11/04/2017   TSH 1.52 11/04/2017   INR 1.02 04/18/2010   HGBA1C 6.2 11/04/2017   BP Readings from Last 3 Encounters:  08/17/19 130/68  11/04/17 118/76  07/05/17 102/80  is fasting today x water and thyroid med   ASSESSMENT AND PLAN:  Discussed the following assessment and plan:  COVID-19 virus infection - Plan: CBC with Differential/Platelet, CMP, Hemoglobin  A1c, TSH, Hepatic Function Panel, CANCELED: Hemoglobin A1c, CANCELED: TSH, CANCELED: CMP, CANCELED: CBC with Differential/Platelet  Hyperglycemia -  Plan: CBC with Differential/Platelet, CMP, Hemoglobin A1c, TSH, Lipid panel, Hepatic Function Panel, CANCELED: Hemoglobin A1c, CANCELED: TSH, CANCELED: CMP, CANCELED: CBC with Differential/Platelet, CANCELED: Lipid panel, CANCELED: TSH, CANCELED: Lipid panel, CANCELED: Hepatic function panel, CANCELED: Hemoglobin A1c, CANCELED: CBC with Differential/Platelet, CANCELED: Basic metabolic panel  Thyroid disorder - Plan: CBC with Differential/Platelet, CMP, Hemoglobin A1c, TSH, Hepatic Function Panel, CANCELED: Hemoglobin A1c, CANCELED: TSH, CANCELED: CMP, CANCELED: CBC with Differential/Platelet, CANCELED: TSH, CANCELED: Lipid panel, CANCELED: Hepatic function panel, CANCELED: Hemoglobin A1c, CANCELED: CBC with Differential/Platelet, CANCELED: Basic metabolic panel  Medication management - Plan: CBC with Differential/Platelet, CMP, Hemoglobin A1c, TSH, Lipid panel, Hepatic Function Panel, CANCELED: Hemoglobin A1c, CANCELED: TSH, CANCELED: CMP, CANCELED: CBC with Differential/Platelet, CANCELED: Lipid panel, CANCELED: TSH, CANCELED: Lipid panel, CANCELED: Hepatic function panel, CANCELED: Hemoglobin A1c, CANCELED: CBC with Differential/Platelet, CANCELED: Basic metabolic panel  OSA (obstructive sleep apnea) - Plan: CBC with Differential/Platelet, CMP, Hemoglobin A1c, TSH, Hepatic Function Panel, CANCELED: Hemoglobin A1c, CANCELED: TSH, CANCELED: CMP, CANCELED: CBC with Differential/Platelet, CANCELED: TSH, CANCELED: Lipid panel, CANCELED: Hepatic function panel, CANCELED: Hemoglobin A1c, CANCELED: CBC with Differential/Platelet, CANCELED: Basic metabolic panel  Screening, lipid - Plan: Lipid panel, Hepatic Function Panel, CANCELED: Lipid panel  Hyperglycemia - plan future orders  - Plan: CBC with Differential/Platelet, CMP, Hemoglobin A1c, TSH, Lipid panel,  Hepatic Function Panel, CANCELED: Hemoglobin A1c, CANCELED: TSH, CANCELED: CMP, CANCELED: CBC with Differential/Platelet, CANCELED: Lipid panel, CANCELED: TSH, CANCELED: Lipid panel, CANCELED: Hepatic function panel, CANCELED: Hemoglobin A1c, CANCELED: CBC with Differential/Platelet, CANCELED: Basic metabolic panel  Thyroid disorder - with goiter  plan  hands on  refill med and plan labs  - Plan: CBC with Differential/Platelet, CMP, Hemoglobin A1c, TSH, Hepatic Function Panel, CANCELED: Hemoglobin A1c, CANCELED: TSH, CANCELED: CMP, CANCELED: CBC with Differential/Platelet, CANCELED: TSH, CANCELED: Lipid panel, CANCELED: Hepatic function panel, CANCELED: Hemoglobin A1c, CANCELED: CBC with Differential/Platelet, CANCELED: Basic metabolic panel  RLS (restless legs syndrome) - Plan: Hepatic Function Panel, CANCELED: TSH, CANCELED: Lipid panel, CANCELED: Hepatic function panel, CANCELED: Hemoglobin A1c, CANCELED: CBC with Differential/Platelet, CANCELED: Basic metabolic panel  OSA (obstructive sleep apnea) - not using cpap - Plan: CBC with Differential/Platelet, CMP, Hemoglobin A1c, TSH, Hepatic Function Panel, CANCELED: Hemoglobin A1c, CANCELED: TSH, CANCELED: CMP, CANCELED: CBC with Differential/Platelet, CANCELED: TSH, CANCELED: Lipid panel, CANCELED: Hepatic function panel, CANCELED: Hemoglobin A1c, CANCELED: CBC with Differential/Platelet, CANCELED: Basic metabolic panel   At this point she is improving but still has significant fatigue and some respiratory symptoms states that her pulse ox will occasionally drop into the 80s discussed use of oxygen as needed to not overdo things. We will refill her thyroid medicine today plan labs video visit in a month Would have her discuss with her work if needed I can send a letter of restriction of limiting to 4 hours a day or similar as tolerated for the next couple weeks. She had a flu shot as she went out of the hospital had declined a pneumonia shot. Told her  she can get that sometime in the future. Return for depending on labs   1 month video  visit ok .  -Patient advised to return or notify health care team  if  new concerns arise.  Patient Instructions  Continue to take it easy as discussed .  Will notify you  of labs when available.    Expect to take a few more weeks to feel closer to normal .   Let us know about document .     Perhaps no more than 4  hours  Per day or as tolerated for the next 2 weeks.   Weill refill  thryoud med today .         Neta Mends.  M.D.

## 2019-08-17 NOTE — Patient Instructions (Signed)
Continue to take it easy as discussed .  Will notify you  of labs when available.    Expect to take a few more weeks to feel closer to normal .   Let us know about document .     Perhaps no more than 4 hours  Per day or as tolerated for the next 2 weeks.   Weill refill  thryoud med today .

## 2019-08-31 DIAGNOSIS — U071 COVID-19: Secondary | ICD-10-CM | POA: Diagnosis not present

## 2019-09-07 ENCOUNTER — Encounter: Payer: Self-pay | Admitting: Internal Medicine

## 2019-09-07 ENCOUNTER — Telehealth (INDEPENDENT_AMBULATORY_CARE_PROVIDER_SITE_OTHER): Payer: BC Managed Care – PPO | Admitting: Internal Medicine

## 2019-09-07 ENCOUNTER — Other Ambulatory Visit: Payer: Self-pay

## 2019-09-07 DIAGNOSIS — R739 Hyperglycemia, unspecified: Secondary | ICD-10-CM

## 2019-09-07 DIAGNOSIS — Z79899 Other long term (current) drug therapy: Secondary | ICD-10-CM

## 2019-09-07 DIAGNOSIS — G4733 Obstructive sleep apnea (adult) (pediatric): Secondary | ICD-10-CM

## 2019-09-07 DIAGNOSIS — U071 COVID-19: Secondary | ICD-10-CM | POA: Diagnosis not present

## 2019-09-07 NOTE — Progress Notes (Signed)
Virtual Visit via Video Note  I connected with@ on 09/07/19 at  8:30 AM EST by a video enabled telemedicine application and verified that I am speaking with the correct person using two identifiers. Location patient: home Location provider:work  office Persons participating in the virtual visit: patient, provider  [place] national recommendations  regarding COVID 19 pandemic   video visit is advised over in office visit for this patient.  Patient aware  of the limitations of evaluation and management by telemedicine and  availability of in person appointments. and agreed to proceed.   HPI: Angelica Gray presents for video visit follow-up from hospitalization and significant lung symptoms pneumonia from COVID-19.  Since her last visit post hospital she is continue to improve is now not using the oxygen for the last 10 days or so pulse ox is running 94-97.  She can walk a quarter of a mile before she stops from fatigue. Cough is occasional.  She feels that she is over 90% better almost back to baseline. She gets occasional what she calls panic that last for seconds where she feels like she cannot catch her breath but then calms down and it is fine she feels this is left over from the illness. She is still working from home. Additional history she was diagnosed with obstructive sleep apnea in the past and was using her brother's machine the mask does not fit right so she is not really using any treatment.  ROS: See pertinent positives and negatives per HPI.  Past Medical History:  Diagnosis Date  . Arthritis   . Atrophic kidney left  . History of hiatal hernia   . History of renal stone 2004  . Hyperlipidemia   . Hypothyroidism   . Insomnia   . Iron deficiency anemia   . 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 02/01/2015  . RESTLESS LEG SYNDROME, MILD 11/07/2007   Qualifier: Diagnosis of  By: Arnoldo Morale MD, Balinda Quails   . Sleep apnea    no CPAP     Past Surgical  History:  Procedure Laterality Date  . COLONOSCOPY    . ESOPHAGOGASTRODUODENOSCOPY    . HIATAL HERNIA REPAIR N/A 09/05/2015   Procedure: LAPAROSCOPIC REPAIR OF HIATAL HERNIA;  Surgeon: Alphonsa Overall, MD;  Location: [facility];  Service: General;  Laterality: N/A;  . KIDNEY STONE SURGERY    . MANDIBLE FRACTURE SURGERY    . REFRACTIVE SURGERY    . TUBAL LIGATION  2001    Family History  Problem Relation Age of Onset  . Kidney failure Mother        from dm and ht  had 11 children   . Hyperlipidemia Mother   . Diabetes Mother   . Thyroid disease Mother   . Diabetes Father   . Heart attack Father        dec age 69  . Thyroid disease Sister   . Throat cancer Paternal Grandmother        dipped stuff  . Esophageal cancer Paternal Grandmother   . Colon cancer Neg Hx   . Stomach cancer Neg Hx     Social History   Tobacco Use  . Smoking status: Never Smoker  . Smokeless tobacco: Never Used  Substance Use Topics  . Alcohol use: No    Alcohol/week: 0.0 standard drinks  . Drug use: No      Current Outpatient Medications:  .  Ascorbic Acid (VITAMIN C) 1000 MG tablet, Take by  mouth., Disp: , Rfl:  .  benzonatate (TESSALON) 100 MG capsule, Take 1 capsule (100 mg total) by mouth 3 (three) times daily as needed for cough., Disp: 21 capsule, Rfl: 1 .  Cholecalciferol (EQL VITAMIN D3) 2000 units CAPS, Take 2,000 Units by mouth daily., Disp: , Rfl:  .  levothyroxine (EUTHYROX) 112 MCG tablet, Take 1 tablet (112 mcg total) by mouth daily., Disp: 90 tablet, Rfl: 3 .  levothyroxine (SYNTHROID, LEVOTHROID) 112 MCG tablet, Take 1 tablet by mouth once daily, Disp: 90 tablet, Rfl: 0  EXAM: BP Readings from Last 3 Encounters:  08/17/19 130/68  11/04/17 118/76  07/05/17 102/80    VITALS per patient if applicable: She looks quite well today no coughing respiratory distress.  GENERAL: alert, oriented, appears well and in no acute distress  HEENT: atraumatic, conjunttiva clear, no obvious  abnormalities on inspection of external nose and ears  NECK: normal movements of the head and neck  LUNGS: on inspection no signs of respiratory distress, breathing rate appears normal, no obvious gross SOB, gasping or wheezing  CV: no obvious cyanosis  MS: moves all visible extremities without noticeable abnormality  PSYCH/NEURO: pleasant and cooperative, no obvious depression or anxiety, speech and thought processing grossly intact Lab Results  Component Value Date   WBC 4.8 08/17/2019   HGB 14.0 08/17/2019   HCT 41.8 08/17/2019   PLT 191.0 08/17/2019   GLUCOSE 124 (H) 08/17/2019   CHOL 165 08/17/2019   TRIG 113.0 08/17/2019   HDL 37.70 (L) 08/17/2019   LDLDIRECT 122.0 02/15/2010   LDLCALC 105 (H) 08/17/2019   ALT 30 08/17/2019   ALT 30 08/17/2019   AST 21 08/17/2019   AST 21 08/17/2019   NA 139 08/17/2019   K 4.3 08/17/2019   CL 105 08/17/2019   CREATININE 0.86 08/17/2019   BUN 10 08/17/2019   CO2 27 08/17/2019   TSH 0.77 08/17/2019   INR 1.02 04/18/2010   HGBA1C 6.9 (H) 08/17/2019   Wt Readings from Last 3 Encounters:  08/17/19 225 lb 6.4 oz (102.2 kg)  11/04/17 231 lb (104.8 kg)  07/05/17 230 lb 12.8 oz (104.7 kg)     AASSESSMENT AND PLAN:  Discussed the following assessment and plan:    ICD-10-CM   1. COVID-19 virus infection convalescent  U07.1   2. OSA (obstructive sleep apnea)  G47.33 Ambulatory referral to Pulmonology   Not being treated see text  3. Medication management  Z79.899   4. Hyperglycemia  R73.9    A1c in the early diabetic range is now off of all steroids prefers lifestyle intervention of the next 3 months and recheck Metformin discussed and offered.    Counseled.   Expectant management and discussion of plan and treatment with opportunity to ask questions and all were answered. The patient agreed with the plan and demonstrated an understanding of the instructions. We will DC the oxygen Discussed referral for a sleep apnea she needs  better control of this patient agrees Plan CPX and follow-up in about 3 months check A1c other labs as appropriate at that time.  Advised to call back or seek an in-person evaluation if worsening  or having  further concerns . Return in about 3 months (around 12/08/2019) for preventive /cpx and medications  CPX  oin February 2021 .   Berniece Andreas, MD

## 2019-10-14 ENCOUNTER — Other Ambulatory Visit: Payer: Self-pay

## 2019-10-14 ENCOUNTER — Ambulatory Visit (INDEPENDENT_AMBULATORY_CARE_PROVIDER_SITE_OTHER): Payer: Self-pay

## 2019-10-14 ENCOUNTER — Encounter: Payer: Self-pay | Admitting: Internal Medicine

## 2019-10-14 ENCOUNTER — Telehealth (INDEPENDENT_AMBULATORY_CARE_PROVIDER_SITE_OTHER): Payer: BC Managed Care – PPO | Admitting: Internal Medicine

## 2019-10-14 DIAGNOSIS — Z8616 Personal history of COVID-19: Secondary | ICD-10-CM

## 2019-10-14 DIAGNOSIS — Z8619 Personal history of other infectious and parasitic diseases: Secondary | ICD-10-CM

## 2019-10-14 DIAGNOSIS — R001 Bradycardia, unspecified: Secondary | ICD-10-CM

## 2019-10-14 DIAGNOSIS — R002 Palpitations: Secondary | ICD-10-CM

## 2019-10-14 NOTE — Progress Notes (Signed)
Virtual Visit via Video Note  I connected with@ on 10/14/19 at 10:00 AM EST by a video enabled telemedicine application and verified that I am speaking with the correct person using two identifiers. Location patient: home Location provider:r home office Persons participating in the virtual visit: patient, provider  [place] national recommendations  regarding COVID 19 pandemic   video visit is advised over in office visit for this patient.  Patient aware  of the limitations of evaluation and management by telemedicine and  availability of in person appointments. and agreed to proceed.   HPI: Angelica Gray presents for video visit Because of concerns of low pulse rate that she noted on her pulse ox when she is monitoring her oxygen level. She is continue to recover from severe Covid respiratory infection in October 2020.  Since that time her breathing is much better pulse ox runs in the high 90s and occasional cough fatigue is improved. Over the last 3 to 4 weeks she has noted an occasional palpitation and feels like she needs to take a deep breath but is not short of breath. Pulse ox heart rate monitor at times will go down to the 25-40 range and then come back up.  She has not taken her own pulse otherwise These episodes last mostly daily or not associated with syncope severe chest pain or dizziness.  Last less than a minute.  No fever swelling new shortness of breath. No new medications her last labs were in November and thyroid was in range.     ROS: See pertinent positives and negatives per HPI.  Past Medical History:  Diagnosis Date  . Arthritis   . Atrophic kidney left  . History of hiatal hernia   . History of renal stone 2004  . Hyperlipidemia   . Hypothyroidism   . Insomnia   . Iron deficiency anemia   . 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 02/01/2015  . RESTLESS LEG SYNDROME, MILD 11/07/2007   Qualifier: Diagnosis of  By: Arnoldo Morale MD, Balinda Quails    . Sleep apnea    no CPAP     Past Surgical History:  Procedure Laterality Date  . COLONOSCOPY    . ESOPHAGOGASTRODUODENOSCOPY    . HIATAL HERNIA REPAIR N/A 09/05/2015   Procedure: LAPAROSCOPIC REPAIR OF HIATAL HERNIA;  Surgeon: Alphonsa Overall, MD;  Location: [facility];  Service: General;  Laterality: N/A;  . KIDNEY STONE SURGERY    . MANDIBLE FRACTURE SURGERY    . REFRACTIVE SURGERY    . TUBAL LIGATION  2001    Family History  Problem Relation Age of Onset  . Kidney failure Mother        from dm and ht  had 11 children   . Hyperlipidemia Mother   . Diabetes Mother   . Thyroid disease Mother   . Diabetes Father   . Heart attack Father        dec age 71  . Thyroid disease Sister   . Throat cancer Paternal Grandmother        dipped stuff  . Esophageal cancer Paternal Grandmother   . Colon cancer Neg Hx   . Stomach cancer Neg Hx     Social History   Tobacco Use  . Smoking status: Never Smoker  . Smokeless tobacco: Never Used  Substance Use Topics  . Alcohol use: No    Alcohol/week: 0.0 standard drinks  . Drug use: No      Current Outpatient  Medications:  .  Ascorbic Acid (VITAMIN C) 1000 MG tablet, Take by mouth., Disp: , Rfl:  .  benzonatate (TESSALON) 100 MG capsule, Take 1 capsule (100 mg total) by mouth 3 (three) times daily as needed for cough., Disp: 21 capsule, Rfl: 1 .  Cholecalciferol (EQL VITAMIN D3) 2000 units CAPS, Take 2,000 Units by mouth daily., Disp: , Rfl:  .  levothyroxine (EUTHYROX) 112 MCG tablet, Take 1 tablet (112 mcg total) by mouth daily., Disp: 90 tablet, Rfl: 3 .  levothyroxine (SYNTHROID, LEVOTHROID) 112 MCG tablet, Take 1 tablet by mouth once daily, Disp: 90 tablet, Rfl: 0  EXAM: BP Readings from Last 3 Encounters:  08/17/19 130/68  11/04/17 118/76  07/05/17 102/80    VITALS per patient if applicable:  GENERAL: alert, oriented, appears well and in no acute distress  HEENT: atraumatic, conjunttiva clear, no obvious abnormalities on  inspection of external nose and ears  NECK: normal movements of the head and neck  LUNGS: on inspection no signs of respiratory distress, breathing rate appears normal, no obvious gross SOB, gasping or wheezing  CV: no obvious cyanosis rmality  PSYCH/NEURO: pleasant and cooperative, no obvious depression or anxiety, speech and thought processing grossly intact   ASSESSMENT AND PLAN:  Discussed the following assessment and plan:    ICD-10-CM   1. Heart rate slow  R00.1 DG Chest 2 View    Ambulatory referral to Cardiology    CANCELED: EKG 12-Lead  2. Palpitation  R00.2 DG Chest 2 View    Ambulatory referral to Cardiology    CANCELED: EKG 12-Lead  3. History of 2019 novel coronavirus disease (COVID-19)  Z86.19 DG Chest 2 View    Ambulatory referral to Cardiology   Low heart rate on pulse ox uncertain etiology sounds like a palpitation could be premature beats however need more information.  review of her hospital admission did not show myocarditis or acute myocardial injury that I could find. ( troponin was normal)  She has done much better in every other way. Discussed evaluation  get EKG and chest x-ray today in the office as baseline. May benefit from monitor and/or echo but would be optimally evaluated by cardiology referral consult. Discussed alarm symptoms that she does not have and go from there. Glad she is feeling so much better from other standpoints.  Counseled.   Expectant management and discussion of plan and treatment with opportunity to ask questions and all were answered. The patient agreed with the plan and demonstrated an understanding of the instructions.   Advised to call back or seek an in-person evaluation if worsening  or having  further concerns . In the interim  Return if symptoms worsen or fail to improve, for depending on labs and will do card referral .   Berniece Andreas, MD   ekg  Sinus rhythym   88  ocass ectopy nl intervals

## 2019-10-15 NOTE — Progress Notes (Signed)
Cxray shows resolving  viral pneumonia  I still want her to see Pulmonary  please check on referral  dx  sp covid osa  and make sure  is active

## 2019-10-22 NOTE — Progress Notes (Signed)
Cardiology Office Note   Date:  10/23/2019   ID:  Angelica Gray, DOB Apr 28, 1961, MRN [id number]  PCP:  Burnis Medin, MD  Cardiologist:   Minus Breeding, MD   Chief Complaint  Patient presents with  . Palpitations     History of Present Illness: Angelica Gray is a 59 y.o. female who was referred by Panosh, Standley Brooking, MD for evaluation of palpitations.  The patient says she has not felt well since she was hospitalized at [facility] with Covid in October.  I did review some of these records.  She was significantly ill and in fact I do see a chest x-ray which is still some patchy infiltrates done last month.  She said that she has not felt completely well since the virus.  Her oxygen levels have been okay.  However, she has multiple pulse ox readings where her heart rate is in the 20s or 30s.  She feels some skipping beats.  She feels occasionally lightheaded but not to the point of syncope or presyncope.  Her heart rate comes back quickly when it is reported to be low.  She never had a problem with this before.  She has some sporadic chest discomfort that seems to be more associated with some difficulty breathing when she ambulates.  She is now having a sleep propped up a little bit in the bed.  She is not had any weight gain or edema.  The cough that she was having has resolved.  She is not had any fevers or chills.  She has had no prior cardiac work-up.  She had no neck or jaw discomfort.  Past Medical History:  Diagnosis Date  . Arthritis   . Atrophic kidney left  . History of hiatal hernia   . History of renal stone 2004  . Hyperlipidemia   . Hypothyroidism   . Insomnia   . 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 02/01/2015  . RESTLESS LEG SYNDROME, MILD 11/07/2007   Qualifier: Diagnosis of  By: Arnoldo Morale MD, Balinda Quails   . Sleep apnea    no CPAP     Past Surgical History:  Procedure Laterality Date  . COLONOSCOPY    .  ESOPHAGOGASTRODUODENOSCOPY    . HIATAL HERNIA REPAIR N/A 09/05/2015   Procedure: LAPAROSCOPIC REPAIR OF HIATAL HERNIA;  Surgeon: Alphonsa Overall, MD;  Location: [facility];  Service: General;  Laterality: N/A;  . KIDNEY STONE SURGERY    . MANDIBLE FRACTURE SURGERY    . REFRACTIVE SURGERY    . TUBAL LIGATION  2001     Current Outpatient Medications  Medication Sig Dispense Refill  . Ascorbic Acid (VITAMIN C) 1000 MG tablet Take by mouth.    . Cholecalciferol (EQL VITAMIN D3) 2000 units CAPS Take 2,000 Units by mouth daily.    Marland Kitchen levothyroxine (SYNTHROID, LEVOTHROID) 112 MCG tablet Take 1 tablet by mouth once daily 90 tablet 0   No current facility-administered medications for this visit.    Allergies:   Patient has no known allergies.    Social History:  The patient  reports that she has never smoked. She has never used smokeless tobacco. She reports that she does not drink alcohol or use drugs.   Family History:  The patient's family history includes Diabetes in her father and mother; Esophageal cancer in her paternal grandmother; Heart attack in her father; Hyperlipidemia in her mother; Kidney failure in her mother; Throat cancer in  her paternal grandmother; Thyroid disease in her mother and sister.    ROS:  Please see the history of present illness.   Otherwise, review of systems are positive for none.   All other systems are reviewed and negative.    PHYSICAL EXAM: VS:  BP 112/88   Pulse (!) 107 Comment: WALKING  Temp (!) 97.2 F (36.2 C)   Ht 5' 5.25" (1.657 m)   Wt 225 lb 3.2 oz (102.2 kg)   SpO2 91% Comment: WALKING  BMI 37.19 kg/m  , BMI Body mass index is 37.19 kg/m. GENERAL:  Well appearing HEENT:  Pupils equal round and reactive, fundi not visualized, oral mucosa unremarkable NECK:  No jugular venous distention, waveform within normal limits, carotid upstroke brisk and symmetric, no bruits, no thyromegaly LYMPHATICS:  No cervical, inguinal adenopathy LUNGS: Mildly  decreased breath sounds with some coarse rhonchi. BACK:  No CVA tenderness CHEST:  Unremarkable HEART:  PMI not displaced or sustained,S1 and S2 within normal limits, no S3, no S4, no clicks, no rubs, no murmurs ABD:  Flat, positive bowel sounds normal in frequency in pitch, no bruits, no rebound, no guarding, no midline pulsatile mass, no hepatomegaly, no splenomegaly EXT:  2 plus pulses throughout, no edema, no cyanosis no clubbing SKIN:  No rashes no nodules NEURO:  Cranial nerves II through XII grossly intact, motor grossly intact throughout PSYCH:  Cognitively intact, oriented to person place and time    EKG:  EKG is not ordered today. The ekg ordered 1235 demonstrates sinus rhythm, rate 88, axis within normal limits, intervals within normal limits, no acute ST-T wave changes.   Recent Labs: 08/17/2019: ALT 30; ALT 30; BUN 10; Creatinine, Ser 0.86; Hemoglobin 14.0; Platelets 191.0; Potassium 4.3; Sodium 139; TSH 0.77    Lipid Panel    Component Value Date/Time   CHOL 165 08/17/2019 1204   TRIG 113.0 08/17/2019 1204   HDL 37.70 (L) 08/17/2019 1204   CHOLHDL 4 08/17/2019 1204   VLDL 22.6 08/17/2019 1204   LDLCALC 105 (H) 08/17/2019 1204   LDLDIRECT 122.0 02/15/2010 1025      Wt Readings from Last 3 Encounters:  10/23/19 225 lb 3.2 oz (102.2 kg)  08/17/19 225 lb 6.4 oz (102.2 kg)  11/04/17 231 lb (104.8 kg)      Other studies Reviewed: Additional studies/ records that were reviewed today include: CXR, [place] records. Review of the above records demonstrates:  Please see elsewhere in the note.     ASSESSMENT AND PLAN:  PALPITATIONS:   I suspect the patient's low heart rates are under counted ectopy but I am going to get a 3-day Holter monitor to further evaluate this.  Given her recent Covid she will also need an echocardiogram.  I will have a low threshold for an MRI.  ABNORMAL CXR: Her oxygen saturation did fall to 91% ambulating.  I contacted Dr. Marchelle Gearing.  It  seems like she is having some "long haul" residual Covid symptoms and he has agreed to see her and has scheduled her.  Recommend follow-up with a high-resolution CT.  EDUCATION: We can consider the Covid vaccine in the future.  Current medicines are reviewed at length with the patient today.  The patient does not have concerns regarding medicines.  The following changes have been made:  no change  Labs/ tests ordered today include:   Orders Placed This Encounter  Procedures  . LONG TERM MONITOR (3-14 DAYS)  . ECHOCARDIOGRAM COMPLETE     Disposition:  FU with me after the above testing.     Signed, Rollene Rotunda, MD  10/23/2019 1:22 PM    [facility]

## 2019-10-23 ENCOUNTER — Other Ambulatory Visit: Payer: Self-pay

## 2019-10-23 ENCOUNTER — Telehealth: Payer: Self-pay | Admitting: Radiology

## 2019-10-23 ENCOUNTER — Ambulatory Visit (INDEPENDENT_AMBULATORY_CARE_PROVIDER_SITE_OTHER): Payer: BC Managed Care – PPO | Admitting: Cardiology

## 2019-10-23 ENCOUNTER — Ambulatory Visit (HOSPITAL_COMMUNITY): Payer: BC Managed Care – PPO | Attending: Cardiology

## 2019-10-23 ENCOUNTER — Telehealth: Payer: Self-pay | Admitting: Internal Medicine

## 2019-10-23 ENCOUNTER — Encounter: Payer: Self-pay | Admitting: Cardiology

## 2019-10-23 VITALS — BP 112/88 | HR 107 | Temp 97.2°F | Ht 65.25 in | Wt 225.2 lb

## 2019-10-23 DIAGNOSIS — R0602 Shortness of breath: Secondary | ICD-10-CM | POA: Diagnosis not present

## 2019-10-23 DIAGNOSIS — R002 Palpitations: Secondary | ICD-10-CM

## 2019-10-23 DIAGNOSIS — Z7189 Other specified counseling: Secondary | ICD-10-CM | POA: Diagnosis not present

## 2019-10-23 LAB — ECHOCARDIOGRAM COMPLETE
Height: 65.25 in
Weight: 3603.2 oz

## 2019-10-23 NOTE — Telephone Encounter (Signed)
Triage/Emily  Dr Antoine Poche wants me to see [name] a[facility] for post covid (she is the mom of Patrizia Paule in research). Patient had covid some months ago and Rx at [facility]. Still with resp symptoms  Plan  - do HRCT supine and prone next week  - see me any 30 min slot < 2 weeks   -w ill need simple walk test on day of visit

## 2019-10-23 NOTE — Telephone Encounter (Signed)
Enrolled patient for a 3 Day Zio monitor to be mailed to patients home.  °

## 2019-10-23 NOTE — Patient Instructions (Addendum)
Medication Instructions:  Your physician recommends that you continue on your current medications as directed. Please refer to the Current Medication list given to you today.  *If you need a refill on your cardiac medications before your next appointment, please call your pharmacy*  Lab Work: NONE  Testing/Procedures: Your physician has requested that you have an echocardiogram. Echocardiography is a painless test that uses sound waves to create images of your heart. It provides your doctor with information about the size and shape of your heart and how well your heart's chambers and valves are working. This procedure takes approximately one hour. There are no restrictions for this procedure. [facility] AT [address]  3 DAY ZIO PATCH   Follow-Up: At [facility], you and your health needs are our priority.  As part of our continuing mission to provide you with exceptional heart care, we have created designated Provider Care Teams.  These Care Teams include your primary Cardiologist (physician) and Advanced Practice Providers (APPs -  Physician Assistants and Nurse Practitioners) who all work together to provide you with the care you need, when you need it.  Your next appointment:   IN OFFICE AFTER ABOVE TESTS COMPLETED   IF YOU DO NOT HEAR FROM THE PULMONARY OFFICE BY Monday PLEASE CALL THE OFFICE AT [phone]  Other Instructions  ZIO XT- Long Term Monitor Instructions   Your physician has requested you wear your ZIO patch monitor____3___days.   This is a single patch monitor.  [employer] supplies one patch monitor per enrollment.  Additional stickers are not available.   Please do not apply patch if you will be having a Nuclear Stress Test, Echocardiogram, Cardiac CT, MRI, or Chest Xray during the time frame you would be wearing the monitor. The patch cannot be worn during these tests.  You cannot remove and re-apply the ZIO XT patch monitor.   Your ZIO patch  monitor will be sent USPS Priority mail from [facility] directly to your home address. The monitor may also be mailed to a PO BOX if home delivery is not available.   It may take 3-5 days to receive your monitor after you have been enrolled.   Once you have received you monitor, please review enclosed instructions.  Your monitor has already been registered assigning a specific monitor serial # to you.   Applying the monitor   Shave hair from upper left chest.   Hold abrader disc by orange tab.  Rub abrader in 40 strokes over left upper chest as indicated in your monitor instructions.   Clean area with 4 enclosed alcohol pads .  Use all pads to assure are is cleaned thoroughly.  Let dry.   Apply patch as indicated in monitor instructions.  Patch will be place under collarbone on left side of chest with arrow pointing upward.   Rub patch adhesive wings for 2 minutes.Remove white label marked "1".  Remove white label marked "2".  Rub patch adhesive wings for 2 additional minutes.   While looking in a mirror, press and release button in center of patch.  A small green light will flash 3-4 times .  This will be your only indicator the monitor has been turned on.     Do not shower for the first 24 hours.  You may shower after the first 24 hours.   Press button if you feel a symptom. You will hear a small click.  Record Date, Time and Symptom in the Patient  Log Book.   When you are ready to remove patch, follow instructions on last 2 pages of Patient Log Book.  Stick patch monitor onto last page of Patient Log Book.   Place Patient Log Book in [place] box.  Use locking tab on box and tape box closed securely.  The [employer] has [employer] on it.  Please place in mailbox as soon as possible.  Your physician should have your test results approximately 7 days after the monitor has been mailed back to [facility].   Call [facility] at [phone] if you  have questions regarding your ZIO XT patch monitor.  Call them immediately if you see an orange light blinking on your monitor.   If your monitor falls off in less than 4 days contact our Monitor department at [phone].  If your monitor becomes loose or falls off after 4 days call [employer] at [phone] for suggestions on securing your monitor.     Echocardiogram An echocardiogram is a procedure that uses painless sound waves (ultrasound) to produce an image of the heart. Images from an echocardiogram can provide important information about:  Signs of coronary artery disease (CAD).  Aneurysm detection. An aneurysm is a weak or damaged part of an artery wall that bulges out from the normal force of blood pumping through the body.  Heart size and shape. Changes in the size or shape of the heart can be associated with certain conditions, including heart failure, aneurysm, and CAD.  Heart muscle function.  Heart valve function.  Signs of a past heart attack.  Fluid buildup around the heart.  Thickening of the heart muscle.  A tumor or infectious growth around the heart valves. Tell a health care provider about:  Any allergies you have.  All medicines you are taking, including vitamins, herbs, eye drops, creams, and over-the-counter medicines.  Any blood disorders you have.  Any surgeries you have had.  Any medical conditions you have.  Whether you are pregnant or may be pregnant. What are the risks? Generally, this is a safe procedure. However, problems may occur, including:  Allergic reaction to dye (contrast) that may be used during the procedure. What happens before the procedure? No specific preparation is needed. You may eat and drink normally. What happens during the procedure?   An IV tube may be inserted into one of your veins.  You may receive contrast through this tube. A contrast is an injection that improves the quality of the pictures from your  heart.  A gel will be applied to your chest.  A wand-like tool (transducer) will be moved over your chest. The gel will help to transmit the sound waves from the transducer.  The sound waves will harmlessly bounce off of your heart to allow the heart images to be captured in real-time motion. The images will be recorded on a computer. The procedure may vary among health care providers and hospitals. What happens after the procedure?  You may return to your normal, everyday life, including diet, activities, and medicines, unless your health care provider tells you not to do that. Summary  An echocardiogram is a procedure that uses painless sound waves (ultrasound) to produce an image of the heart.  Images from an echocardiogram can provide important information about the size and shape of your heart, heart muscle function, heart valve function, and fluid buildup around your heart.  You do not need to do anything to prepare before this procedure. You  may eat and drink normally.  After the echocardiogram is completed, you may return to your normal, everyday life, unless your health care provider tells you not to do that. This information is not intended to replace advice given to you by your health care provider. Make sure you discuss any questions you have with your health care provider. Document Revised: 01/22/2019 Document Reviewed: 11/03/2016 Elsevier Patient Education  2020 [employer].

## 2019-10-26 NOTE — Telephone Encounter (Signed)
Called and spoke with pt letting her know the info stated by MR that he wants her to have a ct done prior to appt with him. Stated to her that we could get her scheduled for appt with him at [place] office next Friday, 1/22 for a sooner appt and she verbalized understanding. Pt has been scheduled for appt with MR 1/22 at 9am and I provided her with the address. I have also placed order for the ct scan. Nothing further needed.

## 2019-10-26 NOTE — Telephone Encounter (Signed)
First available is 11/18/2019 with you is this ok?

## 2019-10-26 NOTE — Telephone Encounter (Signed)
Patient has called wanting to know about this very anxious please call pt.

## 2019-10-26 NOTE — Telephone Encounter (Signed)
HRCT this week. Based on results - wil order ONO even before visit. If I need to see her sooner based on CT results - we can figure out a plan after looking at CT. Need CT first  See me in [place] [facility] Friday 11/06/2019 - there are lot of open slot + she lives in [address] [place] [address] which is 35 min from [facility] while our office is 28-33min away. So, litteraly an extra 5 min drive to [facility] officde

## 2019-10-28 ENCOUNTER — Other Ambulatory Visit (INDEPENDENT_AMBULATORY_CARE_PROVIDER_SITE_OTHER): Payer: BC Managed Care – PPO

## 2019-10-28 ENCOUNTER — Other Ambulatory Visit: Payer: Self-pay

## 2019-10-28 ENCOUNTER — Ambulatory Visit (INDEPENDENT_AMBULATORY_CARE_PROVIDER_SITE_OTHER)
Admission: RE | Admit: 2019-10-28 | Discharge: 2019-10-28 | Disposition: A | Discharge status: Home / Self Care | Payer: BC Managed Care – PPO | Source: Ambulatory Visit | Attending: Internal Medicine | Admitting: Internal Medicine

## 2019-10-28 DIAGNOSIS — R002 Palpitations: Secondary | ICD-10-CM | POA: Diagnosis not present

## 2019-10-28 DIAGNOSIS — R0602 Shortness of breath: Secondary | ICD-10-CM | POA: Diagnosis not present

## 2019-10-28 DIAGNOSIS — R918 Other nonspecific abnormal finding of lung field: Secondary | ICD-10-CM | POA: Diagnosis not present

## 2019-10-28 DIAGNOSIS — K449 Diaphragmatic hernia without obstruction or gangrene: Secondary | ICD-10-CM | POA: Diagnosis not present

## 2019-11-05 ENCOUNTER — Encounter: Payer: Self-pay | Admitting: Internal Medicine

## 2019-11-05 ENCOUNTER — Ambulatory Visit (INDEPENDENT_AMBULATORY_CARE_PROVIDER_SITE_OTHER): Payer: BC Managed Care – PPO | Admitting: Internal Medicine

## 2019-11-05 ENCOUNTER — Other Ambulatory Visit: Payer: Self-pay

## 2019-11-05 ENCOUNTER — Institutional Professional Consult (permissible substitution): Payer: BC Managed Care – PPO | Admitting: Pulmonary Disease

## 2019-11-05 VITALS — BP 118/76 | HR 72 | Temp 97.6°F | Ht 65.25 in | Wt 228.0 lb

## 2019-11-05 DIAGNOSIS — G4733 Obstructive sleep apnea (adult) (pediatric): Secondary | ICD-10-CM

## 2019-11-05 DIAGNOSIS — N189 Chronic kidney disease, unspecified: Secondary | ICD-10-CM

## 2019-11-05 DIAGNOSIS — R002 Palpitations: Secondary | ICD-10-CM

## 2019-11-05 DIAGNOSIS — Z8616 Personal history of COVID-19: Secondary | ICD-10-CM

## 2019-11-05 DIAGNOSIS — G2581 Restless legs syndrome: Secondary | ICD-10-CM

## 2019-11-05 DIAGNOSIS — U071 COVID-19: Secondary | ICD-10-CM | POA: Insufficient documentation

## 2019-11-05 DIAGNOSIS — E049 Nontoxic goiter, unspecified: Secondary | ICD-10-CM | POA: Diagnosis not present

## 2019-11-05 DIAGNOSIS — D500 Iron deficiency anemia secondary to blood loss (chronic): Secondary | ICD-10-CM

## 2019-11-05 DIAGNOSIS — E039 Hypothyroidism, unspecified: Secondary | ICD-10-CM

## 2019-11-05 DIAGNOSIS — R0600 Dyspnea, unspecified: Secondary | ICD-10-CM | POA: Insufficient documentation

## 2019-11-05 DIAGNOSIS — I129 Hypertensive chronic kidney disease with stage 1 through stage 4 chronic kidney disease, or unspecified chronic kidney disease: Secondary | ICD-10-CM

## 2019-11-05 DIAGNOSIS — R7989 Other specified abnormal findings of blood chemistry: Secondary | ICD-10-CM

## 2019-11-05 NOTE — Assessment & Plan Note (Signed)
Dyspnea post Covid. Pending evaluation by Dr Marchelle Gearing.

## 2019-11-05 NOTE — Patient Instructions (Signed)
Order- schedule HST or Split Night sleep study    Dx OSA  Please call us about 2 weeks after your sleep study to see if results and recommendations are ready yet. If appropriate, we may be able to start treatment before we see you next.  Please call as needed.

## 2019-11-05 NOTE — Progress Notes (Signed)
11/05/19-  58 yoF never smoker for sleep evaluation Medical problem list includes OSA, Restless Legs, Hypothyroid/ Goiter, Atrophic R kidney, Morbid Obesity, Chronic Blood Loss Anemia, Abnormal LFTs, Palpitation, Covid Pneumonia 2020,  NPSG 09/12/15- AHI  75.8/ hr, desaturation to 66%, Body weight 220 lbs,  Has appt with Dr Marchelle Gearing tomorrow for eval post-Covid pneumonia. Body weight today 228 lbs After sleep study ordered by general surgeon in 2016, she tried bronther's CPAP machine w his full face mask. Claustrophobic and didn't continue long. Now aware she snores loudly, witnessed apneas, wakes during night, tired in the day. No complex parasomnias. No ENT surgery. No lung problem prior to Covid but now remains DOE, some cough w white sputum. Palpitations being eval by cardiology.   HRCT chest 10/28/19-  Ordered  By MR for eval post Covid pneumonia IMPRESSION: 1. Mild to moderate patchy ground-glass opacity, reticulation and distortion in both lungs with an upper lung predominance. No significant bronchiectasis or honeycombing. Opacities have improved since 07/20/2019 chest CT. Findings are compatible with postinflammatory fibrosis in a nonspecific interstitial pneumonia (NSIP) pattern. Findings are suggestive of an alternative diagnosis (not UIP) per consensus guidelines: Diagnosis of Idiopathic Pulmonary Fibrosis: An Official ATS/ERS/JRS/ALAT Clinical Practice Guideline. Am Rosezetta Schlatter Crit Care Med Vol 198, Iss 5, [phone], Jun 15 2017. 2. Mild patchy air trapping in both lungs, indicative of small airways disease. 3. Moderate hiatal hernia. 4. Diffuse hepatic steatosis.  Prior to Admission medications   Medication Sig Start Date End Date Taking? Authorizing Provider  Ascorbic Acid (VITAMIN C) 1000 MG tablet Take by mouth. 07/31/19  Yes [provider]  Cholecalciferol (EQL VITAMIN D3) 2000 units CAPS Take 2,000 Units by mouth daily.   Yes [provider]   levothyroxine (SYNTHROID, LEVOTHROID) 112 MCG tablet Take 1 tablet by mouth once daily 01/27/19  Yes Panosh, Neta Mends, MD   Past Medical History:  Diagnosis Date  . Arthritis   . Atrophic kidney left  . History of hiatal hernia   . History of renal stone 2004  . Hyperlipidemia   . Hypothyroidism   . Insomnia   . 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 02/01/2015  . RESTLESS LEG SYNDROME, MILD 11/07/2007   Qualifier: Diagnosis of  By: Lovell Sheehan MD, Balinda Quails   . Sleep apnea    no CPAP    Past Surgical History:  Procedure Laterality Date  . COLONOSCOPY    . ESOPHAGOGASTRODUODENOSCOPY    . HIATAL HERNIA REPAIR N/A 09/05/2015   Procedure: LAPAROSCOPIC REPAIR OF HIATAL HERNIA;  Surgeon: Ovidio Kin, MD;  Location: [facility];  Service: General;  Laterality: N/A;  . KIDNEY STONE SURGERY    . MANDIBLE FRACTURE SURGERY    . REFRACTIVE SURGERY    . TUBAL LIGATION  2001   Family History  Problem Relation Age of Onset  . Kidney failure Mother        from dm and ht  had 11 children   . Hyperlipidemia Mother   . Diabetes Mother   . Thyroid disease Mother   . Diabetes Father   . Heart attack Father        dec age 64  . Throat cancer Paternal Grandmother        dipped stuff  . Esophageal cancer Paternal Grandmother   . Thyroid disease Sister   . Colon cancer Neg Hx   . Stomach cancer Neg Hx    Social History   Socioeconomic History  . Marital  status: Married    Spouse name: Not on file  . Number of children: 4  . Years of education: Not on file  . Highest education level: Not on file  Occupational History  . Occupation: special events planner    Comment: Financial trader  Tobacco Use  . Smoking status: Never Smoker  . Smokeless tobacco: Never Used  Substance and Sexual Activity  . Alcohol use: No    Alcohol/week: 0.0 standard drinks  . Drug use: No  . Sexual activity: Never  Other Topics Concern  . 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 Difficulty of Paying Living Expenses: Not on file  Food Insecurity:   . Worried About Charity fundraiser in the Last Year: Not on file  . Ran Out of Food in the Last Year: Not on file  Transportation Needs:   . Lack of Transportation (Medical): Not on file  . Lack of Transportation (Non-Medical): Not on file  Physical Activity:   . Days of Exercise per Week: Not on file  . Minutes of Exercise per Session: Not on file  Stress:   . Feeling of Stress : Not on file  Social Connections:   . Frequency of Communication with Friends and Family: Not on file  . Frequency of Social Gatherings with Friends and Family: Not on file  . Attends Religious Services: Not on file  . Active Member of Clubs or Organizations: Not on file  . Attends Archivist Meetings: Not on file  . Marital Status: Not on file  Intimate Partner Violence:   . Fear of Current or Ex-Partner: Not on file  . Emotionally Abused: Not on file  . Physically Abused: Not on file  . Sexually Abused: Not on file   ROS-see HPI   + = positive Constitutional:    weight loss, night sweats, fevers, chills, fatigue, lassitude. HEENT:    headaches, +difficulty swallowing, tooth/dental problems, sore throat,       sneezing, itching, ear ache, nasal congestion, post nasal drip, snoring CV:    chest pain, orthopnea, PND, swelling in lower extremities, anasarca,                                  dizziness, palpitations Resp:  + shortness of breath with exertion or at rest.                productive cough,   non-productive cough, coughing up of blood.              change in color of mucus.  wheezing.   Skin:    rash or lesions. GI:  No-   heartburn, indigestion, abdominal pain, nausea, vomiting, diarrhea,                 change in bowel  habits, loss of appetite GU: dysuria, change in color of urine, no urgency or frequency.   flank pain. MS:   joint pain, stiffness, decreased range of motion, back pain. Neuro-     nothing unusual Psych:  change in mood or affect.  depression or anxiety.   memory loss.  OBJ- Physical Exam General- Alert, Oriented, Affect-appropriate, Distress- none acute, + overweight Skin- rash-none, lesions- none, excoriation- none Lymphadenopathy- none Head- atraumatic            Eyes- Gross vision intact, PERRLA, conjunctivae and secretions clear            Ears- Hearing, canals-normal            Nose- Clear, no-Septal dev, mucus, polyps, erosion, perforation             Throat- Mallampati IV , mucosa clear , drainage- none, tonsils- atrophic Neck- flexible , trachea midline, no stridor , thyroid+ goiter R lobe, carotid no bruit Chest - symmetrical excursion , unlabored           Heart/CV- RRR , no murmur , no gallop  , no rub, nl s1 s2                           - JVD- none , edema- none, stasis changes- none, varices- none           Lung- + slightly labored talking through mask, clear to P&A, wheeze- none, cough- none , dullness-none, rub- none           Chest wall-  Abd-  Br/ Gen/ Rectal- Not done, not indicated Extrem- cyanosis- none, clubbing, none, atrophy- none, strength- nl Neuro- grossly intact to observation

## 2019-11-05 NOTE — Assessment & Plan Note (Signed)
She says the only machine she had was borrowed from brother, never fitted for her and made her claustrophobic w full-face mask. She is interested in orl appliance, but those usually are not very effective if she is still as severe as she was in 2016. Plan- sleep study, then CPAP or oral appliance

## 2019-11-05 NOTE — Assessment & Plan Note (Signed)
Palpable R lobe of thyroid, evaluated elsewhere

## 2019-11-06 ENCOUNTER — Telehealth (HOSPITAL_COMMUNITY): Payer: Self-pay | Admitting: *Deleted

## 2019-11-06 ENCOUNTER — Ambulatory Visit: Payer: BC Managed Care – PPO | Admitting: Cardiology

## 2019-11-06 ENCOUNTER — Ambulatory Visit (INDEPENDENT_AMBULATORY_CARE_PROVIDER_SITE_OTHER): Payer: BC Managed Care – PPO | Admitting: Internal Medicine

## 2019-11-06 ENCOUNTER — Encounter: Payer: Self-pay | Admitting: Internal Medicine

## 2019-11-06 ENCOUNTER — Other Ambulatory Visit
Admission: RE | Admit: 2019-11-06 | Discharge: 2019-11-06 | Disposition: A | Discharge status: Home / Self Care | Payer: BC Managed Care – PPO | Attending: Internal Medicine | Admitting: Internal Medicine

## 2019-11-06 VITALS — BP 116/76 | HR 70 | Temp 97.1°F | Ht 66.0 in | Wt 225.0 lb

## 2019-11-06 DIAGNOSIS — R05 Cough: Secondary | ICD-10-CM | POA: Diagnosis not present

## 2019-11-06 DIAGNOSIS — R0609 Other forms of dyspnea: Secondary | ICD-10-CM

## 2019-11-06 DIAGNOSIS — R0600 Dyspnea, unspecified: Secondary | ICD-10-CM

## 2019-11-06 DIAGNOSIS — R053 Chronic cough: Secondary | ICD-10-CM

## 2019-11-06 DIAGNOSIS — R5383 Other fatigue: Secondary | ICD-10-CM

## 2019-11-06 DIAGNOSIS — R918 Other nonspecific abnormal finding of lung field: Secondary | ICD-10-CM

## 2019-11-06 DIAGNOSIS — Z9889 Other specified postprocedural states: Secondary | ICD-10-CM

## 2019-11-06 DIAGNOSIS — Z8616 Personal history of COVID-19: Secondary | ICD-10-CM

## 2019-11-06 DIAGNOSIS — L659 Nonscarring hair loss, unspecified: Secondary | ICD-10-CM

## 2019-11-06 DIAGNOSIS — Z8261 Family history of arthritis: Secondary | ICD-10-CM

## 2019-11-06 DIAGNOSIS — Z8669 Personal history of other diseases of the nervous system and sense organs: Secondary | ICD-10-CM

## 2019-11-06 DIAGNOSIS — R002 Palpitations: Secondary | ICD-10-CM

## 2019-11-06 DIAGNOSIS — Z8719 Personal history of other diseases of the digestive system: Secondary | ICD-10-CM

## 2019-11-06 LAB — CK TOTAL AND CKMB (NOT AT ARMC)
CK, MB: 2.3 ng/mL (ref 0.5–5.0)
Relative Index: 2.2 (ref 0.0–2.5)
Total CK: 105 U/L (ref 38–234)

## 2019-11-06 LAB — VITAMIN D 25 HYDROXY (VIT D DEFICIENCY, FRACTURES): Vit D, 25-Hydroxy: 36.83 ng/mL (ref 30–100)

## 2019-11-06 LAB — TSH: TSH: 2.192 u[IU]/mL (ref 0.350–4.500)

## 2019-11-06 LAB — T4, FREE: Free T4: 1.18 ng/dL — ABNORMAL HIGH (ref 0.61–1.12)

## 2019-11-06 LAB — SEDIMENTATION RATE: Sed Rate: 4 mm/hr (ref 0–30)

## 2019-11-06 NOTE — Addendum Note (Signed)
Addended by: Longino Trefz on: 11/06/2019 10:27 AM   Modules accepted: Orders  

## 2019-11-06 NOTE — Addendum Note (Signed)
Addended by: Jacai Kipp on: 11/06/2019 10:26 AM   Modules accepted: Orders  

## 2019-11-06 NOTE — Addendum Note (Signed)
Addended by: Deloria Lair on: 11/06/2019 10:25 AM   Modules accepted: Orders

## 2019-11-06 NOTE — Addendum Note (Signed)
Addended by: Emmry Hinsch on: 11/06/2019 10:28 AM   Modules accepted: Orders  

## 2019-11-06 NOTE — Addendum Note (Signed)
Addended by: Tatijana Bierly on: 11/06/2019 10:27 AM   Modules accepted: Orders  

## 2019-11-06 NOTE — Addendum Note (Signed)
Addended by: Hikari Tripp on: 11/06/2019 10:27 AM   Modules accepted: Orders  

## 2019-11-06 NOTE — Addendum Note (Signed)
Addended by: Ramatoulaye Pack on: 11/06/2019 10:26 AM   Modules accepted: Orders  

## 2019-11-06 NOTE — Addendum Note (Signed)
Addended by: Deloria Lair on: 11/06/2019 10:28 AM   Modules accepted: Orders

## 2019-11-06 NOTE — Addendum Note (Signed)
Addended by: Tamberly Pomplun on: 11/06/2019 10:25 AM   Modules accepted: Orders  

## 2019-11-06 NOTE — Telephone Encounter (Signed)
Received referral for this pt to participate in Pulmonary rehab at [facility] or [facility].  Called pt to see if she had a preference.  Pt remains unsure because she is still working,  Reviewed with pt that presently we have paused our onsite pulmonary rehab program and have transitioned everyone to virtual.  Talked with her about what would be involved and what she can expect from rehab staff.  Pt asked which days she would come and for how long.  Reviewed with her once we are permitted to bring patients back - the days we typically exercise.  Pt asked which days for [facility].  Advised her that I wasn't for sure and that they are closed on Fridays.  I will check with them on Monday and give her call.  Pt verbalized understanding. Alanson Aly, BSN Cardiac and Emergency planning/management officer

## 2019-11-06 NOTE — Patient Instructions (Addendum)
ICD-10-CM   1. History of 2019 novel coronavirus disease (COVID-19)  Z86.16   2. Dyspnea on exertion  R06.00   3. Chronic cough  R05   4. Fatigue, unspecified type  R53.83   5. Palpitations  R00.2   6. Hair loss  L65.9   7. Family history of rheumatoid arthritis  Z82.61   8. Ground glass opacity present on imaging of lung  R91.8   9. History of repair of hiatal hernia  Z98.890    Z87.19   62. History of obstructive sleep apnea  Z86.69     Plan  Serum: Vit D level,  TSH, FT4,  ESR, ACE, ANA, DS-DNA, RF, anti-CCP,  ANCA screen, MPO, PR-3, Total CK,  Aldolase,  cl-70, ssA, ssB, hypersensitivity pneumonitis panel  Do  1 month empiric  OTC prilosec 49m per day on empty stomach  Keep up appt with Dr HPercival Spanishfor heart rate issue  Definitely need to go through sleep apnea wokr up wit Dr  YAnnamaria Boots Refer ROval Linseyor MGershon MusselcOne for pulmonary rehab  Overall I think you need time and addressing above issues and exercise program to get you back to baseline.   Followup  - will call with results  - followup imaging timing based on clinical following but typically 6-12 months - [facility] for regular visit (non-ILD clinic)

## 2019-11-06 NOTE — Addendum Note (Signed)
Addended by: Deloria Lair on: 11/06/2019 10:27 AM   Modules accepted: Orders

## 2019-11-06 NOTE — Addendum Note (Signed)
Addended by: Tyrian Peart on: 11/06/2019 10:25 AM   Modules accepted: Orders  

## 2019-11-06 NOTE — Progress Notes (Signed)
OV 11/06/2019  Subjective:  Patient ID: Angelica Gray, female , DOB: Dec 18, 1960 , age 59 y.o. , MRN: [id number] , ADDRESS: [place] [id number]   11/06/2019 -   Chief Complaint  Patient presents with  . Follow-up    per Dr. Percival Spanish-- dx with covid 10/20220. CT 10/27/2018. pt reports of sob with exertion/talking, occ wheezing and occ dry cough at times prod with clear mucus mainly in the morning.      HPI Angelica Gray 59 y.o. -has been referred by Dr. Percival Spanish in cardiology.  Patient's daughter Caro Brundidge works in pulmonary research [employer] as Naval architect.  Is a post COVID-19 follow-up.  At baseline she has a history of obesity with previous history of restless legs, hiatal hernia repair by Dr. Alphonsa Overall 5 years ago, hypothyroid with a history of goiter.  She also has a history of sleep apnea with an November 2016 apnea-hypopnea index of 75.8/h with desaturations to 66%.  However she has had claustrophobia with CPAP and not using it.  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  She believes a person next to her was probably unwell.  When she returned she developed Covid symptoms.  After 5 days of having severe cough and fatigue at home she became dyspneic and ended up at [facility] where she was hospitalized into the intensive care unit.  Per the history she got heated high flow nasal cannula remdesivir and steroids but not plasma.  She was not intubated.  She spent a total of 11 days in the hospital and discharged home on oxygen.  She was on oxygen for further 2 weeks and then came off oxygen.  Since then she is having COVID-19 long-haul symptoms.  She tells me that overall her shortness of breath and cough have significantly improved but she still has significant amount of fatigue.  She also tells me that it is almost like she is forgotten how to breathe and  periodically she will have to remind herself to take a deep breath.  Even this has gotten better.  However the fatigue but has not gotten better.  In addition she noticed new onset of intermittent random bradycardia as noticed on a pulse ox monitor.  She believes the pulse ox monitor was accurate.  This is why she saw Dr. Percival Spanish whose notes I reviewed.  She is done cardiac event monitor.  The results of this is pending.  She is due to see Dr. Percival Spanish anytime.  In addition she is now being reevaluated for sleep apnea because she has not been wearing CPAP.  She just saw Dr. Baird Lyons yesterday November 05, 2019.  Sleep study has been ordered.    She has other multiple symptoms.  All of the symptoms are documented below on a Likert scale.  These are symptoms as they are currently.  She is worried about hair loss that apparently has accentuated after her COVID-19.  Palpitations still persist.  She does not think her acid reflux is active   [place] Symptom Assessment Numerical Scale 0 is no problem -> 10 worst problem 11/06/2019    No Pain -> Worst pain 0  No Tiredness -> Worset tiredness 7  No Nausea -> Worst nausea 1  No Depression -> Worst depression 1  No Anxiety -> Worst Anxiety 2  No Drowsiness -> Worst  Drowsiness 7  Best appetite-> Worst Appetitle 1  Best Feeling of well being -> Worst feeling 1  No dyspnea-> Worst dyspnea 6  Hair lss 8  palpitations 7  GERD 1       Simple office walk 185 feet x  3 laps goal with forehead probe 11/06/2019   O2 used ra  Number laps completed 3  Comments about pace mod  Resting Pulse Ox/HR 100% and 76/min  Final Pulse Ox/HR 96% and 105/min  Desaturated </= 88% no  Desaturated <= 3% points Yes, 4 points  Got Tachycardic >/= 90/min yes  Symptoms at end of test Mild dyspnea  Miscellaneous comments x      Personal visualization of her CT chest shows that in 2011 she had clear lung fields but with moderate hiatal hernia.  In her most recent  CT chest October 28, 2019 which is a few months after her COVID-19 pneumonia she has bilateral upper lobe groundglass opacities along with a moderate hiatal hernia.  According to the radiologist she did have an interim CT in October 2020 around the time of COVID-19 pneumonia and compared to that CT scan her infiltrates have improved.  Unfortunately I do not have access to the October 2020 CT chest.   Review of her labs indicate that in November 2020 her creatinine was normal at 0.86 mg percent and a liver function test was normal.Hemoglobin was 14 g%.  Her absolute eosinophil count was normal.  ROS - per HPI     has a past medical history of Arthritis, Atrophic kidney (left), History of hiatal hernia, History of renal stone (2004), Hyperlipidemia, Hypothyroidism, Insomnia, 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 (02/01/2015), RESTLESS LEG SYNDROME, MILD (11/07/2007), and Sleep apnea.   reports that she has never smoked. She has never used smokeless tobacco.  Past Surgical History:  Procedure Laterality Date  . COLONOSCOPY    . ESOPHAGOGASTRODUODENOSCOPY    . HIATAL HERNIA REPAIR N/A 09/05/2015   Procedure: LAPAROSCOPIC REPAIR OF HIATAL HERNIA;  Surgeon: Alphonsa Overall, MD;  Location: [facility];  Service: General;  Laterality: N/A;  . KIDNEY STONE SURGERY    . MANDIBLE FRACTURE SURGERY    . REFRACTIVE SURGERY    . TUBAL LIGATION  2001    No Known Allergies  Immunization History  Administered Date(s) Administered  . Hepatitis B 06/15/1998, 07/15/1998, 01/14/1999  . Influenza Split 08/13/2011, 07/16/2015  . Influenza,inj,Quad PF,6+ Mos 07/31/2019  . Td 05/15/1998, 11/07/2007    Family History  Problem Relation Age of Onset  . Kidney failure Mother        from dm and ht  had 11 children   . Hyperlipidemia Mother   . Diabetes Mother   . Thyroid disease Mother   . Diabetes Father   . Heart attack Father        dec age 71  . Throat cancer Paternal  Grandmother        dipped stuff  . Esophageal cancer Paternal Grandmother   . Thyroid disease Sister   . Colon cancer Neg Hx   . Stomach cancer Neg Hx      Current Outpatient Medications:  .  Ascorbic Acid (VITAMIN C) 1000 MG tablet, Take by mouth., Disp: , Rfl:  .  Cholecalciferol (EQL VITAMIN D3) 2000 units CAPS, Take 2,000 Units by mouth daily., Disp: , Rfl:  .  levothyroxine (SYNTHROID, LEVOTHROID) 112 MCG tablet, Take 1 tablet by mouth once daily, Disp: 90 tablet, Rfl:  0      Objective:   Vitals:   11/06/19 0913  BP: 116/76  Pulse: 70  Temp: (!) 97.1 F (36.2 C)  TempSrc: Temporal  SpO2: 96%  Weight: 225 lb (102.1 kg)  Height: 5' 6"  (1.676 m)    Estimated body mass index is 36.32 kg/m as calculated from the following:   Height as of this encounter: 5' 6"  (1.676 m).   Weight as of this encounter: 225 lb (102.1 kg).  @WEIGHTCHANGE @  [employer]   11/06/19 0913  Weight: 225 lb (102.1 kg)     Physical Exam  General Appearance:    Alert, cooperative, no distress, appears stated age - yes , Deconditioned looking - no , OBESE  - yes, Sitting on Wheelchair -  no  Head:    Normocephalic, without obvious abnormality, atraumatic  Eyes:    PERRL, conjunctiva/corneas clear,  Ears:    Normal TM's and external ear canals, both ears  Nose:   Nares normal, septum midline, mucosa normal, no drainage    or sinus tenderness. OXYGEN ON  - no . Patient is @ ra   Throat:   Lips, mucosa, and tongue normal; teeth and gums normal. Cyanosis on lips - no. Mallampatti class 3  Neck:   Supple, symmetrical, trachea midline, no adenopathy;    thyroid:  no enlargement/tenderness/nodules; no carotid   bruit or JVD  Back:     Symmetric, no curvature, ROM normal, no CVA tenderness  Lungs:     Distress - no , Wheeze no, Barrell Chest - no, Purse lip breathing - no, Crackles - no   Chest Wall:    No tenderness or deformity.    Heart:    Regular rate and rhythm, S1 and S2 normal, no rub    or gallop, Murmur - no  Breast Exam:    NOT DONE  Abdomen:     Soft, non-tender, bowel sounds active all four quadrants,    no masses, no organomegaly. Visceral obesity - no  Genitalia:   NOT DONE  Rectal:   NOT DONE  Extremities:   Extremities - normal, Has Cane - no, Clubbing - no, Edema - no  Pulses:   2+ and symmetric all extremities  Skin:   Stigmata of Connective Tissue Disease - no  Lymph nodes:   Cervical, supraclavicular, and axillary nodes normal  Psychiatric:  Neurologic:   Pleasant - yes, Anxious - no, Flat affect - no  CAm-ICU - neg, Alert and Oriented x 3 - yes, Moves all 4s - yes, Speech - normal, Cognition - intact           Assessment:       ICD-10-CM   1. History of 2019 novel coronavirus disease (COVID-19)  Z86.16   2. Dyspnea on exertion  R06.00   3. Chronic cough  R05   4. Fatigue, unspecified type  R53.83   5. Palpitations  R00.2   6. Hair loss  L65.9   7. Family history of rheumatoid arthritis  Z82.61   8. Ground glass opacity present on imaging of lung  R91.8   9. History of repair of hiatal hernia  Z98.890    Z87.19   50. History of obstructive sleep apnea  Z86.69    I think she is suffering from classic postviral recovery deconditioning and fatigue syndrome.  Certain normal viruses can produce significant amount of fatigue compared to other viruses.  Classic example would be Chickungunya.  It appears on the evidence  of further the COVID-19 symptoms are more accentuated and more severe and longer duration than other viral illnesses.  This should be expected given the normalcy of this virus.  In order to help her recover faster I think a referral to pulmonary rehabilitation would be in order.  In addition it is best to address all concomitant medical issues such as optimizing her sleep apnea and evaluating her bradycardia.  Also vitamin D deficiency and thyroid issues and undiagnosed rheumatologic conditions [strong family history of rheumatoid arthritis  particularly in her daughter, her maternal grandmother and father] need to be ruled out.  She is willing to go through this work-up.  I remain optimistic with the time that she will improve.    Plan:     Patient Instructions     ICD-10-CM   1. History of 2019 novel coronavirus disease (COVID-19)  Z86.16   2. Dyspnea on exertion  R06.00   3. Chronic cough  R05   4. Fatigue, unspecified type  R53.83   5. Palpitations  R00.2   6. Hair loss  L65.9   7. Family history of rheumatoid arthritis  Z82.61   8. Ground glass opacity present on imaging of lung  R91.8   9. History of repair of hiatal hernia  Z98.890    Z87.19   9. History of obstructive sleep apnea  Z86.69     Plan  Serum: Vit D level,  TSH, FT4,  ESR, ACE, ANA, DS-DNA, RF, anti-CCP,  ANCA screen, MPO, PR-3, Total CK,  Aldolase,  cl-70, ssA, ssB, hypersensitivity pneumonitis panel  Do  1 month empiric  OTC prilosec 42m per day on empty stomach  Keep up appt with Dr HPercival Spanishfor heart rate issue  Definitely need to go through sleep apnea wokr up wit Dr  YAnnamaria Boots Refer ROval Linseyor MGershon MusselcOne for pulmonary rehab  Overall I think you need time and addressing above issues and exercise program to get you back to baseline.   Followup  - will call with results  - followup imaging timing based on clinical following but typically 6-12 months - [facility] for regular visit (non-ILD clinic)           SIGNATURE    Dr. MBrand Males M.D., F.C.C.P,  Pulmonary and Critical Care Medicine Staff Physician, CCearfossDirector - Interstitial Lung Disease  Program  Pulmonary [place] [id number] Pager: [phone] If no answer or between  15:00h - 7:00h: call [phone] Telephone: [phone]  10:01 AM 11/06/2019

## 2019-11-06 NOTE — Addendum Note (Signed)
Addended by: Deloria Lair on: 11/06/2019 10:26 AM   Modules accepted: Orders

## 2019-11-06 NOTE — Addendum Note (Signed)
Addended by: Deloria Lair on: 11/06/2019 10:29 AM   Modules accepted: Orders

## 2019-11-06 NOTE — Addendum Note (Signed)
Addended by: Audreana Hancox on: 11/06/2019 10:26 AM   Modules accepted: Orders  

## 2019-11-06 NOTE — Addendum Note (Signed)
Addended by: Maxwell Marion A on: 11/06/2019 10:15 AM   Modules accepted: Orders

## 2019-11-07 LAB — ANA: Anti Nuclear Antibody (ANA): NEGATIVE

## 2019-11-07 LAB — ALDOLASE: Aldolase: 5.1 U/L (ref 3.3–10.3)

## 2019-11-07 LAB — ANTI-DNA ANTIBODY, DOUBLE-STRANDED: ds DNA Ab: <1 IU/mL (ref 0–9)

## 2019-11-07 LAB — SJOGREN'S SYNDROME ANTIBODS(SSA + SSB)
SSA (Ro) (ENA) Antibody, IgG: <0.2 AI (ref 0.0–0.9)
SSB (La) (ENA) Antibody, IgG: <0.2 AI (ref 0.0–0.9)

## 2019-11-07 LAB — RHEUMATOID FACTOR: Rheumatoid fact SerPl-aCnc: <10 IU/mL (ref 0.0–13.9)

## 2019-11-07 LAB — ANTI-SCLERODERMA ANTIBODY: Scleroderma (Scl-70) (ENA) Antibody, IgG: <0.2 AI (ref 0.0–0.9)

## 2019-11-07 LAB — ANGIOTENSIN CONVERTING ENZYME: Angiotensin-Converting Enzyme: 45 U/L (ref 14–82)

## 2019-11-08 LAB — MISC LABCORP TEST (SEND OUT): Labcorp test code: 163840

## 2019-11-09 ENCOUNTER — Telehealth: Payer: Self-pay | Admitting: Internal Medicine

## 2019-11-09 LAB — ANCA TITERS
Atypical P-ANCA titer: <1:20 {titer}
C-ANCA: <1:20 {titer}
P-ANCA: <1:20 {titer}

## 2019-11-09 LAB — CYCLIC CITRUL PEPTIDE ANTIBODY, IGG/IGA: CCP Antibodies IgG/IgA: 6 units (ref 0–19)

## 2019-11-09 NOTE — Telephone Encounter (Signed)
PCCs please advise if rehab order can be sent to [facility] as pt requests or if a new order must be placed to reflect this.  Thanks!

## 2019-11-10 NOTE — Telephone Encounter (Signed)
LVM for Angelica Gray [facility] and advised of the response received from [facility] and to call back if there are any questions. Nothing further needed at this time.

## 2019-11-10 NOTE — Telephone Encounter (Signed)
Per the note on the order, it appear it has already been forwarded to [facility]:  Type Date User Summary Attachment  General 11/09/2019 12:17 PM Chelsea Aus, RN - -  Note   Spoke with [facility] pulmonary rehab program and relayed to pt their class times.  With pt working schedule, the [place] program would be better.  Pt is interested in the 7:00 class time. Pt wonders about her insurance and if they will cover it.   Pt given their contact information.  Will forward this referral to them via fax. Alanson Aly, BSN Cardiac and Emergency planning/management officer          . Type Date User Summary Attachment  General 11/06/2019 3:55 PM Chelsea Aus, RN - -  Note   Received referral for this pt to participate in Pulmonary rehab at [facility] or [facility].  Called pt to see if she had a preference.  Pt remains unsure because she is still working,  Reviewed with pt that presently we have paused our onsite pulmonary rehab program and have transitioned everyone to virtual.  Talked with her about what would be involved and what she can expect from rehab staff.  Pt asked which days she would come and for how long.  Reviewed with her once we are permitted to bring patients back - the days we typically exercise.  Pt asked which days for [facility].  Advised her that I wasn't for sure and that they are closed on Fridays.  I will check with them on Monday and give her call.  Pt verbalized understanding. Alanson Aly, BSN Cardiac and Emergency planning/management officer

## 2019-11-11 DIAGNOSIS — R002 Palpitations: Secondary | ICD-10-CM | POA: Diagnosis not present

## 2019-11-11 LAB — HYPERSENSITIVITY PNEUMONITIS
A. Pullulans Abs: NEGATIVE
A.Fumigatus #1 Abs: NEGATIVE
Micropolyspora faeni, IgG: NEGATIVE
Pigeon Serum Abs: NEGATIVE
Thermoact. Saccharii: NEGATIVE
Thermoactinomyces vulgaris, IgG: NEGATIVE

## 2019-11-29 NOTE — Progress Notes (Signed)
All blood work esp for autoimmune is  normal. FT4 slightly high - something to discuss with PCP Panosh, Neta Mends, MD or Endocrine.

## 2019-12-02 ENCOUNTER — Ambulatory Visit: Payer: BC Managed Care – PPO

## 2019-12-02 ENCOUNTER — Other Ambulatory Visit: Payer: Self-pay

## 2019-12-02 DIAGNOSIS — G4733 Obstructive sleep apnea (adult) (pediatric): Secondary | ICD-10-CM

## 2019-12-06 DIAGNOSIS — G4733 Obstructive sleep apnea (adult) (pediatric): Secondary | ICD-10-CM | POA: Diagnosis not present

## 2019-12-09 DIAGNOSIS — G4733 Obstructive sleep apnea (adult) (pediatric): Secondary | ICD-10-CM | POA: Diagnosis not present

## 2020-01-07 ENCOUNTER — Telehealth: Payer: Self-pay | Admitting: Internal Medicine

## 2020-01-07 NOTE — Telephone Encounter (Signed)
Spoke with patient. She verbalized understanding about her test results. She is interested in the oral appliance with Dr. Myrtis Ser. I offered to make the referral for her but she wants to keep her appointment with CY on 01/11/20 at 4pm to further discuss results.   Nothing further needed at time of call.

## 2020-01-07 NOTE — Telephone Encounter (Signed)
Her home sleep test showed severe obstructive sleep apnea, averaging 65 apneas/ hour with drops in blood oxygen level. Usually CPAP does best for scores in this range.  I know she was interested in an oral appliance. If she wants to go and learn about this approach, we can refer her to Dr Myrtis Ser.  If she chooses to try CPAP again, then order new DME, new CPAP, mas of choice, auto 5-20, humidifier, supplies, AirView/ card.     Suggest we change her appointment with me on 3/29 for 2 months later, to meet insurance requirements for CPAP follow-up.

## 2020-01-07 NOTE — Telephone Encounter (Signed)
Spoke with patient. She was calling to request the results of her HST from 12/06/19. She was concerned because she has an appointment with CY on 01/11/20 at 4pm and hadn't heard anything.   CY, please advise on her HST results. Thanks!

## 2020-01-11 ENCOUNTER — Ambulatory Visit (INDEPENDENT_AMBULATORY_CARE_PROVIDER_SITE_OTHER): Payer: BC Managed Care – PPO | Admitting: Internal Medicine

## 2020-01-11 ENCOUNTER — Other Ambulatory Visit: Payer: Self-pay

## 2020-01-11 ENCOUNTER — Ambulatory Visit: Payer: BC Managed Care – PPO | Admitting: Internal Medicine

## 2020-01-11 ENCOUNTER — Encounter: Payer: Self-pay | Admitting: Internal Medicine

## 2020-01-11 VITALS — BP 112/62 | HR 67 | Temp 97.0°F | Ht 66.0 in | Wt 228.0 lb

## 2020-01-11 DIAGNOSIS — R0600 Dyspnea, unspecified: Secondary | ICD-10-CM

## 2020-01-11 DIAGNOSIS — U071 COVID-19: Secondary | ICD-10-CM | POA: Diagnosis not present

## 2020-01-11 DIAGNOSIS — G4733 Obstructive sleep apnea (adult) (pediatric): Secondary | ICD-10-CM | POA: Diagnosis not present

## 2020-01-11 NOTE — Progress Notes (Signed)
11/05/19-  58 yoF never smoker for sleep evaluation Medical problem list includes OSA, Restless Legs, Hypothyroid/ Goiter, Atrophic R kidney, Morbid Obesity, Chronic Blood Loss Anemia, Abnormal LFTs, Palpitation, Covid Pneumonia 2020,  NPSG 09/12/15- AHI  75.8/ hr, desaturation to 66%, Body weight 220 lbs,  Has appt with Dr Marchelle Gearing tomorrow for eval post-Covid pneumonia. Body weight today 228 lbs After sleep study ordered by general surgeon in 2016, she tried bronther's CPAP machine w his full face mask. Claustrophobic and didn't continue long. Now aware she snores loudly, witnessed apneas, wakes during night, tired in the day. No complex parasomnias. No ENT surgery. No lung problem prior to Covid but now remains DOE, some cough w white sputum. Palpitations being eval by cardiology.   HRCT chest 10/28/19-  Ordered  By MR for eval post Covid pneumonia IMPRESSION: 1. Mild to moderate patchy ground-glass opacity, reticulation and distortion in both lungs with an upper lung predominance. No significant bronchiectasis or honeycombing. Opacities have improved since 07/20/2019 chest CT. Findings are compatible with postinflammatory fibrosis in a nonspecific interstitial pneumonia (NSIP) pattern. Findings are suggestive of an alternative diagnosis (not UIP) per consensus guidelines: Diagnosis of Idiopathic Pulmonary Fibrosis: An Official ATS/ERS/JRS/ALAT Clinical Practice Guideline. Am Rosezetta Schlatter Crit Care Med Vol 198, Iss 5, [phone], Jun 15 2017. 2. Mild patchy air trapping in both lungs, indicative of small airways disease. 3. Moderate hiatal hernia. 4. Diffuse hepatic steatosis.  01/11/20- 58 yoF never smoker followed for OSA, complicated by  MRestless Legs, Hypothyroid/ Goiter, Atrophic R kidney, Morbid Obesity, Chronic Blood Loss Anemia, Abnormal LFTs, Palpitation, Covid Pneumonia 2020,  HST 12/06/19- AHI 65.8/ hr, desaturation, desaturation to 60%, body weight 225 lbs. She had tried brother's  CPAP after original dx and found it claustrophobic with full-face mask at that time. Never personally fitted. Very much wants to explore alternatives, which we discussed. Resolved Covid pneumonia last year.  ROS-see HPI   + = positive Constitutional:    weight loss, night sweats, fevers, chills, fatigue, lassitude. HEENT:    headaches, +difficulty swallowing, tooth/dental problems, sore throat,       sneezing, itching, ear ache, nasal congestion, post nasal drip, snoring CV:    chest pain, orthopnea, PND, swelling in lower extremities, anasarca,                                  dizziness, palpitations Resp:   shortness of breath with exertion or at rest.                productive cough,   non-productive cough, coughing up of blood.              change in color of mucus.  wheezing.   Skin:    rash or lesions. GI:  No-   heartburn, indigestion, abdominal pain, nausea, vomiting, diarrhea,                 change in bowel habits, loss of appetite GU: dysuria, change in color of urine, no urgency or frequency.   flank pain. MS:   joint pain, stiffness, decreased range of motion, back pain. Neuro-     nothing unusual Psych:  change in mood or affect.  depression or anxiety.   memory loss.  OBJ- Physical Exam General- Alert, Oriented, Affect-appropriate, Distress- none acute, + overweight Skin- rash-none, lesions- none, excoriation- none Lymphadenopathy- none Head- atraumatic  Eyes- Gross vision intact, PERRLA, conjunctivae and secretions clear            Ears- Hearing, canals-normal            Nose- Clear, no-Septal dev, mucus, polyps, erosion, perforation             Throat- Mallampati IV , mucosa clear , drainage- none, tonsils- atrophic Neck- flexible , trachea midline, no stridor , thyroid+ goiter R lobe, carotid no bruit Chest - symmetrical excursion , unlabored           Heart/CV- RRR , no murmur , no gallop  , no rub, nl s1 s2                           - JVD- none , edema-  none, stasis changes- none, varices- none           Lung-  clear to P&A, wheeze- none, cough- none , dullness-none, rub- none           Chest wall-  Abd-  Br/ Gen/ Rectal- Not done, not indicated Extrem- cyanosis- none, clubbing, none, atrophy- none, strength- nl Neuro- grossly intact to observation

## 2020-01-11 NOTE — Patient Instructions (Addendum)
Order- new DME, new CPAP auto 5-20, mask of choice, humidifier, supplies, AirView/ card  Order- referral to Dr Myrtis Ser, orthodontist    Consider oral appliance for OSA  Order   Referral to Dr Jenne Pane, ENT- consider surgical options for OSA  If the oral appliance approach isn't satisfactory, I think there will be ways to help with CPAP.  Please call if we can help

## 2020-01-20 NOTE — Assessment & Plan Note (Signed)
Clinically resolved back to her baseline. Weight loss and increased exercise for endurance are encouraged.

## 2020-01-20 NOTE — Assessment & Plan Note (Signed)
She had very limited experience with CPAP before, and with her high AHI, this will likely be her best option. I did agree to help her explore alternatives, with referral to Dr Myrtis Ser and Dr Jenne Pane to explore oral appliance and ENT options respectively.

## 2020-02-16 DIAGNOSIS — Z9989 Dependence on other enabling machines and devices: Secondary | ICD-10-CM | POA: Diagnosis not present

## 2020-02-16 DIAGNOSIS — J343 Hypertrophy of nasal turbinates: Secondary | ICD-10-CM | POA: Diagnosis not present

## 2020-02-16 DIAGNOSIS — G4733 Obstructive sleep apnea (adult) (pediatric): Secondary | ICD-10-CM | POA: Diagnosis not present

## 2020-02-16 DIAGNOSIS — Z8616 Personal history of COVID-19: Secondary | ICD-10-CM | POA: Diagnosis not present

## 2020-02-17 DIAGNOSIS — G4733 Obstructive sleep apnea (adult) (pediatric): Secondary | ICD-10-CM | POA: Diagnosis not present

## 2020-03-19 DIAGNOSIS — G4733 Obstructive sleep apnea (adult) (pediatric): Secondary | ICD-10-CM | POA: Diagnosis not present

## 2020-04-18 DIAGNOSIS — G4733 Obstructive sleep apnea (adult) (pediatric): Secondary | ICD-10-CM | POA: Diagnosis not present

## 2020-05-19 DIAGNOSIS — G4733 Obstructive sleep apnea (adult) (pediatric): Secondary | ICD-10-CM | POA: Diagnosis not present

## 2020-06-19 DIAGNOSIS — G4733 Obstructive sleep apnea (adult) (pediatric): Secondary | ICD-10-CM | POA: Diagnosis not present

## 2020-07-01 ENCOUNTER — Telehealth: Payer: Self-pay | Admitting: Internal Medicine

## 2020-07-01 NOTE — Telephone Encounter (Signed)
Please advise 

## 2020-07-01 NOTE — Telephone Encounter (Signed)
Ok to order mammogram and  dexa  Dx  Estrogen deficient and  Breast cancer  Screening

## 2020-07-01 NOTE — Telephone Encounter (Signed)
Pt call and want a referral for a bone density and mammogram  And want a call back  At [phone].

## 2020-07-04 ENCOUNTER — Other Ambulatory Visit: Payer: Self-pay

## 2020-07-04 DIAGNOSIS — Z1231 Encounter for screening mammogram for malignant neoplasm of breast: Secondary | ICD-10-CM

## 2020-07-04 DIAGNOSIS — E2839 Other primary ovarian failure: Secondary | ICD-10-CM

## 2020-07-04 NOTE — Telephone Encounter (Signed)
Called patient and left her know that I have ordered the Mammogram and the Dexa and sent to [facility]. Patient verbalized an understanding.

## 2020-07-06 ENCOUNTER — Encounter: Payer: Self-pay | Admitting: Internal Medicine

## 2020-07-06 ENCOUNTER — Other Ambulatory Visit: Payer: Self-pay

## 2020-07-06 ENCOUNTER — Ambulatory Visit (INDEPENDENT_AMBULATORY_CARE_PROVIDER_SITE_OTHER)
Admission: RE | Admit: 2020-07-06 | Discharge: 2020-07-06 | Disposition: A | Discharge status: Home / Self Care | Payer: BC Managed Care – PPO | Source: Ambulatory Visit | Attending: Internal Medicine | Admitting: Internal Medicine

## 2020-07-06 ENCOUNTER — Ambulatory Visit: Payer: BC Managed Care – PPO | Admitting: Internal Medicine

## 2020-07-06 VITALS — BP 120/74 | HR 74 | Temp 98.1°F | Ht 66.0 in | Wt 229.8 lb

## 2020-07-06 DIAGNOSIS — M25551 Pain in right hip: Secondary | ICD-10-CM | POA: Diagnosis not present

## 2020-07-06 DIAGNOSIS — G8929 Other chronic pain: Secondary | ICD-10-CM

## 2020-07-06 DIAGNOSIS — M1611 Unilateral primary osteoarthritis, right hip: Secondary | ICD-10-CM | POA: Diagnosis not present

## 2020-07-06 DIAGNOSIS — M533 Sacrococcygeal disorders, not elsewhere classified: Secondary | ICD-10-CM | POA: Diagnosis not present

## 2020-07-06 NOTE — Patient Instructions (Addendum)
Get hip x ray   Then go from there Take antiinflammatory for 7- 10 days in a row to see effect .  2 aleve  Twice a day  For 7-+ days

## 2020-07-06 NOTE — Progress Notes (Signed)
Chief Complaint  Patient presents with  . Hip Pain    right hip pain, gait is different, pain radiates into back of thigh    HPI: Angelica Gray 59 y.o. come in for   Progressing  Hip pain .  Limping.    Noted at work  Worse with flat  shoes  And weather  .  sitting too long.  Over a year.  Bad with stairs  .   sometimes uncomfortable  .  No injury noted and no rx .  No rx.      ROS: See pertinent positives and negatives per HPI. No feer no radiation  ocass mental fogginess  Residual of covid  Never bothererd her in hospital Past Medical History:  Diagnosis Date  . Arthritis   . Atrophic kidney left  . History of hiatal hernia   . History of renal stone 2004  . Hyperlipidemia   . Hypothyroidism   . Insomnia   . 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 02/01/2015  . RESTLESS LEG SYNDROME, MILD 11/07/2007   Qualifier: Diagnosis of  By: Lovell Sheehan MD, Balinda Quails   . Sleep apnea    no CPAP     Family History  Problem Relation Age of Onset  . Kidney failure Mother        from dm and ht  had 11 children   . Hyperlipidemia Mother   . Diabetes Mother   . Thyroid disease Mother   . Diabetes Father   . Heart attack Father        dec age 51  . Throat cancer Paternal Grandmother        dipped stuff  . Esophageal cancer Paternal Grandmother   . Thyroid disease Sister   . Colon cancer Neg Hx   . Stomach cancer Neg Hx     Social History   Socioeconomic History  . Marital status: Married    Spouse name: Not on file  . Number of children: 4  . Years of education: Not on file  . Highest education level: Not on file  Occupational History  . Occupation: special events planner    Comment: Visual merchandiser  Tobacco Use  . Smoking status: Never Smoker  . Smokeless tobacco: Never Used  Vaping Use  . Vaping Use: Never used  Substance and Sexual Activity  . Alcohol use: No    Alcohol/week: 0.0 standard drinks  . Drug use: No  . Sexual activity: Never    Other Topics Concern  . 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 Difficulty of Paying Living Expenses: Not on file  Food Insecurity:   . Worried About Programme researcher, broadcasting/film/video in the Last Year: Not on file  . Ran Out of Food in the Last Year: Not on file  Transportation Needs:   . Lack of Transportation (Medical): Not on file  . Lack of Transportation (Non-Medical): Not on file  Physical Activity:   . Days of Exercise per Week: Not on file  . Minutes of Exercise per Session: Not on file  Stress:   . Feeling of Stress : Not on file  Social Connections:   .  Frequency of Communication with Friends and Family: Not on file  . Frequency of Social Gatherings with Friends and Family: Not on file  . Attends Religious Services: Not on file  . Active Member of Clubs or Organizations: Not on file  . Attends Banker Meetings: Not on file  . Marital Status: Not on file    Outpatient Medications Prior to Visit  Medication Sig Dispense Refill  . Ascorbic Acid (VITAMIN C) 1000 MG tablet Take by mouth.    . Cholecalciferol (EQL VITAMIN D3) 2000 units CAPS Take 2,000 Units by mouth daily.    . Cyanocobalamin (VITAMIN B 12 PO) Take by mouth.    . levothyroxine (SYNTHROID, LEVOTHROID) 112 MCG tablet Take 1 tablet by mouth once daily 90 tablet 0   No facility-administered medications prior to visit.     EXAM:  BP 120/74   Pulse 74   Temp 98.1 F (36.7 C) (Oral)   Ht 5\' 6"  (1.676 m)   Wt 229 lb 12.8 oz (104.2 kg)   SpO2 98%   BMI 37.09 kg/m   Body mass index is 37.09 kg/m.  GENERAL: vitals reviewed and listed above, alert, oriented, appears well hydrated and in no acute distress HEENT: atraumatic, conjunctiva  clear, no obvious abnormalities on inspection of external nose and  ears OP : masked  NECK: no obvious masses on inspection palpation   MS: moves all extremities without noticeable focal  Abnormality are of   Pain is right lateral lower buttocks area  No mass no midline pain no groind pain  Walking has a mild limp gait  No bony tendernss  PSYCH: pleasant and cooperative, no obvious depression or anxiety Lab Results  Component Value Date   WBC 4.8 08/17/2019   HGB 14.0 08/17/2019   HCT 41.8 08/17/2019   PLT 191.0 08/17/2019   GLUCOSE 124 (H) 08/17/2019   CHOL 165 08/17/2019   TRIG 113.0 08/17/2019   HDL 37.70 (L) 08/17/2019   LDLDIRECT 122.0 02/15/2010   LDLCALC 105 (H) 08/17/2019   ALT 30 08/17/2019   ALT 30 08/17/2019   AST 21 08/17/2019   AST 21 08/17/2019   NA 139 08/17/2019   K 4.3 08/17/2019   CL 105 08/17/2019   CREATININE 0.86 08/17/2019   BUN 10 08/17/2019   CO2 27 08/17/2019   TSH 2.192 11/06/2019   INR 1.02 04/18/2010   HGBA1C 6.9 (H) 08/17/2019   BP Readings from Last 3 Encounters:  07/06/20 120/74  01/11/20 112/62  11/06/19 116/76    ASSESSMENT AND PLAN:  Discussed the following assessment and plan:  Chronic right hip pain - Plan: DG Hip Unilat W OR W/O Pelvis 2-3 Views Right Location hip buttock area  And local   Poss bursitis  Or pyriformis but   There for a year off and on  And worse with staris weight bearing . Short trial nsaid for 7- 10 days  X ray  Hip consdier sm or ortho  Wasn't to avoid specialty cost unless absolutely necessary .   -Patient advised to return or notify health care team  if  new concerns arise.  Patient Instructions  Get hip x ray   Then go from there Take antiinflammatory for 7- 10 days in a row to see effect .  2 aleve  Twice a day  For 7-+ days        11/08/19. Kyla Duffy M.D.

## 2020-07-07 NOTE — Progress Notes (Signed)
Mild arthritis  no acute findings . If  persistent or progressive  after  short term  aleve we can do referral to  sports medicine. Let us know if  you want [place] to refer

## 2020-07-19 DIAGNOSIS — G4733 Obstructive sleep apnea (adult) (pediatric): Secondary | ICD-10-CM | POA: Diagnosis not present

## 2020-07-22 ENCOUNTER — Other Ambulatory Visit: Payer: Self-pay

## 2020-07-22 ENCOUNTER — Ambulatory Visit
Admission: RE | Admit: 2020-07-22 | Discharge: 2020-07-22 | Disposition: A | Discharge status: Home / Self Care | Payer: BC Managed Care – PPO | Source: Ambulatory Visit | Attending: Internal Medicine | Admitting: Internal Medicine

## 2020-07-22 DIAGNOSIS — Z1231 Encounter for screening mammogram for malignant neoplasm of breast: Secondary | ICD-10-CM | POA: Diagnosis not present

## 2020-08-11 ENCOUNTER — Other Ambulatory Visit: Payer: Self-pay | Admitting: Internal Medicine

## 2020-08-12 ENCOUNTER — Ambulatory Visit (INDEPENDENT_AMBULATORY_CARE_PROVIDER_SITE_OTHER): Payer: BC Managed Care – PPO | Admitting: Internal Medicine

## 2020-08-12 ENCOUNTER — Encounter: Payer: Self-pay | Admitting: Internal Medicine

## 2020-08-12 ENCOUNTER — Other Ambulatory Visit: Payer: Self-pay

## 2020-08-12 VITALS — BP 122/82 | HR 68 | Temp 98.0°F | Ht 66.0 in | Wt 228.0 lb

## 2020-08-12 DIAGNOSIS — Z0000 Encounter for general adult medical examination without abnormal findings: Secondary | ICD-10-CM

## 2020-08-12 DIAGNOSIS — G4733 Obstructive sleep apnea (adult) (pediatric): Secondary | ICD-10-CM

## 2020-08-12 DIAGNOSIS — Z8616 Personal history of COVID-19: Secondary | ICD-10-CM

## 2020-08-12 DIAGNOSIS — E079 Disorder of thyroid, unspecified: Secondary | ICD-10-CM

## 2020-08-12 DIAGNOSIS — S8001XA Contusion of right knee, initial encounter: Secondary | ICD-10-CM

## 2020-08-12 DIAGNOSIS — R739 Hyperglycemia, unspecified: Secondary | ICD-10-CM | POA: Diagnosis not present

## 2020-08-12 DIAGNOSIS — Z79899 Other long term (current) drug therapy: Secondary | ICD-10-CM

## 2020-08-12 DIAGNOSIS — G2581 Restless legs syndrome: Secondary | ICD-10-CM

## 2020-08-12 DIAGNOSIS — Z23 Encounter for immunization: Secondary | ICD-10-CM

## 2020-08-12 NOTE — Progress Notes (Signed)
Chief Complaint  Patient presents with  . Annual Exam  . Medication Management    HPI: Patient  Angelica Gray  58 y.o. comes in today for Preventive Health Care visit  And Chronic disease management Bp ok RESP : much better little residual form covid  OSA using cpap for the past 6 mos mor effectively  Vision blurry not diplopia  Hx of floters in past Right hip issues   Bothers at times take aleve as needed with some help  Ran into deck at knee level with mower  ( attention) hurt badly a week ago .Marland Kitchen Has bruise but can walk around Thyroid taking meds daily  Needs  Documentation of flu vaccine    Health Maintenance  Topic Date Due  . HIV Screening  Never done  . PAP SMEAR-Modifier  11/04/2020  . MAMMOGRAM  07/22/2022  . COLONOSCOPY  01/31/2025  . TETANUS/TDAP  08/12/2030  . INFLUENZA VACCINE  Completed  . COVID-19 Vaccine  Completed  . Hepatitis C Screening  Completed   Health Maintenance Review LIFESTYLE:  Exercise:  Normal activeiy no dedicated  Tobacco/ETS: Alcohol: n Sugar beverages: Sleep:6-8 cpap Drug use: no HH of 3 inside cat  Work: now on site FT     ROS:  See hpi   Gets burning in legs at night sometimes   Feels rls  GEN/ HEENT: No fever, significant weight changes sweats headaches vision problems hearing changes, CV/ PULM; No chest pain shortness of breath cough, syncope,edema  change in exercise tolerance. GI /GU: No adominal pain, vomiting, change in bowel habits. No blood in the stool. No significant GU symptoms. SKIN/HEME: ,no acute skin rashes suspicious lesions or bleeding. No lymphadenopathy, nodules, masses.  NEURO/ PSYCH:  No neurologic signs such as weakness numbness. No depression anxiety. IMM/ Allergy: No unusual infections.  Allergy .   REST of 12 system review negative except as per HPI   Past Medical History:  Diagnosis Date  . Arthritis   . Atrophic kidney left  . History of hiatal hernia   . History of renal stone 2004  .  Hyperlipidemia   . Hypothyroidism   . Insomnia   . 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 02/01/2015  . RESTLESS LEG SYNDROME, MILD 11/07/2007   Qualifier: Diagnosis of  By: Lovell Sheehan MD, Balinda Quails   . Sleep apnea    no CPAP     Past Surgical History:  Procedure Laterality Date  . COLONOSCOPY    . ESOPHAGOGASTRODUODENOSCOPY    . HIATAL HERNIA REPAIR N/A 09/05/2015   Procedure: LAPAROSCOPIC REPAIR OF HIATAL HERNIA;  Surgeon: Ovidio Kin, MD;  Location: [facility];  Service: General;  Laterality: N/A;  . KIDNEY STONE SURGERY    . MANDIBLE FRACTURE SURGERY    . REFRACTIVE SURGERY    . TUBAL LIGATION  2001    Family History  Problem Relation Age of Onset  . Kidney failure Mother        from dm and ht  had 11 children   . Hyperlipidemia Mother   . Diabetes Mother   . Thyroid disease Mother   . Diabetes Father   . Heart attack Father        dec age 66  . Throat cancer Paternal Grandmother        dipped stuff  . Esophageal cancer Paternal Grandmother   . Thyroid disease Sister   . Colon cancer Neg Hx   . Stomach cancer Neg  Hx     Social History   Socioeconomic History  . Marital status: Married    Spouse name: Not on file  . Number of children: 4  . Years of education: Not on file  . Highest education level: Not on file  Occupational History  . Occupation: special events planner    Comment: Visual merchandiser  Tobacco Use  . Smoking status: Never Smoker  . Smokeless tobacco: Never Used  Vaping Use  . Vaping Use: Never used  Substance and Sexual Activity  . Alcohol use: No    Alcohol/week: 0.0 standard drinks  . Drug use: No  . Sexual activity: Never  Other Topics Concern  . 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 Difficulty of Paying Living  Expenses: Not on file  Food Insecurity:   . Worried About Programme researcher, broadcasting/film/video in the Last Year: Not on file  . Ran Out of Food in the Last Year: Not on file  Transportation Needs:   . Lack of Transportation (Medical): Not on file  . Lack of Transportation (Non-Medical): Not on file  Physical Activity:   . Days of Exercise per Week: Not on file  . Minutes of Exercise per Session: Not on file  Stress:   . Feeling of Stress : Not on file  Social Connections:   . Frequency of Communication with Friends and Family: Not on file  . Frequency of Social Gatherings with Friends and Family: Not on file  . Attends Religious Services: Not on file  . Active Member of Clubs or Organizations: Not on file  . Attends Banker Meetings: Not on file  . Marital Status: Not on file    Outpatient Medications Prior to Visit  Medication Sig Dispense Refill  . Ascorbic Acid (VITAMIN C) 1000 MG tablet Take by mouth.    . Cholecalciferol (EQL VITAMIN D3) 2000 units CAPS Take 2,000 Units by mouth daily.    . Cyanocobalamin (VITAMIN B 12 PO) Take by mouth.    . levothyroxine (SYNTHROID) 112 MCG tablet Take 1 tablet by mouth once daily 90 tablet 0   No facility-administered medications prior to visit.     EXAM:  BP 122/82 (BP Location: Right Arm, Patient Position: Sitting, Cuff Size: Normal)   Pulse 68   Temp 98 F (36.7 C) (Oral)   Ht  (1.676 m)   Wt 228 lb (103.4 kg)   SpO2 98%   BMI 36.80 kg/m   Body mass index is 36.8 kg/m. Wt Readings from Last 3 Encounters:  08/12/20 228 lb (103.4 kg)  07/06/20 229 lb 12.8 oz (104.2 kg)  01/11/20 228 lb (103.4 kg)    Physical Exam: Vital signs reviewed ZOX:WRUE is a well-developed well-nourished alert cooperative    who appearsr stated age in no acute distress.  HEENT: normocephalic atraumatic , Eyes: PERRL EOM's full, conjunctiva clear, Nares: paten,t no deformity discharge or tenderness., Ears: no deformity EAC's clear TMs with normal  landmarks. Mouth:masked  NECK: supple without masses, thyromegaly or bruits. CHEST/PULM:  Clear to auscultation and percussion breath sounds equal no wheeze , rales or rhonchi. No chest wall deformities or tenderness. Breast: normal by inspection . No  dimpling, discharge, masses, tenderness or discharge . CV: PMI is nondisplaced, S1 S2 no gallops, murmurs, rubs. Peripheral pulses are full without delay.No JVD .  ABDOMEN: Bowel sounds normal nontender  No guard or rebound, no hepato splenomegal no CVA tenderness.  . Extremtities:  No clubbing cyanosis or edema, no acute joint swelling or redness no focal atrophy mild stiffness right hip when upfrom sitting   Right knee tibial medial area with  4-5 cm contusion  Minimal deformity and nl rom of knee NEURO:  Oriented x3, cranial nerves 3-12 appear to be intact, no obvious focal weakness,gait within normal limits no abnormal reflexes or asymmetrical SKIN: No acute rashes normal turgor, color, no bruising or petechiae. PSYCH: Oriented, good eye contact, no obvious depression anxiety, cognition and judgment appear normal. LN: no cervical axillary inguinal adenopathy  Lab Results  Component Value Date   WBC 4.8 08/17/2019   HGB 14.0 08/17/2019   HCT 41.8 08/17/2019   PLT 191.0 08/17/2019   GLUCOSE 124 (H) 08/17/2019   CHOL 165 08/17/2019   TRIG 113.0 08/17/2019   HDL 37.70 (L) 08/17/2019   LDLDIRECT 122.0 02/15/2010   LDLCALC 105 (H) 08/17/2019   ALT 30 08/17/2019   ALT 30 08/17/2019   AST 21 08/17/2019   AST 21 08/17/2019   NA 139 08/17/2019   K 4.3 08/17/2019   CL 105 08/17/2019   CREATININE 0.86 08/17/2019   BUN 10 08/17/2019   CO2 27 08/17/2019   TSH 2.192 11/06/2019   INR 1.02 04/18/2010   HGBA1C 6.9 (H) 08/17/2019    BP Readings from Last 3 Encounters:  08/12/20 122/82  07/06/20 120/74  01/11/20 112/62    Labplan fasting  reviewed with patient   ASSESSMENT AND PLAN:  Discussed the following assessment and plan:     ICD-10-CM   1. Visit for preventive health examination  Z00.00 CBC with Differential/Platelet    BASIC METABOLIC PANEL WITH GFR    Lipid panel    Hepatic function panel    TSH    Hemoglobin A1c    Iron, TIBC and Ferritin Panel    Iron, TIBC and Ferritin Panel    Hemoglobin A1c    TSH    Hepatic function panel    Lipid panel    BASIC METABOLIC PANEL WITH GFR    CBC with Differential/Platelet  2. Hyperglycemia  R73.9 CBC with Differential/Platelet    BASIC METABOLIC PANEL WITH GFR    Lipid panel    Hepatic function panel    TSH    Hemoglobin A1c    Iron, TIBC and Ferritin Panel    Iron, TIBC and Ferritin Panel    Hemoglobin A1c    TSH    Hepatic function panel    Lipid panel    BASIC METABOLIC PANEL WITH GFR    CBC with Differential/Platelet  3. Thyroid disorder  E07.9 CBC with Differential/Platelet    BASIC METABOLIC PANEL WITH GFR    Lipid panel    Hepatic function panel    TSH    Hemoglobin A1c    Iron, TIBC and Ferritin Panel    Iron, TIBC and Ferritin Panel    Hemoglobin A1c    TSH    Hepatic function panel    Lipid panel    BASIC METABOLIC PANEL WITH GFR    CBC with Differential/Platelet  4. OSA (obstructive sleep apnea)  G47.33 CBC with Differential/Platelet    BASIC METABOLIC PANEL WITH GFR    Lipid panel  Hepatic function panel    TSH    Hemoglobin A1c    Iron, TIBC and Ferritin Panel    Iron, TIBC and Ferritin Panel    Hemoglobin A1c    TSH    Hepatic function panel    Lipid panel    BASIC METABOLIC PANEL WITH GFR    CBC with Differential/Platelet  5. Medication management  Z79.899 CBC with Differential/Platelet    BASIC METABOLIC PANEL WITH GFR    Lipid panel    Hepatic function panel    TSH    Hemoglobin A1c    Iron, TIBC and Ferritin Panel    Iron, TIBC and Ferritin Panel    Hemoglobin A1c    TSH    Hepatic function panel    Lipid panel    BASIC METABOLIC PANEL WITH GFR    CBC with Differential/Platelet  6. RLS (restless legs  syndrome)  G25.81 CBC with Differential/Platelet    BASIC METABOLIC PANEL WITH GFR    Lipid panel    Hepatic function panel    TSH    Hemoglobin A1c    Iron, TIBC and Ferritin Panel    Iron, TIBC and Ferritin Panel    Hemoglobin A1c    TSH    Hepatic function panel    Lipid panel    BASIC METABOLIC PANEL WITH GFR    CBC with Differential/Platelet  7. Contusion of right knee, initial encounter  S80.01XA CBC with Differential/Platelet    BASIC METABOLIC PANEL WITH GFR    Lipid panel    Hepatic function panel    TSH    Hemoglobin A1c    Iron, TIBC and Ferritin Panel    Iron, TIBC and Ferritin Panel    Hemoglobin A1c    TSH    Hepatic function panel    Lipid panel    BASIC METABOLIC PANEL WITH GFR    CBC with Differential/Platelet  8. Need for Tdap vaccination  Z23 Tdap vaccine greater than or equal to 7yo IM  9. Need for influenza vaccination  Z23 Flu Vaccine QUAD 36+ mos IM  10. History of 2019 novel coronavirus disease (COVID-19)  Z86.16     Pap due  In 3-5 years from jan 21 had neg   hpv and no hx of problem or sx  Counseled. And local care  Await  Labs  Return for depending on results at least yearlly .  Patient Care Team: Iretta Mangrum, Neta Mends, MD as PCP - General (Internal Medicine) Rollene Rotunda, MD as PCP - Cardiology (Cardiology) Iva Boop, MD as Consulting Physician (Gastroenterology) Patient Instructions  Get eye exam about the blurred visiting Will notify you  of labs when available. Fasting and fu depending. Checking blood sugar  To check for diabetes  which could cause eye blurriness and  Leg burning RLS    Continue  cpap as you are doing.  Pap  Done Jan 2019 and  Is good for up to 5 years  Since normal and hpv negative .   So not due .    Health Maintenance, Female Adopting a healthy lifestyle and getting preventive care are important in promoting health and wellness. Ask your health care provider about:  The right schedule for you to have regular  tests and exams.  Things you can do on your own to prevent diseases and keep yourself healthy. What should I know about diet, weight, and exercise? Eat a healthy diet   Eat a diet that includes  plenty of vegetables, fruits, low-fat dairy products, and lean protein.  Do not eat a lot of foods that are high in solid fats, added sugars, or sodium. Maintain a healthy weight Body mass index (BMI) is used to identify weight problems. It estimates body fat based on height and weight. Your health care provider can help determine your BMI and help you achieve or maintain a healthy weight. Get regular exercise Get regular exercise. This is one of the most important things you can do for your health. Most adults should:  Exercise for at least 150 minutes each week. The exercise should increase your heart rate and make you sweat (moderate-intensity exercise).  Do strengthening exercises at least twice a week. This is in addition to the moderate-intensity exercise.  Spend less time sitting. Even light physical activity can be beneficial. Watch cholesterol and blood lipids Have your blood tested for lipids and cholesterol at 59 years of age, then have this test every 5 years. Have your cholesterol levels checked more often if:  Your lipid or cholesterol levels are high.  You are older than 59 years of age.  You are at high risk for heart disease. What should I know about cancer screening? Depending on your health history and family history, you may need to have cancer screening at various ages. This may include screening for:  Breast cancer.  Cervical cancer.  Colorectal cancer.  Skin cancer.  Lung cancer. What should I know about heart disease, diabetes, and high blood pressure? Blood pressure and heart disease  High blood pressure causes heart disease and increases the risk of stroke. This is more likely to develop in people who have high blood pressure readings, are of African  descent, or are overweight.  Have your blood pressure checked: ? Every 3-5 years if you are 74-73 years of age. ? Every year if you are 14 years old or older. Diabetes Have regular diabetes screenings. This checks your fasting blood sugar level. Have the screening done:  Once every three years after age 47 if you are at a normal weight and have a low risk for diabetes.  More often and at a younger age if you are overweight or have a high risk for diabetes. What should I know about preventing infection? Hepatitis B If you have a higher risk for hepatitis B, you should be screened for this virus. Talk with your health care provider to find out if you are at risk for hepatitis B infection. Hepatitis C Testing is recommended for:  Everyone born from [birth year] through 1965.  Anyone with known risk factors for hepatitis C. Sexually transmitted infections (STIs)  Get screened for STIs, including gonorrhea and chlamydia, if: ? You are sexually active and are younger than 59 years of age. ? You are older than 59 years of age and your health care provider tells you that you are at risk for this type of infection. ? Your sexual activity has changed since you were last screened, and you are at increased risk for chlamydia or gonorrhea. Ask your health care provider if you are at risk.  Ask your health care provider about whether you are at high risk for HIV. Your health care provider may recommend a prescription medicine to help prevent HIV infection. If you choose to take medicine to prevent HIV, you should first get tested for HIV. You should then be tested every 3 months for as long as you are taking the medicine. Pregnancy  If you are  about to stop having your period (premenopausal) and you may become pregnant, seek counseling before you get pregnant.  Take 400 to 800 micrograms (mcg) of folic acid every day if you become pregnant.  Ask for birth control (contraception) if you want to prevent  pregnancy. Osteoporosis and menopause Osteoporosis is a disease in which the bones lose minerals and strength with aging. This can result in bone fractures. If you are 59 years old or older, or if you are at risk for osteoporosis and fractures, ask your health care provider if you should:  Be screened for bone loss.  Take a calcium or vitamin D supplement to lower your risk of fractures.  Be given hormone replacement therapy (HRT) to treat symptoms of menopause. Follow these instructions at home: Lifestyle  Do not use any products that contain nicotine or tobacco, such as cigarettes, e-cigarettes, and chewing tobacco. If you need help quitting, ask your health care provider.  Do not use street drugs.  Do not share needles.  Ask your health care provider for help if you need support or information about quitting drugs. Alcohol use  Do not drink alcohol if: ? Your health care provider tells you not to drink. ? You are pregnant, may be pregnant, or are planning to become pregnant.  If you drink alcohol: ? Limit how much you use to 0-1 drink a day. ? Limit intake if you are breastfeeding.  Be aware of how much alcohol is in your drink. In the [place] one drink equals one 12 oz bottle of beer (355 mL), one 5 oz glass of wine (148 mL), or one 1 oz glass of hard liquor (44 mL). General instructions  Schedule regular health, dental, and eye exams.  Stay current with your vaccines.  Tell your health care provider if: ? You often feel depressed. ? You have ever been abused or do not feel safe at home. Summary  Adopting a healthy lifestyle and getting preventive care are important in promoting health and wellness.  Follow your health care provider's instructions about healthy diet, exercising, and getting tested or screened for diseases.  Follow your health care provider's instructions on monitoring your cholesterol and blood pressure. This information is not intended to replace  advice given to you by your health care provider. Make sure you discuss any questions you have with your health care provider. Document Revised: 09/24/2018 Document Reviewed: 09/24/2018 Elsevier Patient Education  2020 [employer]         [place]anda K. Quantavious Eggert M.D.

## 2020-08-12 NOTE — Patient Instructions (Addendum)
Get eye exam about the blurred visiting Will notify you  of labs when available. Fasting and fu depending. Checking blood sugar  To check for diabetes  which could cause eye blurriness and  Leg burning RLS    Continue  cpap as you are doing.  Pap  Done Jan 2019 and  Is good for up to 5 years  Since normal and hpv negative .   So not due .    Health Maintenance, Female Adopting a healthy lifestyle and getting preventive care are important in promoting health and wellness. Ask your health care provider about:  The right schedule for you to have regular tests and exams.  Things you can do on your own to prevent diseases and keep yourself healthy. What should I know about diet, weight, and exercise? Eat a healthy diet   Eat a diet that includes plenty of vegetables, fruits, low-fat dairy products, and lean protein.  Do not eat a lot of foods that are high in solid fats, added sugars, or sodium. Maintain a healthy weight Body mass index (BMI) is used to identify weight problems. It estimates body fat based on height and weight. Your health care provider can help determine your BMI and help you achieve or maintain a healthy weight. Get regular exercise Get regular exercise. This is one of the most important things you can do for your health. Most adults should:  Exercise for at least 150 minutes each week. The exercise should increase your heart rate and make you sweat (moderate-intensity exercise).  Do strengthening exercises at least twice a week. This is in addition to the moderate-intensity exercise.  Spend less time sitting. Even light physical activity can be beneficial. Watch cholesterol and blood lipids Have your blood tested for lipids and cholesterol at 59 years of age, then have this test every 5 years. Have your cholesterol levels checked more often if:  Your lipid or cholesterol levels are high.  You are older than 59 years of age.  You are at high risk for heart  disease. What should I know about cancer screening? Depending on your health history and family history, you may need to have cancer screening at various ages. This may include screening for:  Breast cancer.  Cervical cancer.  Colorectal cancer.  Skin cancer.  Lung cancer. What should I know about heart disease, diabetes, and high blood pressure? Blood pressure and heart disease  High blood pressure causes heart disease and increases the risk of stroke. This is more likely to develop in people who have high blood pressure readings, are of African descent, or are overweight.  Have your blood pressure checked: ? Every 3-5 years if you are 59-59 years of age. ? Every year if you are 59 years old or older. Diabetes Have regular diabetes screenings. This checks your fasting blood sugar level. Have the screening done:  Once every three years after age 85 if you are at a normal weight and have a low risk for diabetes.  More often and at a younger age if you are overweight or have a high risk for diabetes. What should I know about preventing infection? Hepatitis B If you have a higher risk for hepatitis B, you should be screened for this virus. Talk with your health care provider to find out if you are at risk for hepatitis B infection. Hepatitis C Testing is recommended for:  Everyone born from [birth year] through 1965.  Anyone with known risk factors for hepatitis C. Sexually  transmitted infections (STIs)  Get screened for STIs, including gonorrhea and chlamydia, if: ? You are sexually active and are younger than 59 years of age. ? You are older than 59 years of age and your health care provider tells you that you are at risk for this type of infection. ? Your sexual activity has changed since you were last screened, and you are at increased risk for chlamydia or gonorrhea. Ask your health care provider if you are at risk.  Ask your health care provider about whether you are at high  risk for HIV. Your health care provider may recommend a prescription medicine to help prevent HIV infection. If you choose to take medicine to prevent HIV, you should first get tested for HIV. You should then be tested every 3 months for as long as you are taking the medicine. Pregnancy  If you are about to stop having your period (premenopausal) and you may become pregnant, seek counseling before you get pregnant.  Take 400 to 800 micrograms (mcg) of folic acid every day if you become pregnant.  Ask for birth control (contraception) if you want to prevent pregnancy. Osteoporosis and menopause Osteoporosis is a disease in which the bones lose minerals and strength with aging. This can result in bone fractures. If you are 80 years old or older, or if you are at risk for osteoporosis and fractures, ask your health care provider if you should:  Be screened for bone loss.  Take a calcium or vitamin D supplement to lower your risk of fractures.  Be given hormone replacement therapy (HRT) to treat symptoms of menopause. Follow these instructions at home: Lifestyle  Do not use any products that contain nicotine or tobacco, such as cigarettes, e-cigarettes, and chewing tobacco. If you need help quitting, ask your health care provider.  Do not use street drugs.  Do not share needles.  Ask your health care provider for help if you need support or information about quitting drugs. Alcohol use  Do not drink alcohol if: ? Your health care provider tells you not to drink. ? You are pregnant, may be pregnant, or are planning to become pregnant.  If you drink alcohol: ? Limit how much you use to 0-1 drink a day. ? Limit intake if you are breastfeeding.  Be aware of how much alcohol is in your drink. In the [place] one drink equals one 12 oz bottle of beer (355 mL), one 5 oz glass of wine (148 mL), or one 1 oz glass of hard liquor (44 mL). General instructions  Schedule regular health, dental,  and eye exams.  Stay current with your vaccines.  Tell your health care provider if: ? You often feel depressed. ? You have ever been abused or do not feel safe at home. Summary  Adopting a healthy lifestyle and getting preventive care are important in promoting health and wellness.  Follow your health care provider's instructions about healthy diet, exercising, and getting tested or screened for diseases.  Follow your health care provider's instructions on monitoring your cholesterol and blood pressure. This information is not intended to replace advice given to you by your health care provider. Make sure you discuss any questions you have with your health care provider. Document Revised: 09/24/2018 Document Reviewed: 09/24/2018 Elsevier Patient Education  2020 [employer].

## 2020-08-13 LAB — CBC WITH DIFFERENTIAL/PLATELET
Absolute Monocytes: 392 cells/uL (ref 200–950)
Basophils Absolute: 20 cells/uL (ref 0–200)
Basophils Relative: 0.4 %
Eosinophils Absolute: 108 cells/uL (ref 15–500)
Eosinophils Relative: 2.2 %
HCT: 41.6 % (ref 35.0–45.0)
Hemoglobin: 13.8 g/dL (ref 11.7–15.5)
Lymphs Abs: 1519 cells/uL (ref 850–3900)
MCH: 30 pg (ref 27.0–33.0)
MCHC: 33.2 g/dL (ref 32.0–36.0)
MCV: 90.4 fL (ref 80.0–100.0)
MPV: 11.2 fL (ref 7.5–12.5)
Monocytes Relative: 8 %
Neutro Abs: 2862 cells/uL (ref 1500–7800)
Neutrophils Relative %: 58.4 %
Platelets: 184 10*3/uL (ref 140–400)
RBC: 4.6 10*6/uL (ref 3.80–5.10)
RDW: 12.7 % (ref 11.0–15.0)
Total Lymphocyte: 31 %
WBC: 4.9 10*3/uL (ref 3.8–10.8)

## 2020-08-13 LAB — HEPATIC FUNCTION PANEL
AG Ratio: 1.5 (calc) (ref 1.0–2.5)
ALT: 24 U/L (ref 6–29)
AST: 19 U/L (ref 10–35)
Albumin: 4.4 g/dL (ref 3.6–5.1)
Alkaline phosphatase (APISO): 146 U/L (ref 37–153)
Bilirubin, Direct: 0.1 mg/dL (ref 0.0–0.2)
Globulin: 2.9 g/dL (calc) (ref 1.9–3.7)
Indirect Bilirubin: 0.6 mg/dL (calc) (ref 0.2–1.2)
Total Bilirubin: 0.7 mg/dL (ref 0.2–1.2)
Total Protein: 7.3 g/dL (ref 6.1–8.1)

## 2020-08-13 LAB — BASIC METABOLIC PANEL WITH GFR
BUN: 15 mg/dL (ref 7–25)
CO2: 28 mmol/L (ref 20–32)
Calcium: 9.5 mg/dL (ref 8.6–10.4)
Chloride: 105 mmol/L (ref 98–110)
Creat: 1 mg/dL (ref 0.50–1.05)
GFR, Est African American: 72 mL/min/{1.73_m2} (ref >=60)
GFR, Est Non African American: 62 mL/min/{1.73_m2} (ref >=60)
Glucose, Bld: 112 mg/dL — ABNORMAL HIGH (ref 65–99)
Potassium: 4.4 mmol/L (ref 3.5–5.3)
Sodium: 138 mmol/L (ref 135–146)

## 2020-08-13 LAB — LIPID PANEL
Cholesterol: 172 mg/dL (ref <200)
HDL: 56 mg/dL (ref >=50)
LDL Cholesterol (Calc): 99 mg/dL (calc)
Non-HDL Cholesterol (Calc): 116 mg/dL (calc) (ref <130)
Total CHOL/HDL Ratio: 3.1 (calc) (ref <5.0)
Triglycerides: 84 mg/dL (ref <150)

## 2020-08-13 LAB — IRON,TIBC AND FERRITIN PANEL
%SAT: 19 % (calc) (ref 16–45)
Ferritin: 63 ng/mL (ref 16–232)
Iron: 76 ug/dL (ref 45–160)
TIBC: 391 mcg/dL (calc) (ref 250–450)

## 2020-08-13 LAB — HEMOGLOBIN A1C
Hgb A1c MFr Bld: 6.4 % of total Hgb — ABNORMAL HIGH (ref <5.7)
Mean Plasma Glucose: 137 (calc)
eAG (mmol/L): 7.6 (calc)

## 2020-08-13 LAB — TSH: TSH: 1.63 mIU/L (ref 0.40–4.50)

## 2020-08-18 NOTE — Progress Notes (Signed)
Blood sugar in the pre borderline diabetic range  ( although better than last check)  rest of labs stable or in range . I advise  adding metformin for blood sugar help( doesn't  add to weight and may prevent blood sugar rise ove time )    If agrees please send in  Metformrin 500 mg   take 1 po qd     disp 90 refill x 1  Arrange Rov in 4 -6 months to check hg a1c at visit either way

## 2020-08-19 DIAGNOSIS — G4733 Obstructive sleep apnea (adult) (pediatric): Secondary | ICD-10-CM | POA: Diagnosis not present

## 2020-09-13 DIAGNOSIS — G4733 Obstructive sleep apnea (adult) (pediatric): Secondary | ICD-10-CM | POA: Diagnosis not present

## 2020-09-28 DIAGNOSIS — G4733 Obstructive sleep apnea (adult) (pediatric): Secondary | ICD-10-CM | POA: Diagnosis not present

## 2020-10-20 ENCOUNTER — Ambulatory Visit
Admission: RE | Admit: 2020-10-20 | Discharge: 2020-10-20 | Disposition: A | Discharge status: Home / Self Care | Payer: BC Managed Care – PPO | Source: Ambulatory Visit | Attending: Internal Medicine | Admitting: Internal Medicine

## 2020-10-20 ENCOUNTER — Other Ambulatory Visit: Payer: Self-pay

## 2020-10-20 DIAGNOSIS — Z780 Asymptomatic menopausal state: Secondary | ICD-10-CM | POA: Diagnosis not present

## 2020-10-20 DIAGNOSIS — M8589 Other specified disorders of bone density and structure, multiple sites: Secondary | ICD-10-CM | POA: Diagnosis not present

## 2020-10-20 DIAGNOSIS — E2839 Other primary ovarian failure: Secondary | ICD-10-CM

## 2020-10-21 NOTE — Progress Notes (Signed)
Osteopenia  dec bone density but not osteoporosis   make sure vit d 1000 iu per day  and weight bearing exercise  Plan repeat  dexa in 2-3 years

## 2020-10-21 NOTE — Progress Notes (Signed)
Mychart message sent: Osteopenia dec bone density but not osteoporosis  make sure vit d 1000 iu per day and weight bearing exercise  Plan repeat dexa in 2-3 years

## 2020-10-30 ENCOUNTER — Other Ambulatory Visit: Payer: Self-pay | Admitting: Internal Medicine

## 2021-01-08 DIAGNOSIS — K5790 Diverticulosis of intestine, part unspecified, without perforation or abscess without bleeding: Secondary | ICD-10-CM | POA: Diagnosis not present

## 2021-01-08 DIAGNOSIS — I10 Essential (primary) hypertension: Secondary | ICD-10-CM | POA: Diagnosis not present

## 2021-01-08 DIAGNOSIS — M479 Spondylosis, unspecified: Secondary | ICD-10-CM | POA: Diagnosis not present

## 2021-01-08 DIAGNOSIS — K449 Diaphragmatic hernia without obstruction or gangrene: Secondary | ICD-10-CM | POA: Diagnosis not present

## 2021-01-08 DIAGNOSIS — M545 Low back pain, unspecified: Secondary | ICD-10-CM | POA: Diagnosis not present

## 2021-01-08 DIAGNOSIS — R609 Edema, unspecified: Secondary | ICD-10-CM | POA: Diagnosis not present

## 2021-01-08 DIAGNOSIS — M549 Dorsalgia, unspecified: Secondary | ICD-10-CM | POA: Diagnosis not present

## 2021-01-09 DIAGNOSIS — R9431 Abnormal electrocardiogram [ECG] [EKG]: Secondary | ICD-10-CM | POA: Diagnosis not present

## 2021-01-18 ENCOUNTER — Other Ambulatory Visit: Payer: Self-pay | Admitting: Internal Medicine

## 2021-03-07 ENCOUNTER — Other Ambulatory Visit: Payer: Self-pay

## 2021-03-08 ENCOUNTER — Ambulatory Visit: Payer: BC Managed Care – PPO | Admitting: Family Medicine

## 2021-03-08 ENCOUNTER — Encounter: Payer: Self-pay | Admitting: Family Medicine

## 2021-03-08 VITALS — BP 126/80 | HR 62 | Temp 97.8°F | Wt 221.0 lb

## 2021-03-08 DIAGNOSIS — N644 Mastodynia: Secondary | ICD-10-CM | POA: Diagnosis not present

## 2021-03-08 DIAGNOSIS — S76011D Strain of muscle, fascia and tendon of right hip, subsequent encounter: Secondary | ICD-10-CM

## 2021-03-08 DIAGNOSIS — S161XXD Strain of muscle, fascia and tendon at neck level, subsequent encounter: Secondary | ICD-10-CM

## 2021-03-08 MED ORDER — NAPROXEN 500 MG PO TABS: 500.0000 mg | ORAL_TABLET | Freq: Two times a day (BID) | ORAL | 0 refills | Status: DC
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
## 2021-03-20 ENCOUNTER — Other Ambulatory Visit: Payer: Self-pay

## 2021-03-20 ENCOUNTER — Ambulatory Visit: Payer: BC Managed Care – PPO | Attending: Family Medicine

## 2021-03-20 DIAGNOSIS — M25551 Pain in right hip: Secondary | ICD-10-CM | POA: Insufficient documentation

## 2021-03-20 DIAGNOSIS — M6281 Muscle weakness (generalized): Secondary | ICD-10-CM | POA: Diagnosis not present

## 2021-03-20 DIAGNOSIS — M25561 Pain in right knee: Secondary | ICD-10-CM | POA: Diagnosis not present

## 2021-03-20 DIAGNOSIS — M542 Cervicalgia: Secondary | ICD-10-CM | POA: Insufficient documentation

## 2021-03-20 DIAGNOSIS — R2689 Other abnormalities of gait and mobility: Secondary | ICD-10-CM | POA: Diagnosis not present

## 2021-03-20 DIAGNOSIS — M545 Low back pain, unspecified: Secondary | ICD-10-CM | POA: Insufficient documentation

## 2021-03-20 NOTE — Patient Instructions (Signed)
Access Code: [email or web address] URL: [email or web address] Date: 03/20/2021 Prepared by: Claude Manges  Exercises Seated Long Arc Quad - 1 x daily - 7 x weekly - 3 sets - 10 reps Seated Hip Adduction Squeeze with Ball - 1 x daily - 7 x weekly - 3 sets - 10 reps Supine Ankle Pumps - 1 x daily - 7 x weekly - 3 sets - 10 reps Supine Lower Trunk Rotation - 1 x daily - 7 x weekly - 3 sets - 10 reps Seated Hamstring Stretch - 1 x daily - 7 x weekly - 3 sets - 10 reps Seated Gentle Upper Trapezius Stretch - 1 x daily - 7 x weekly - 3 sets - 20 sec hold Gentle Levator Scapulae Stretch - 1 x daily - 7 x weekly - 3 sets - 20 sec hold Supine Cervical Rotation AROM on Pillow - 1 x daily - 7 x weekly - 3 sets - 10 reps Supine Chin Tuck - 1 x daily - 7 x weekly - 3 sets - 10 reps Heat - 1 x daily - 7 x weekly - 10-15 min hold
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
## 2021-03-27 DIAGNOSIS — G4733 Obstructive sleep apnea (adult) (pediatric): Secondary | ICD-10-CM | POA: Diagnosis not present

## 2021-04-10 ENCOUNTER — Ambulatory Visit: Payer: BC Managed Care – PPO

## 2021-04-11 ENCOUNTER — Other Ambulatory Visit: Payer: Self-pay | Admitting: Internal Medicine

## 2021-04-21 ENCOUNTER — Ambulatory Visit
Admission: RE | Admit: 2021-04-21 | Discharge: 2021-04-21 | Disposition: A | Discharge status: Home / Self Care | Payer: BC Managed Care – PPO | Source: Ambulatory Visit | Attending: Family Medicine | Admitting: Family Medicine

## 2021-04-21 ENCOUNTER — Other Ambulatory Visit: Payer: Self-pay | Admitting: Family Medicine

## 2021-04-21 ENCOUNTER — Other Ambulatory Visit: Payer: Self-pay

## 2021-04-21 DIAGNOSIS — N644 Mastodynia: Secondary | ICD-10-CM

## 2021-04-21 DIAGNOSIS — N6489 Other specified disorders of breast: Secondary | ICD-10-CM | POA: Diagnosis not present

## 2021-04-21 DIAGNOSIS — R922 Inconclusive mammogram: Secondary | ICD-10-CM | POA: Diagnosis not present

## 2021-07-10 ENCOUNTER — Other Ambulatory Visit: Payer: Self-pay | Admitting: Internal Medicine

## 2021-07-28 ENCOUNTER — Other Ambulatory Visit: Payer: Self-pay | Admitting: Family Medicine

## 2021-07-28 ENCOUNTER — Other Ambulatory Visit: Payer: Self-pay

## 2021-07-28 ENCOUNTER — Ambulatory Visit
Admission: RE | Admit: 2021-07-28 | Discharge: 2021-07-28 | Disposition: A | Discharge status: Home / Self Care | Payer: BC Managed Care – PPO | Source: Ambulatory Visit | Attending: Family Medicine | Admitting: Family Medicine

## 2021-07-28 DIAGNOSIS — R922 Inconclusive mammogram: Secondary | ICD-10-CM | POA: Diagnosis not present

## 2021-07-28 DIAGNOSIS — N644 Mastodynia: Secondary | ICD-10-CM

## 2021-08-29 ENCOUNTER — Telehealth: Payer: Self-pay

## 2021-08-29 NOTE — Telephone Encounter (Signed)
Patient called asking if she could be worked in before November 30 for physical for insurance requirement  and asked for a call back

## 2021-08-29 NOTE — Telephone Encounter (Signed)
Pt has been added to schedule for 11/23

## 2021-09-06 ENCOUNTER — Encounter: Payer: Self-pay | Admitting: Internal Medicine

## 2021-09-06 ENCOUNTER — Ambulatory Visit (INDEPENDENT_AMBULATORY_CARE_PROVIDER_SITE_OTHER): Payer: BC Managed Care – PPO | Admitting: Internal Medicine

## 2021-09-06 VITALS — BP 120/78 | HR 86 | Temp 97.9°F | Ht 66.0 in | Wt 214.8 lb

## 2021-09-06 DIAGNOSIS — M2550 Pain in unspecified joint: Secondary | ICD-10-CM

## 2021-09-06 DIAGNOSIS — E049 Nontoxic goiter, unspecified: Secondary | ICD-10-CM

## 2021-09-06 DIAGNOSIS — Z8616 Personal history of COVID-19: Secondary | ICD-10-CM

## 2021-09-06 DIAGNOSIS — G4733 Obstructive sleep apnea (adult) (pediatric): Secondary | ICD-10-CM

## 2021-09-06 DIAGNOSIS — E079 Disorder of thyroid, unspecified: Secondary | ICD-10-CM

## 2021-09-06 DIAGNOSIS — R739 Hyperglycemia, unspecified: Secondary | ICD-10-CM | POA: Diagnosis not present

## 2021-09-06 DIAGNOSIS — Z0000 Encounter for general adult medical examination without abnormal findings: Secondary | ICD-10-CM

## 2021-09-06 DIAGNOSIS — Z79899 Other long term (current) drug therapy: Secondary | ICD-10-CM | POA: Diagnosis not present

## 2021-09-06 MED ORDER — NAPROXEN 500 MG PO TABS: 500.0000 mg | ORAL_TABLET | Freq: Two times a day (BID) | ORAL | 0 refills | Status: DC

## 2021-09-06 NOTE — Patient Instructions (Signed)
Good to see you today   Lab today .  Consider prevnar 20 .   Pap due next year .  Health Maintenance, Female Adopting a healthy lifestyle and getting preventive care are important in promoting health and wellness. Ask your health care provider about: The right schedule for you to have regular tests and exams. Things you can do on your own to prevent diseases and keep yourself healthy. What should I know about diet, weight, and exercise? Eat a healthy diet  Eat a diet that includes plenty of vegetables, fruits, low-fat dairy products, and lean protein. Do not eat a lot of foods that are high in solid fats, added sugars, or sodium. Maintain a healthy weight Body mass index (BMI) is used to identify weight problems. It estimates body fat based on height and weight. Your health care provider can help determine your BMI and help you achieve or maintain a healthy weight. Get regular exercise Get regular exercise. This is one of the most important things you can do for your health. Most adults should: Exercise for at least 150 minutes each week. The exercise should increase your heart rate and make you sweat (moderate-intensity exercise). Do strengthening exercises at least twice a week. This is in addition to the moderate-intensity exercise. Spend less time sitting. Even light physical activity can be beneficial. Watch cholesterol and blood lipids Have your blood tested for lipids and cholesterol at 60 years of age, then have this test every 5 years. Have your cholesterol levels checked more often if: Your lipid or cholesterol levels are high. You are older than 60 years of age. You are at high risk for heart disease. What should I know about cancer screening? Depending on your health history and family history, you may need to have cancer screening at various ages. This may include screening for: Breast cancer. Cervical cancer. Colorectal cancer. Skin cancer. Lung cancer. What should I  know about heart disease, diabetes, and high blood pressure? Blood pressure and heart disease High blood pressure causes heart disease and increases the risk of stroke. This is more likely to develop in people who have high blood pressure readings or are overweight. Have your blood pressure checked: Every 3-5 years if you are 31-67 years of age. Every year if you are 46 years old or older. Diabetes Have regular diabetes screenings. This checks your fasting blood sugar level. Have the screening done: Once every three years after age 4 if you are at a normal weight and have a low risk for diabetes. More often and at a younger age if you are overweight or have a high risk for diabetes. What should I know about preventing infection? Hepatitis B If you have a higher risk for hepatitis B, you should be screened for this virus. Talk with your health care provider to find out if you are at risk for hepatitis B infection. Hepatitis C Testing is recommended for: Everyone born from [birth year] through 1965. Anyone with known risk factors for hepatitis C. Sexually transmitted infections (STIs) Get screened for STIs, including gonorrhea and chlamydia, if: You are sexually active and are younger than 60 years of age. You are older than 60 years of age and your health care provider tells you that you are at risk for this type of infection. Your sexual activity has changed since you were last screened, and you are at increased risk for chlamydia or gonorrhea. Ask your health care provider if you are at risk. Ask your health care  provider about whether you are at high risk for HIV. Your health care provider may recommend a prescription medicine to help prevent HIV infection. If you choose to take medicine to prevent HIV, you should first get tested for HIV. You should then be tested every 3 months for as long as you are taking the medicine. Pregnancy If you are about to stop having your period (premenopausal) and  you may become pregnant, seek counseling before you get pregnant. Take 400 to 800 micrograms (mcg) of folic acid every day if you become pregnant. Ask for birth control (contraception) if you want to prevent pregnancy. Osteoporosis and menopause Osteoporosis is a disease in which the bones lose minerals and strength with aging. This can result in bone fractures. If you are 11 years old or older, or if you are at risk for osteoporosis and fractures, ask your health care provider if you should: Be screened for bone loss. Take a calcium or vitamin D supplement to lower your risk of fractures. Be given hormone replacement therapy (HRT) to treat symptoms of menopause. Follow these instructions at home: Alcohol use Do not drink alcohol if: Your health care provider tells you not to drink. You are pregnant, may be pregnant, or are planning to become pregnant. If you drink alcohol: Limit how much you have to: 0-1 drink a day. Know how much alcohol is in your drink. In the [place] one drink equals one 12 oz bottle of beer (355 mL), one 5 oz glass of wine (148 mL), or one 1 oz glass of hard liquor (44 mL). Lifestyle Do not use any products that contain nicotine or tobacco. These products include cigarettes, chewing tobacco, and vaping devices, such as e-cigarettes. If you need help quitting, ask your health care provider. Do not use street drugs. Do not share needles. Ask your health care provider for help if you need support or information about quitting drugs. General instructions Schedule regular health, dental, and eye exams. Stay current with your vaccines. Tell your health care provider if: You often feel depressed. You have ever been abused or do not feel safe at home. Summary Adopting a healthy lifestyle and getting preventive care are important in promoting health and wellness. Follow your health care provider's instructions about healthy diet, exercising, and getting tested or screened  for diseases. Follow your health care provider's instructions on monitoring your cholesterol and blood pressure. This information is not intended to replace advice given to you by your health care provider. Make sure you discuss any questions you have with your health care provider. Document Revised: 02/20/2021 Document Reviewed: 02/20/2021 Elsevier Patient Education  2022 [employer].
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
## 2021-09-07 LAB — HEPATIC FUNCTION PANEL
AG Ratio: 1.5 (calc) (ref 1.0–2.5)
ALT: 20 U/L (ref 6–29)
AST: 20 U/L (ref 10–35)
Albumin: 4.4 g/dL (ref 3.6–5.1)
Alkaline phosphatase (APISO): 128 U/L (ref 37–153)
Bilirubin, Direct: 0.1 mg/dL (ref 0.0–0.2)
Globulin: 3 g/dL (calc) (ref 1.9–3.7)
Indirect Bilirubin: 0.4 mg/dL (calc) (ref 0.2–1.2)
Total Bilirubin: 0.5 mg/dL (ref 0.2–1.2)
Total Protein: 7.4 g/dL (ref 6.1–8.1)

## 2021-09-07 LAB — BASIC METABOLIC PANEL
BUN/Creatinine Ratio: 28 (calc) — ABNORMAL HIGH (ref 6–22)
BUN: 27 mg/dL — ABNORMAL HIGH (ref 7–25)
CO2: 25 mmol/L (ref 20–32)
Calcium: 10.1 mg/dL (ref 8.6–10.4)
Chloride: 101 mmol/L (ref 98–110)
Creat: 0.95 mg/dL (ref 0.50–1.03)
Glucose, Bld: 95 mg/dL (ref 65–99)
Potassium: 4.1 mmol/L (ref 3.5–5.3)
Sodium: 138 mmol/L (ref 135–146)

## 2021-09-07 LAB — CBC WITH DIFFERENTIAL/PLATELET
Absolute Monocytes: 570 cells/uL (ref 200–950)
Basophils Absolute: 31 cells/uL (ref 0–200)
Basophils Relative: 0.4 %
Eosinophils Absolute: 216 cells/uL (ref 15–500)
Eosinophils Relative: 2.8 %
HCT: 41.7 % (ref 35.0–45.0)
Hemoglobin: 13.8 g/dL (ref 11.7–15.5)
Lymphs Abs: 2556 cells/uL (ref 850–3900)
MCH: 29.9 pg (ref 27.0–33.0)
MCHC: 33.1 g/dL (ref 32.0–36.0)
MCV: 90.5 fL (ref 80.0–100.0)
MPV: 11.3 fL (ref 7.5–12.5)
Monocytes Relative: 7.4 %
Neutro Abs: 4327 cells/uL (ref 1500–7800)
Neutrophils Relative %: 56.2 %
Platelets: 190 10*3/uL (ref 140–400)
RBC: 4.61 10*6/uL (ref 3.80–5.10)
RDW: 12.4 % (ref 11.0–15.0)
Total Lymphocyte: 33.2 %
WBC: 7.7 10*3/uL (ref 3.8–10.8)

## 2021-09-07 LAB — VITAMIN B12: Vitamin B-12: 678 pg/mL (ref 200–1100)

## 2021-09-07 LAB — TSH: TSH: 3.13 mIU/L (ref 0.40–4.50)

## 2021-09-07 LAB — LIPID PANEL
Cholesterol: 167 mg/dL (ref <200)
HDL: 54 mg/dL (ref >=50)
LDL Cholesterol (Calc): 94 mg/dL (calc)
Non-HDL Cholesterol (Calc): 113 mg/dL (calc) (ref <130)
Total CHOL/HDL Ratio: 3.1 (calc) (ref <5.0)
Triglycerides: 101 mg/dL (ref <150)

## 2021-09-07 LAB — HEMOGLOBIN A1C
Hgb A1c MFr Bld: 6.1 % of total Hgb — ABNORMAL HIGH (ref <5.7)
Mean Plasma Glucose: 128 mg/dL
eAG (mmol/L): 7.1 mmol/L

## 2021-09-07 LAB — T4, FREE: Free T4: 1.2 ng/dL (ref 0.8–1.8)

## 2021-09-07 LAB — C-REACTIVE PROTEIN: CRP: 2.1 mg/L (ref <8.0)

## 2021-09-14 NOTE — Progress Notes (Signed)
Blood results are normal or stable.  Blood sugar improved no diabetes thyroid is normal cholesterol is improved.

## 2021-09-19 DIAGNOSIS — G4733 Obstructive sleep apnea (adult) (pediatric): Secondary | ICD-10-CM | POA: Diagnosis not present

## 2021-10-08 ENCOUNTER — Other Ambulatory Visit: Payer: Self-pay | Admitting: Internal Medicine

## 2021-11-16 ENCOUNTER — Telehealth: Payer: Self-pay | Admitting: Internal Medicine

## 2021-11-16 NOTE — Telephone Encounter (Signed)
Last Ov 09/06/21 Please advise  

## 2021-11-16 NOTE — Telephone Encounter (Signed)
Patient wants to be referred over to [facility] for a CT scan of her kidneys. Patient believes that she have a kidney stone and is worried because she have one bad kidney.  Patient could be contacted at [phone].  Please advise.

## 2021-11-16 NOTE — Telephone Encounter (Signed)
Not enough information with this message    Contact patient. Needs clinical triage as to the urgency of the problem.  Offer visit or appropriate clinical advice

## 2021-11-16 NOTE — Telephone Encounter (Signed)
--  pt states she was seen on Friday at [facility] d/t back pain. sx onset friday. lower back pain. has hx of kidney stone and feels that it may be the same pain. was told that she needed a CT, and had orders, called her insurance and was told to go to a specific facility. Went to [place] but was unable to have the diagnostic done. pain of 8/10, last took naproxen at 1000. med helped "a little bit"  11/16/2021 12:45:12 PM SEE PCP WITHIN [place], RN, Adriana  Comments User: Kizzie Fantasia, RN Date/Time Eilene Ghazi Time): 11/16/2021 12:51:40 PM pt states she was fine on Monday and then again started yesterday   11/16/21 at 1521: Pt called. States she's having similar back pain as when she was trying to pass a kidney stone. Triage nurse recommended Heat & pain meds. She has done both & feels better, but is concerned it could be worse later. Appt scheduled with Dr Volanda Napoleon for 11/17/21

## 2021-11-17 ENCOUNTER — Ambulatory Visit: Payer: BC Managed Care – PPO | Admitting: Family Medicine

## 2021-11-20 ENCOUNTER — Encounter: Payer: Self-pay | Admitting: Internal Medicine

## 2021-11-20 ENCOUNTER — Ambulatory Visit: Payer: BC Managed Care – PPO | Admitting: Internal Medicine

## 2021-11-20 VITALS — BP 110/72 | HR 59 | Temp 97.9°F | Ht 66.0 in | Wt 217.2 lb

## 2021-11-20 DIAGNOSIS — N261 Atrophy of kidney (terminal): Secondary | ICD-10-CM

## 2021-11-20 DIAGNOSIS — R109 Unspecified abdominal pain: Secondary | ICD-10-CM

## 2021-11-20 DIAGNOSIS — Z87442 Personal history of urinary calculi: Secondary | ICD-10-CM

## 2021-11-20 DIAGNOSIS — M545 Low back pain, unspecified: Secondary | ICD-10-CM

## 2021-11-20 NOTE — Patient Instructions (Signed)
Ordering ct for  checking stone.   Think this new pain is mechanical back pain  poss from degenerative  back disease .  And effecting neck also   Avoid prolonged sitting . Heat stretching is   ok  sometimes physical therapy .   Will go from there .
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
## 2021-11-21 ENCOUNTER — Ambulatory Visit (HOSPITAL_BASED_OUTPATIENT_CLINIC_OR_DEPARTMENT_OTHER)
Admission: RE | Admit: 2021-11-21 | Discharge: 2021-11-21 | Disposition: A | Discharge status: Home / Self Care | Payer: BC Managed Care – PPO | Source: Ambulatory Visit | Attending: Internal Medicine | Admitting: Internal Medicine

## 2021-11-21 ENCOUNTER — Other Ambulatory Visit: Payer: Self-pay

## 2021-11-21 DIAGNOSIS — R109 Unspecified abdominal pain: Secondary | ICD-10-CM | POA: Diagnosis not present

## 2021-11-21 DIAGNOSIS — Z87442 Personal history of urinary calculi: Secondary | ICD-10-CM | POA: Diagnosis not present

## 2021-11-21 DIAGNOSIS — N261 Atrophy of kidney (terminal): Secondary | ICD-10-CM | POA: Diagnosis not present

## 2021-11-21 DIAGNOSIS — K5730 Diverticulosis of large intestine without perforation or abscess without bleeding: Secondary | ICD-10-CM | POA: Diagnosis not present

## 2021-11-21 DIAGNOSIS — K449 Diaphragmatic hernia without obstruction or gangrene: Secondary | ICD-10-CM | POA: Diagnosis not present

## 2021-11-21 NOTE — Telephone Encounter (Signed)
Patient was seen on 11/20/21

## 2021-11-21 NOTE — Progress Notes (Signed)
Results of CT scan show no kidney stone , scarring in the left kidney which is not new  arthritis in hips mild to moderate, and degenerative disc disease at the lumbosacral spine which is the low back. Mild infiltration of fat into the liver which usually is addressed by healthy eating and weight loss.  So I am suspecting that your pain as we discussed is from arthritis in your spine and also your hip.  There is no cure but certain exercises may help you feel better as we discussed and avoid prolonged sitting and heavy lifting but still maintaining activity. If progressive and persistent we can get you to see a spine specialist or a sports medicine clinician.   Let us know how doing  .

## 2022-01-06 ENCOUNTER — Other Ambulatory Visit: Payer: Self-pay | Admitting: Internal Medicine

## 2022-02-05 ENCOUNTER — Other Ambulatory Visit: Payer: BC Managed Care – PPO

## 2022-02-13 ENCOUNTER — Other Ambulatory Visit: Payer: BC Managed Care – PPO

## 2022-02-16 ENCOUNTER — Other Ambulatory Visit: Payer: Self-pay | Admitting: Family Medicine

## 2022-04-12 ENCOUNTER — Other Ambulatory Visit: Payer: Self-pay | Admitting: Internal Medicine
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
## 2022-07-04 ENCOUNTER — Other Ambulatory Visit: Payer: Self-pay | Admitting: Internal Medicine

## 2022-08-08 ENCOUNTER — Ambulatory Visit: Payer: Self-pay | Admitting: Family Medicine

## 2022-08-08 ENCOUNTER — Ambulatory Visit: Payer: 59 | Admitting: Internal Medicine

## 2022-08-08 ENCOUNTER — Encounter: Payer: Self-pay | Admitting: Internal Medicine

## 2022-08-08 VITALS — BP 108/74 | HR 75 | Temp 98.4°F | Ht 66.0 in | Wt 232.0 lb

## 2022-08-08 DIAGNOSIS — H6993 Unspecified Eustachian tube disorder, bilateral: Secondary | ICD-10-CM | POA: Diagnosis not present

## 2022-08-08 DIAGNOSIS — K219 Gastro-esophageal reflux disease without esophagitis: Secondary | ICD-10-CM

## 2022-08-08 DIAGNOSIS — J4530 Mild persistent asthma, uncomplicated: Secondary | ICD-10-CM

## 2022-08-08 MED ORDER — OMEPRAZOLE 20 MG PO CPDR: 20.0000 mg | DELAYED_RELEASE_CAPSULE | Freq: Every day | ORAL | 3 refills | Status: DC

## 2022-08-08 MED ORDER — PREDNISONE 10 MG PO TABS: ORAL_TABLET | ORAL | 0 refills | Status: DC

## 2022-08-08 MED ORDER — FLUTICASONE PROPIONATE 50 MCG/ACT NA SUSP: 2.0000 | Freq: Every day | NASAL | 6 refills | Status: AC

## 2022-08-08 NOTE — Progress Notes (Signed)
Subjective:    Patient ID: Angelica Gray, female    DOB: 06/04/1961, 61 y.o.   MRN: [id number]      HPI Angelica Gray is here for  Chief Complaint  Patient presents with   Cough    Cough x 2 months; Patient feels like head and ears are full    She has had a cough for approximately 2 months.  The cough is primarily dry.  The cough got worse and she was experiencing some shortness of breath while coughing.  She went to urgent care and had an x-ray done that showed no infection, but was told she might have bronchitis.  She was prescribed a cough syrup, antibiotic and prednisone.  She thinks that may have helped a little.  Cough got worse again and she has been taking over-the-counter Mucinex and Robitussin.  It is taking the edge off, but its not getting better.  Some days she is okay but some days she is not.  She has tested herself for COVID more than once and it was negative.  At times she has some postnasal drip she thinks.  She will have some hoarseness at times.  She states a dry hacking cough.  Occasionally she will bring up some thick phlegm that has a little color to it, but states is not yellow or green.  If she is coughing a lot she will experience shortness of breath and wheezing with coughing.  Her ears feel like they have an air in them.  That makes her feel like she has air in her head.  She thinks the cough may be worse at night and may be worse or more productive after eating.  She denies any reflux.     Medications and allergies reviewed with patient and updated if appropriate.  Current Outpatient Medications on File Prior to Visit  Medication Sig Dispense Refill   Ascorbic Acid (VITAMIN C) 1000 MG tablet Take by mouth.     Cholecalciferol 50 MCG (2000 UT) CAPS Take 2,000 Units by mouth daily.     Cyanocobalamin (VITAMIN B 12 PO) Take by mouth.     levothyroxine (SYNTHROID) 112 MCG tablet TAKE 1 TABLET BY MOUTH ONCE DAILY . APPOINTMENT REQUIRED FOR FUTURE REFILLS 90 tablet  0   naproxen (NAPROSYN) 500 MG tablet TAKE 1 TABLET BY MOUTH TWICE DAILY WITH A MEAL 60 tablet 0   No current facility-administered medications on file prior to visit.    Review of Systems  Constitutional:  Negative for fever.  HENT:  Positive for postnasal drip (maybe some) and voice change (Some days). Negative for congestion, ear pain, rhinorrhea, sinus pressure, sinus pain and sore throat.        Feels like there is air in her ears/head - can not pop ears. Feels like something is stuck in her throat  Respiratory:  Positive for cough (dry hacking - occ coughs up slime balls, little color to it-not yellow or green), shortness of breath (with coughing only) and wheezing (with coughing too much only).   Cardiovascular:  Negative for chest pain.  Gastrointestinal:  Negative for nausea.       No gerd/reflux  Musculoskeletal:  Negative for myalgias.  Neurological:  Negative for dizziness, light-headedness and headaches.       Objective:   Vitals:   08/08/22 1529  BP: 108/74  Pulse: 75  Temp: 98.4 F (36.9 C)  SpO2: 99%   BP Readings from Last 3 Encounters:  08/08/22 108/74  11/20/21 110/72  09/06/21 120/78   Wt Readings from Last 3 Encounters:  08/08/22 232 lb (105.2 kg)  11/20/21 217 lb 3.2 oz (98.5 kg)  09/06/21 214 lb 12.8 oz (97.4 kg)   Body mass index is 37.45 kg/m.    Physical Exam Constitutional:      General: She is not in acute distress.    Appearance: Normal appearance. She is not ill-appearing.  HENT:     Head: Normocephalic and atraumatic.     Right Ear: Tympanic membrane, ear canal and external ear normal.     Left Ear: Tympanic membrane, ear canal and external ear normal.     Mouth/Throat:     Mouth: Mucous membranes are moist.     Pharynx: No oropharyngeal exudate or posterior oropharyngeal erythema.  Eyes:     Conjunctiva/sclera: Conjunctivae normal.  Cardiovascular:     Rate and Rhythm: Normal rate and regular rhythm.  Pulmonary:     Effort:  Pulmonary effort is normal. No respiratory distress.     Breath sounds: Wheezing (Slight expiratory wheeze and expiration) present. No rales.     Comments: Bronchial sounding cough Musculoskeletal:     Cervical back: Neck supple. No tenderness.  Lymphadenopathy:     Cervical: No cervical adenopathy.  Skin:    General: Skin is warm and dry.  Neurological:     Mental Status: She is alert.            Assessment & Plan:    Cough, reactive airway disease: Cough for 2 months, associated with some shortness of breath and wheezing when coughing only No evidence of active infection so no antibiotic needed Her symptoms do sound like reactive airway disease Prednisone taper prescribed  Eustachian tube dysfunction: Your symptoms are consistent with bilateral eustachian tube dysfunction Prednisone taper will likely help Start Flonase nasal spray once daily  GERD: She denies reflux, but does have history of reflux and hiatal hernia and did have surgery in the past Her cough is sometimes worse at night and after eating so I am concerned she may have an element of reflux Start omeprazole 20 mg daily-advised to take for 1 month and then try stopping  Discussed that if she has questions or concerns regarding these symptoms she can contact me or her PCP Follow-up if no improvement

## 2022-08-08 NOTE — Patient Instructions (Addendum)
       Medications changes include :   prednisone taper, flonase nasal spray once daily, omeprazole 20 mg daily    Return if symptoms worsen or fail to improve.

## 2022-08-23 IMAGING — CT CT RENAL STONE PROTOCOL
2 of 4 series · 16 of 46 positions shown, 18 images · non-contrast
Comparison: [DATE] abdominal ultrasound. 08/03/2015 CT from
[REDACTED].

CLINICAL DATA: Right flank pain, beginning [REDACTED]. History
renal stones. Known atrophic kidney.



[Series 2: axial st · axial · 0.98mm/px · z∈[-520,-100]mm · 13 of 92 slices shown, 15 images]
[im 4/92  soft-tissue]
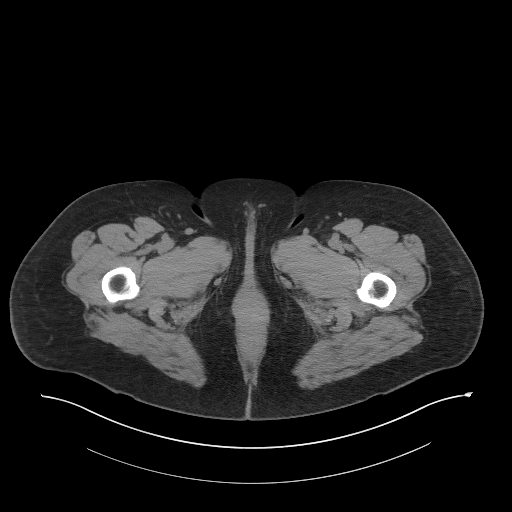
[im 4/92  bone]
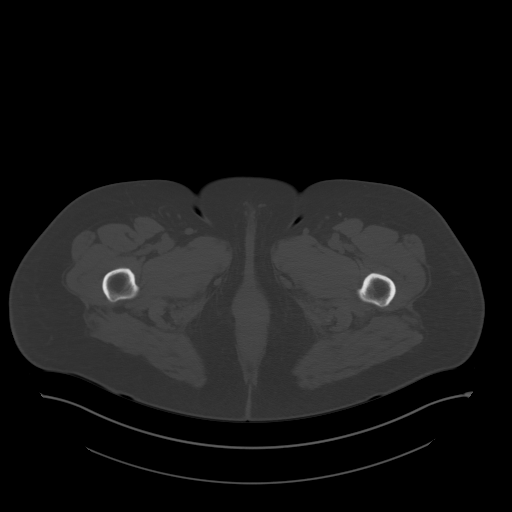
[im 12/92  soft-tissue]
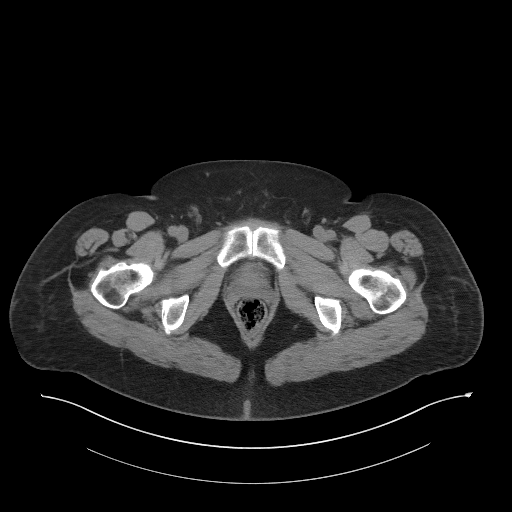
[im 19/92  soft-tissue]
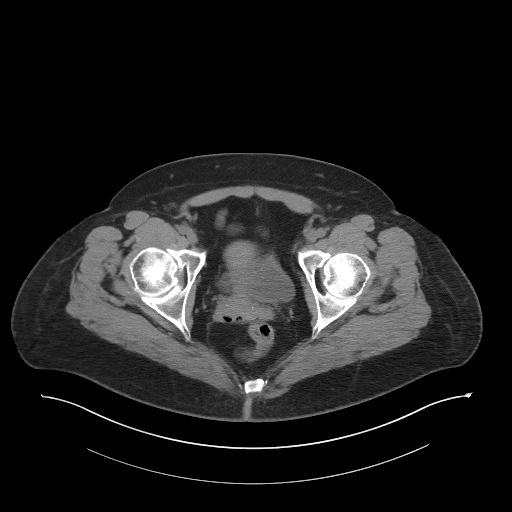
[im 27/92  soft-tissue]
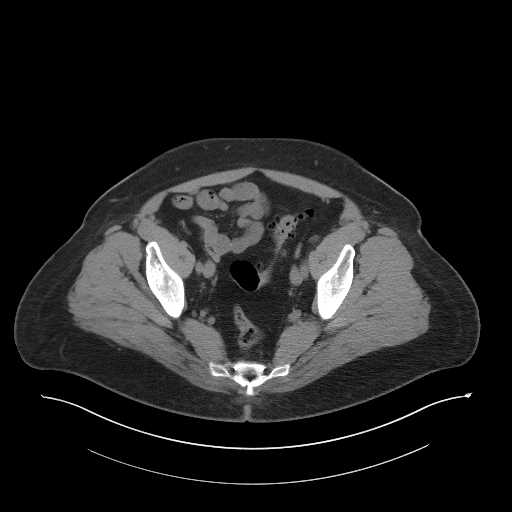
[im 31/92  soft-tissue]
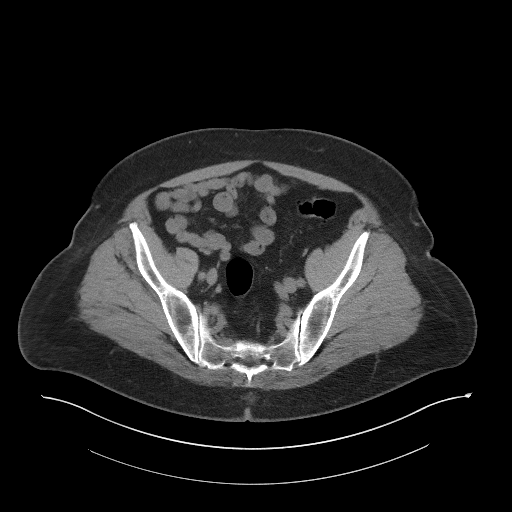
[im 38/92  soft-tissue]
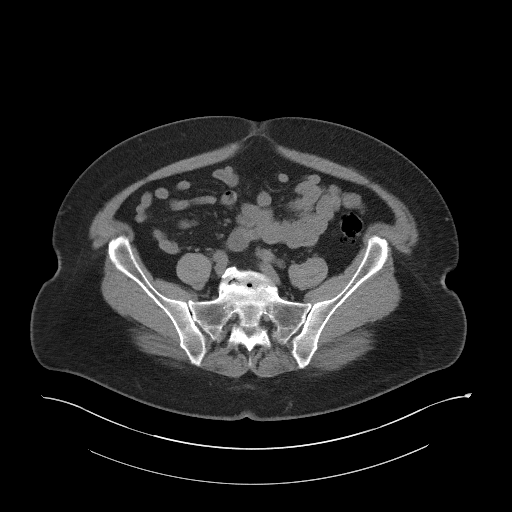
[im 46/92  soft-tissue]
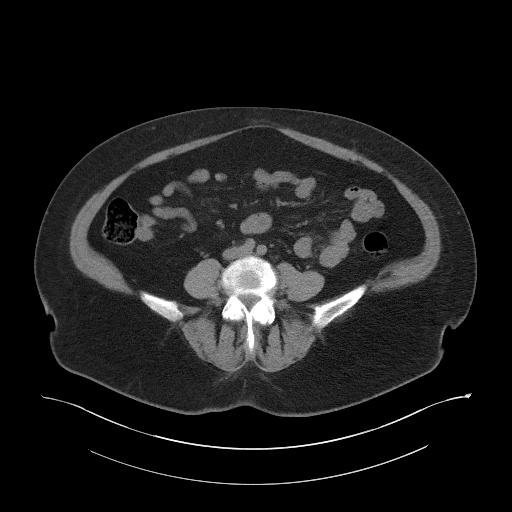
[im 54/92  soft-tissue]
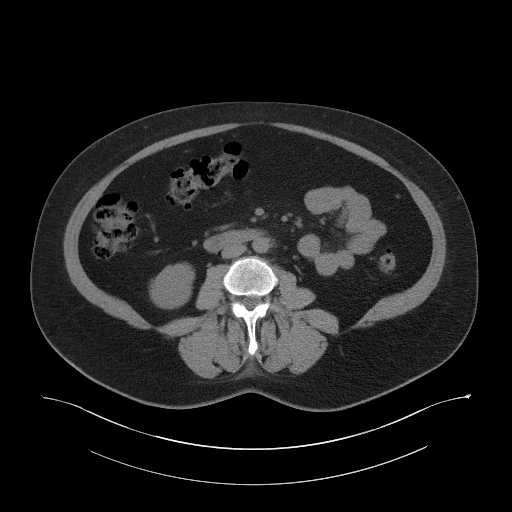
[im 61/92  soft-tissue]
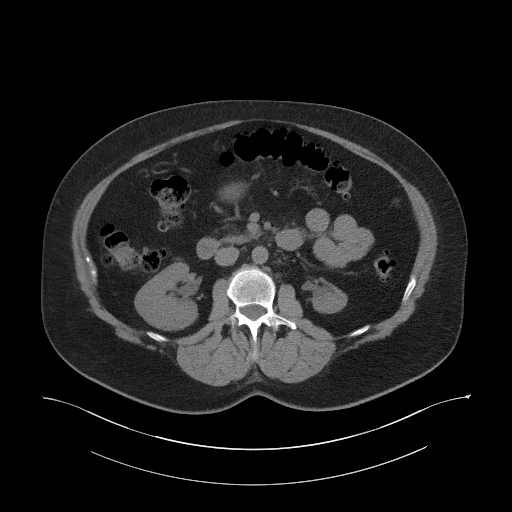
[im 61/92  bone]
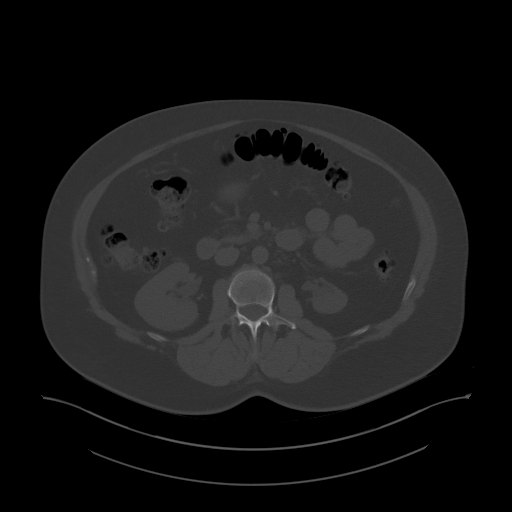
[im 65/92  soft-tissue]
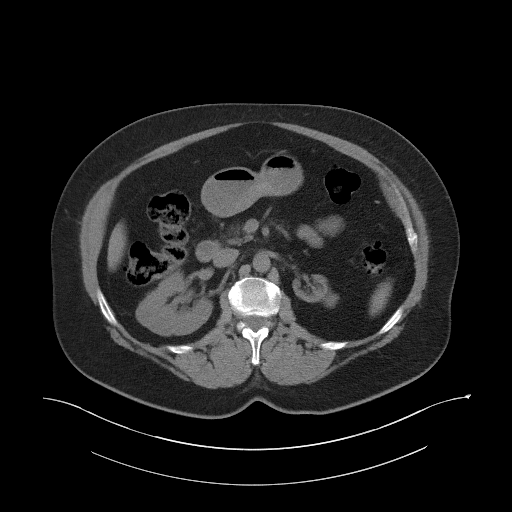
[im 73/92  soft-tissue]
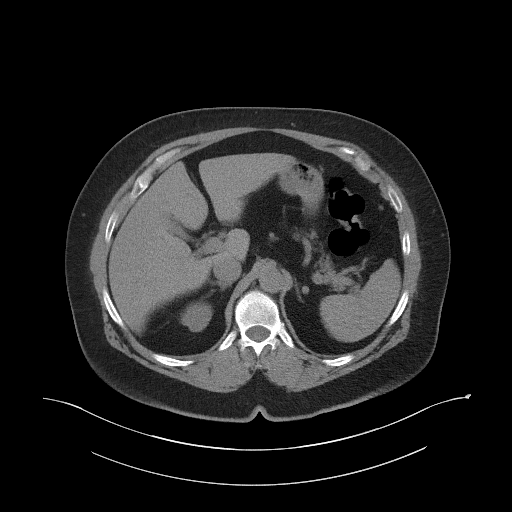
[im 80/92  soft-tissue]
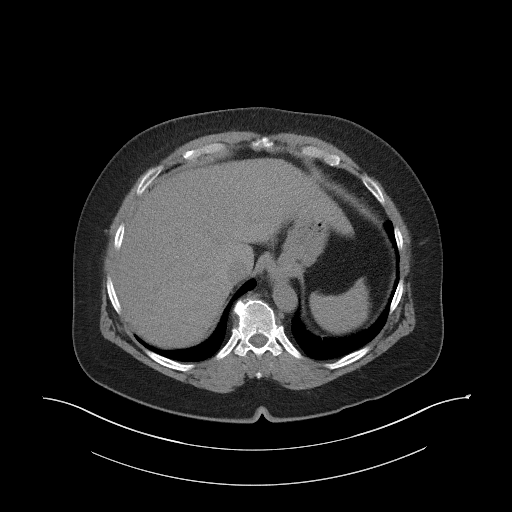
[im 88/92  soft-tissue]
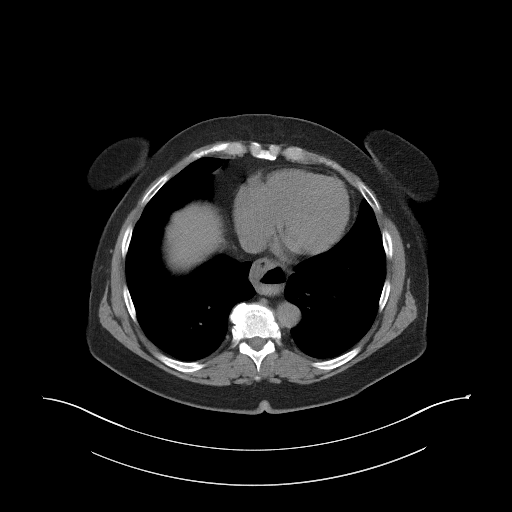

[Series 5: coronal st · coronal · 0.97mm/px · 3 of 116 slices shown]
[im 39/116  soft-tissue]
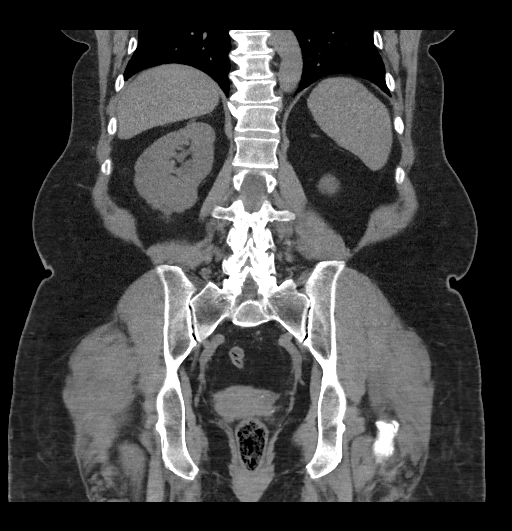
[im 52/116  soft-tissue]
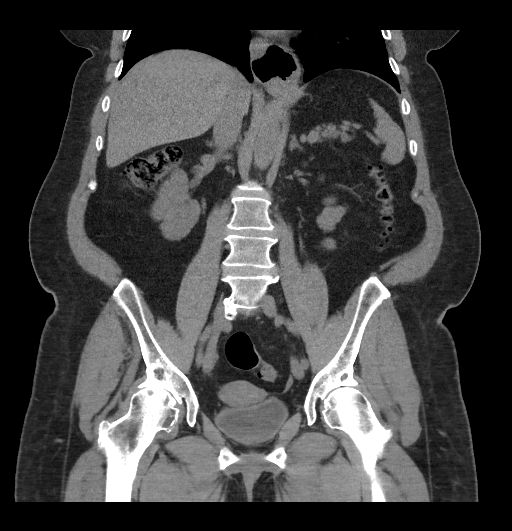
[im 64/116  soft-tissue]
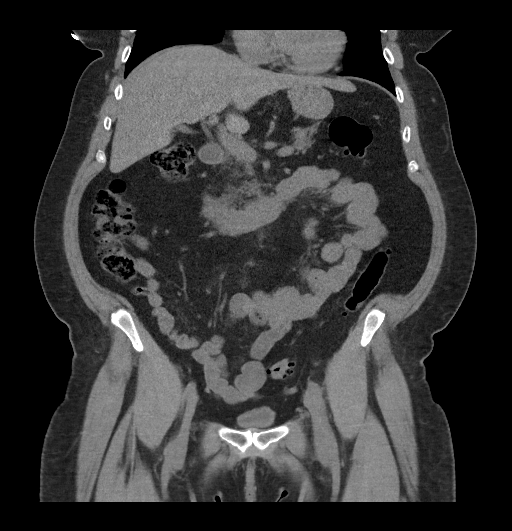

[16 of 46 positions shown; findings below may reference images not displayed]

FINDINGS: Lower chest: Clear lung bases. Normal heart size without pericardial
or pleural effusion. Surgical changes at the gastroesophageal
junction, with a small to moderate hiatal hernia, similar.

Hepatobiliary: Mild hepatic steatosis. Normal gallbladder, without
biliary ductal dilatation.

Pancreas: Fatty replacement throughout the pancreas. No duct
dilatation or acute inflammation.

Spleen: Normal in size, without focal abnormality.

Adrenals/Urinary Tract: Normal adrenal glands. Moderate to marked
left renal scarring/atrophy, most significant in the upper pole.
Similar.

Mild scarring involving the upper pole right kidney. No renal
calculi or hydronephrosis. No hydroureter or ureteric calculi. No
bladder calculi.

Stomach/Bowel: Normal remainder of the stomach. Extensive colonic
diverticulosis. Normal terminal ileum and appendix. Normal small
bowel.

Vascular/Lymphatic: Aortic atherosclerosis. No abdominopelvic
adenopathy.

Reproductive: Normal uterus and adnexa.

Other: No significant free fluid.  No free intraperitoneal air.

Musculoskeletal: Bilateral hip osteoarthritis, mild-to-moderate.
Degenerative disc disease at the lumbosacral junction.
IMPRESSION: 1.  No urinary tract calculi or hydronephrosis.
2. Left renal moderate to marked atrophy with mild upper pole right
renal scarring.
3. Normal appendix.
4. Surgical changes at the gastroesophageal junction with similar
hiatal hernia.
5. Mild hepatic steatosis.

## 2022-08-29 ENCOUNTER — Encounter: Payer: Self-pay | Admitting: Internal Medicine

## 2022-08-29 ENCOUNTER — Ambulatory Visit: Payer: 59 | Admitting: Internal Medicine

## 2022-08-29 VITALS — BP 118/66 | HR 80 | Temp 98.1°F | Ht 66.0 in | Wt 229.0 lb

## 2022-08-29 DIAGNOSIS — Z23 Encounter for immunization: Secondary | ICD-10-CM

## 2022-08-29 DIAGNOSIS — R051 Acute cough: Secondary | ICD-10-CM | POA: Diagnosis not present

## 2022-08-29 DIAGNOSIS — R062 Wheezing: Secondary | ICD-10-CM | POA: Diagnosis not present

## 2022-08-29 DIAGNOSIS — R739 Hyperglycemia, unspecified: Secondary | ICD-10-CM

## 2022-08-29 DIAGNOSIS — R059 Cough, unspecified: Secondary | ICD-10-CM | POA: Insufficient documentation

## 2022-08-29 MED ORDER — ALBUTEROL SULFATE HFA 108 (90 BASE) MCG/ACT IN AERS: 2.0000 | INHALATION_SPRAY | Freq: Four times a day (QID) | RESPIRATORY_TRACT | 1 refills | Status: AC | PRN

## 2022-08-29 MED ORDER — HYDROCODONE BIT-HOMATROP MBR 5-1.5 MG/5ML PO SOLN: 5.0000 mL | Freq: Four times a day (QID) | ORAL | 0 refills | Status: AC | PRN

## 2022-08-29 MED ORDER — AZITHROMYCIN 250 MG PO TABS: ORAL_TABLET | ORAL | 1 refills | Status: AC

## 2022-08-29 MED ORDER — PREDNISONE 10 MG PO TABS: ORAL_TABLET | ORAL | 0 refills | Status: DC

## 2022-08-29 NOTE — Assessment & Plan Note (Signed)
Mild to mod, c/w URI, for antibx course,  cough med prn, to f/u any worsening symptoms or concerns

## 2022-08-29 NOTE — Progress Notes (Unsigned)
Patient ID: Angelica Gray, female   DOB: 1961/07/27, 61 y.o.   MRN: [id number]        Chief Complaint: follow up upper resp symptoms, cough and wheezing, left ear pain       HPI:  Angelica Gray is a 61 y.o. female here with c/o 3 mo ongoing symptoms of congestion and left ear fullness and pain, and more recently  Here with 2-3 days acute onset fever, facial pain, pressure, headache, general weakness and malaise, and greenish d/c, with mild ST and cough, but pt denies chest pain, orthopnea, PND, increased LE swelling, palpitations, dizziness or syncope, though has had some wheezing and sob x 1 day.   Pt denies polydipsia, polyuria, or new focal neuro s/s.          Wt Readings from Last 3 Encounters:  08/29/22 229 lb (103.9 kg)  08/08/22 232 lb (105.2 kg)  11/20/21 217 lb 3.2 oz (98.5 kg)   BP Readings from Last 3 Encounters:  08/29/22 118/66  08/08/22 108/74  11/20/21 110/72         Past Medical History:  Diagnosis Date   Arthritis    Atrophic kidney left   History of hiatal hernia    History of renal stone 2004   Hyperlipidemia    Hypothyroidism    Insomnia    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 02/01/2015   RESTLESS LEG SYNDROME, MILD 11/07/2007   Qualifier: Diagnosis of  By: Arnoldo Morale MD, Balinda Quails    Sleep apnea    no CPAP    Past Surgical History:  Procedure Laterality Date   COLONOSCOPY     ESOPHAGOGASTRODUODENOSCOPY     HIATAL HERNIA REPAIR N/A 09/05/2015   Procedure: LAPAROSCOPIC REPAIR OF HIATAL HERNIA;  Surgeon: Alphonsa Overall, MD;  Location: [facility];  Service: General;  Laterality: N/A;   [place]  2001    reports that she has never smoked. She has never used smokeless tobacco. She reports that she does not drink alcohol and does not use drugs. family history includes Diabetes in her father and mother; Esophageal cancer in her paternal grandmother; Heart  attack in her father; Hyperlipidemia in her mother; Kidney failure in her mother; Throat cancer in her paternal grandmother; Thyroid disease in her mother and sister. No Known Allergies Current Outpatient Medications on File Prior to Visit  Medication Sig Dispense Refill   Ascorbic Acid (VITAMIN C) 1000 MG tablet Take by mouth.     Cholecalciferol 50 MCG (2000 UT) CAPS Take 2,000 Units by mouth daily.     Cyanocobalamin (VITAMIN B 12 PO) Take by mouth.     fluticasone (FLONASE) 50 MCG/ACT nasal spray Place 2 sprays into both nostrils daily. 16 g 6   levothyroxine (SYNTHROID) 112 MCG tablet TAKE 1 TABLET BY MOUTH ONCE DAILY . APPOINTMENT REQUIRED FOR FUTURE REFILLS 90 tablet 0   naproxen (NAPROSYN) 500 MG tablet TAKE 1 TABLET BY MOUTH TWICE DAILY WITH A MEAL 60 tablet 0   omeprazole (PRILOSEC) 20 MG capsule Take 1 capsule (20 mg total) by mouth daily. 30 capsule 3   No current facility-administered medications on file prior to visit.        ROS:  All others reviewed and negative.  Objective        PE:  BP 118/66 (BP Location: Left Arm, Patient  Position: Sitting, Cuff Size: Large)   Pulse 80   Temp 98.1 F (36.7 C) (Oral)   Ht 5\' 6"  (1.676 m)   Wt 229 lb (103.9 kg)   SpO2 94%   BMI 36.96 kg/m                 Constitutional: Pt appears in NAD               HENT: Head: NCAT.                Right Ear: External ear normal.                 Left Ear: External ear normal.  right tm's with mild erythema but left with severe erythema, bulging,  Max sinus areas mild tender.  Pharynx with mild erythema, no exudate               Eyes: . Pupils are equal, round, and reactive to light. Conjunctivae and EOM are normal               Nose: without d/c or deformity               Neck: Neck supple. Gross normal ROM               Cardiovascular: Normal rate and regular rhythm.                 Pulmonary/Chest: Effort normal and breath sounds without rales and few wheezing.                           Neurological: Pt is alert. At baseline orientation, motor grossly intact               Skin: Skin is warm. No rashes, no other new lesions, LE edema - none               Psychiatric: Pt behavior is normal without agitation   Micro: none  Cardiac tracings I have personally interpreted today:  none  Pertinent Radiological findings (summarize): none   Lab Results  Component Value Date   WBC 7.7 09/06/2021   HGB 13.8 09/06/2021   HCT 41.7 09/06/2021   PLT 190 09/06/2021   GLUCOSE 95 09/06/2021   CHOL 167 09/06/2021   TRIG 101 09/06/2021   HDL 54 09/06/2021   LDLDIRECT 122.0 02/15/2010   LDLCALC 94 09/06/2021   ALT 20 09/06/2021   AST 20 09/06/2021   NA 138 09/06/2021   K 4.1 09/06/2021   CL 101 09/06/2021   CREATININE 0.95 09/06/2021   BUN 27 (H) 09/06/2021   CO2 25 09/06/2021   TSH 3.13 09/06/2021   INR 1.02 04/18/2010   HGBA1C 6.1 (H) 09/06/2021   POCT - covid and flu negative  Assessment/Plan:  Angelica Gray is a 61 y.o. White or Caucasian [1] female with  has a past medical history of Arthritis, Atrophic kidney (left), History of hiatal hernia, History of renal stone (2004), Hyperlipidemia, Hypothyroidism, Insomnia, 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 (02/01/2015), RESTLESS LEG SYNDROME, MILD (11/07/2007), and Sleep apnea.  Cough Mild to mod, c/w URI, for antibx course,  cough med prn, to f/u any worsening symptoms or concerns   Wheezing Mild to mod, for prednisone taper, albuterol hfa inhaler prn,  to f/u any worsening symptoms or concerns   Hyperglycemia Lab Results  Component Value Date   HGBA1C  6.1 (H) 09/06/2021   Stable, pt to continue current medical treatment  - diet, wt control, excercise  Followup: Return if symptoms worsen or fail to improve.  Cathlean Cower, MD 08/29/2022 4:52 PM [facility]

## 2022-08-29 NOTE — Assessment & Plan Note (Signed)
Mild to mod, for prednisone taper, albuterol hfa inhaler prn,  to f/u any worsening symptoms or concerns

## 2022-08-29 NOTE — Patient Instructions (Addendum)
Your COVID and flu testing are negative  Please take all new medication as prescribed - the antibiotic, cough medicine, prednisone and inhaler as needed  Please continue all other medications as before, and refills have been done if requested.  Please have the pharmacy call with any other refills you may need.  Please keep your appointments with your specialists as you may have planned

## 2022-08-29 NOTE — Assessment & Plan Note (Signed)
Lab Results  Component Value Date   HGBA1C 6.1 (H) 09/06/2021   Stable, pt to continue current medical treatment  - diet, wt control, excercise

## 2022-08-30 LAB — POC SOFIA SARS ANTIGEN FIA: SARS Coronavirus 2 Ag: NEGATIVE

## 2022-08-30 LAB — POC INFLUENZA A&B (BINAX/QUICKVUE)
Influenza A, POC: NEGATIVE
Influenza B, POC: NEGATIVE

## 2022-08-31 ENCOUNTER — Encounter: Payer: Self-pay | Admitting: Internal Medicine

## 2022-10-09 ENCOUNTER — Other Ambulatory Visit: Payer: Self-pay | Admitting: Internal Medicine

## 2022-12-27 ENCOUNTER — Other Ambulatory Visit: Payer: Self-pay | Admitting: Internal Medicine

## 2023-01-05 ENCOUNTER — Other Ambulatory Visit: Payer: Self-pay | Admitting: Internal Medicine

## 2023-02-04 ENCOUNTER — Other Ambulatory Visit: Payer: Self-pay | Admitting: Internal Medicine

## 2023-02-07 ENCOUNTER — Other Ambulatory Visit: Payer: Self-pay | Admitting: Internal Medicine

## 2023-02-13 DIAGNOSIS — Z0000 Encounter for general adult medical examination without abnormal findings: Secondary | ICD-10-CM | POA: Insufficient documentation

## 2023-02-13 NOTE — Progress Notes (Unsigned)
No chief complaint on file.   HPI: Patient  Angelica Gray  62 y.o. comes in today for Preventive Health Care visit   Health Maintenance  Topic Date Due   HIV Screening  Never done   Zoster Vaccines- Shingrix (1 of 2) Never done   PAP SMEAR-Modifier  11/04/2020   COVID-19 Vaccine (4 - 2023-24 season) 06/15/2022   INFLUENZA VACCINE  05/16/2023   MAMMOGRAM  07/29/2023   COLONOSCOPY (Pts 45-18yrs Insurance coverage will need to be confirmed)  01/31/2025   DTaP/Tdap/Td (4 - Td or Tdap) 08/12/2030   Hepatitis C Screening  Completed   HPV VACCINES  Aged Out   Health Maintenance Review LIFESTYLE:  Exercise:   Tobacco/ETS: Alcohol:  Sugar beverages: Sleep: Drug use: no HH of  Work:    ROS:  GEN/ HEENT: No fever, significant weight changes sweats headaches vision problems hearing changes, CV/ PULM; No chest pain shortness of breath cough, syncope,edema  change in exercise tolerance. GI /GU: No adominal pain, vomiting, change in bowel habits. No blood in the stool. No significant GU symptoms. SKIN/HEME: ,no acute skin rashes suspicious lesions or bleeding. No lymphadenopathy, nodules, masses.  NEURO/ PSYCH:  No neurologic signs such as weakness numbness. No depression anxiety. IMM/ Allergy: No unusual infections.  Allergy .   REST of 12 system review negative except as per HPI   Past Medical History:  Diagnosis Date   Arthritis    Atrophic kidney left   History of hiatal hernia    History of renal stone 2004   Hyperlipidemia    Hypothyroidism    Insomnia    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 02/01/2015   RESTLESS LEG SYNDROME, MILD 11/07/2007   Qualifier: Diagnosis of  By: Lovell Sheehan MD, Balinda Quails    Sleep apnea    no CPAP     Past Surgical History:  Procedure Laterality Date   COLONOSCOPY     ESOPHAGOGASTRODUODENOSCOPY     HIATAL HERNIA REPAIR N/A 09/05/2015   Procedure: LAPAROSCOPIC REPAIR OF HIATAL HERNIA;  Surgeon: Ovidio Kin,  MD;  Location: [facility];  Service: General;  Laterality: N/A;   KIDNEY STONE SURGERY     MANDIBLE FRACTURE SURGERY     REFRACTIVE SURGERY     TUBAL LIGATION  2001    Family History  Problem Relation Age of Onset   Kidney failure Mother        from dm and ht  had 11 children    Hyperlipidemia Mother    Diabetes Mother    Thyroid disease Mother    Diabetes Father    Heart attack Father        dec age 38   Throat cancer Paternal Grandmother        dipped stuff   Esophageal cancer Paternal Grandmother    Thyroid disease Sister    Colon cancer Neg Hx    Stomach cancer Neg Hx     Social History   Socioeconomic History   Marital status: Married    Spouse name: Not on file   Number of children: 4   Years of education: Not on file   Highest education level: Not on file  Occupational History   Occupation: special events planner    Comment: [employer]  Tobacco Use   Smoking status: Never   Smokeless tobacco: Never  Vaping Use   Vaping Use: Never used  Substance and Sexual Activity   Alcohol use: No  Alcohol/week: 0.0 standard drinks of alcohol   Drug use: No   Sexual activity: Never  Other Topics Concern   Not on file  Social History Narrative   Works Forensic scientist   HH of  5     no tob  And    G4P4neg tad    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 8 g 1   Ascorbic Acid (VITAMIN C) 1000 MG tablet Take by mouth.     Cholecalciferol 50 MCG (2000 UT) CAPS Take 2,000 Units by mouth daily.      Cyanocobalamin (VITAMIN B 12 PO) Take by mouth.     fluticasone (FLONASE) 50 MCG/ACT nasal spray Place 2 sprays into both nostrils daily. 16 g 6   levothyroxine (SYNTHROID) 112 MCG tablet TAKE 1 TABLET BY MOUTH ONCE DAILY BEFORE BREAKFAST. APPOINTMENT REQUIRED FOR REFILLS 90 tablet 0   naproxen (NAPROSYN) 500 MG tablet 1 po bid with meals as needed for joint pain (,need appt for future refills ) 30 tablet 0   omeprazole (PRILOSEC) 20 MG capsule Take 1 capsule (20 mg total) by mouth daily. 30 capsule 3   predniSONE (DELTASONE) 10 MG tablet Take 4 tabs po qd x 3 days, then 3 tabs po qd x 3 days, then 2 tabs po qd x 3 days, then 1 tab po qd x 3 days 30 tablet 0   No facility-administered medications prior to visit.     EXAM:  There were no vitals taken for this visit.  There is no height or weight on file to calculate BMI. Wt Readings from Last 3 Encounters:  08/29/22 229 lb (103.9 kg)  08/08/22 232 lb (105.2 kg)  11/20/21 217 lb 3.2 oz (98.5 kg)    Physical Exam: Vital signs reviewed ZOX:WRUE is a well-developed well-nourished alert cooperative    who appearsr stated age in no acute distress.  HEENT: normocephalic atraumatic , Eyes: PERRL EOM's full, conjunctiva clear, Nares: paten,t no deformity discharge or tenderness., Ears: no deformity EAC's clear TMs with normal landmarks. Mouth: clear OP, no lesions, edema.  Moist mucous membranes. Dentition in adequate repair. NECK: supple without masses, thyromegaly or bruits. CHEST/PULM:  Clear to auscultation and percussion breath sounds equal no wheeze , rales or rhonchi. No chest wall deformities or tenderness. Breast: normal by inspection . No dimpling, discharge, masses, tenderness or discharge . CV: PMI is nondisplaced, S1 S2 no gallops, murmurs, rubs. Peripheral pulses are full without delay.No JVD .  ABDOMEN: Bowel sounds normal nontender  No guard or rebound, no hepato splenomegal no CVA tenderness.  No hernia. Extremtities:  No  clubbing cyanosis or edema, no acute joint swelling or redness no focal atrophy NEURO:  Oriented x3, cranial nerves 3-12 appear to be intact, no obvious focal weakness,gait within normal limits no abnormal reflexes or asymmetrical SKIN: No acute rashes normal turgor, color, no bruising or petechiae. PSYCH: Oriented, good eye contact, no obvious depression anxiety, cognition and judgment appear normal. LN: no cervical  axillary inguinal adenopathy  Lab Results  Component Value Date   WBC 7.7 09/06/2021   HGB 13.8 09/06/2021   HCT 41.7 09/06/2021   PLT 190 09/06/2021   GLUCOSE 95 09/06/2021   CHOL 167 09/06/2021   TRIG 101 09/06/2021   HDL 54 09/06/2021   LDLDIRECT 122.0 02/15/2010   LDLCALC 94 09/06/2021   ALT 20 09/06/2021   AST 20 09/06/2021   NA 138 09/06/2021   K 4.1 09/06/2021   CL 101 09/06/2021   CREATININE 0.95 09/06/2021   BUN 27 (H) 09/06/2021   CO2 25 09/06/2021   TSH 3.13 09/06/2021   INR 1.02 04/18/2010   HGBA1C 6.1 (H) 09/06/2021    BP Readings from Last 3 Encounters:  08/29/22 118/66  08/08/22 108/74  11/20/21 110/72    Lab results reviewed with patient   ASSESSMENT AND PLAN:  Discussed the following assessment and plan:    ICD-10-CM   1. Visit for preventive health examination  Z00.00     2. Hypothyroidism, unspecified type  E03.9     3. Medication management  Z79.899     4. OSA (obstructive sleep apnea)  G47.33     5. History of repair of hiatal hernia  Z98.890    Z87.19     Update lab monitoring No follow-ups on file.  Patient Care Team: Kallyn Demarcus, Neta Mends, MD as PCP - General (Internal Medicine) Rollene Rotunda, MD as PCP - Cardiology (Cardiology) Iva Boop, MD as Consulting Physician (Gastroenterology) There are no Patient Instructions on file for this visit.  Neta Mends. Zohar Maroney M.D.

## 2023-02-14 ENCOUNTER — Other Ambulatory Visit (HOSPITAL_COMMUNITY)
Admission: RE | Admit: 2023-02-14 | Discharge: 2023-02-14 | Disposition: A | Discharge status: Home / Self Care | Payer: 59 | Source: Ambulatory Visit | Attending: Internal Medicine | Admitting: Internal Medicine

## 2023-02-14 ENCOUNTER — Ambulatory Visit (INDEPENDENT_AMBULATORY_CARE_PROVIDER_SITE_OTHER): Payer: 59 | Admitting: Internal Medicine

## 2023-02-14 ENCOUNTER — Encounter: Payer: Self-pay | Admitting: Internal Medicine

## 2023-02-14 VITALS — BP 100/70 | HR 95 | Temp 97.5°F | Ht 65.25 in | Wt 228.0 lb

## 2023-02-14 DIAGNOSIS — Z6841 Body Mass Index (BMI) 40.0 and over, adult: Secondary | ICD-10-CM

## 2023-02-14 DIAGNOSIS — Z0000 Encounter for general adult medical examination without abnormal findings: Secondary | ICD-10-CM | POA: Insufficient documentation

## 2023-02-14 DIAGNOSIS — Z124 Encounter for screening for malignant neoplasm of cervix: Secondary | ICD-10-CM | POA: Diagnosis present

## 2023-02-14 DIAGNOSIS — Z79899 Other long term (current) drug therapy: Secondary | ICD-10-CM

## 2023-02-14 DIAGNOSIS — M79606 Pain in leg, unspecified: Secondary | ICD-10-CM | POA: Diagnosis not present

## 2023-02-14 DIAGNOSIS — Z23 Encounter for immunization: Secondary | ICD-10-CM | POA: Diagnosis not present

## 2023-02-14 DIAGNOSIS — E039 Hypothyroidism, unspecified: Secondary | ICD-10-CM

## 2023-02-14 DIAGNOSIS — Z8719 Personal history of other diseases of the digestive system: Secondary | ICD-10-CM

## 2023-02-14 DIAGNOSIS — Z0001 Encounter for general adult medical examination with abnormal findings: Secondary | ICD-10-CM

## 2023-02-14 DIAGNOSIS — R739 Hyperglycemia, unspecified: Secondary | ICD-10-CM

## 2023-02-14 DIAGNOSIS — Z9889 Other specified postprocedural states: Secondary | ICD-10-CM | POA: Diagnosis not present

## 2023-02-14 DIAGNOSIS — G4733 Obstructive sleep apnea (adult) (pediatric): Secondary | ICD-10-CM | POA: Diagnosis not present

## 2023-02-14 LAB — CBC WITH DIFFERENTIAL/PLATELET
Basophils Absolute: 0 10*3/uL (ref 0.0–0.1)
Basophils Relative: 0.8 % (ref 0.0–3.0)
Eosinophils Absolute: 0.1 10*3/uL (ref 0.0–0.7)
Eosinophils Relative: 1.5 % (ref 0.0–5.0)
HCT: 40.9 % (ref 36.0–46.0)
Hemoglobin: 13.9 g/dL (ref 12.0–15.0)
Lymphocytes Relative: 29.4 % (ref 12.0–46.0)
Lymphs Abs: 1.7 10*3/uL (ref 0.7–4.0)
MCHC: 33.8 g/dL (ref 30.0–36.0)
MCV: 87.8 fl (ref 78.0–100.0)
Monocytes Absolute: 0.4 10*3/uL (ref 0.1–1.0)
Monocytes Relative: 7.8 % (ref 3.0–12.0)
Neutro Abs: 3.4 10*3/uL (ref 1.4–7.7)
Neutrophils Relative %: 60.5 % (ref 43.0–77.0)
Platelets: 201 10*3/uL (ref 150.0–400.0)
RBC: 4.66 Mil/uL (ref 3.87–5.11)
RDW: 13.1 % (ref 11.5–15.5)
WBC: 5.7 10*3/uL (ref 4.0–10.5)

## 2023-02-14 LAB — COMPREHENSIVE METABOLIC PANEL
ALT: 24 U/L (ref 0–35)
AST: 25 U/L (ref 0–37)
Albumin: 4.1 g/dL (ref 3.5–5.2)
Alkaline Phosphatase: 151 U/L — ABNORMAL HIGH (ref 39–117)
BUN: 17 mg/dL (ref 6–23)
CO2: 24 mEq/L (ref 19–32)
Calcium: 9.6 mg/dL (ref 8.4–10.5)
Chloride: 106 mEq/L (ref 96–112)
Creatinine, Ser: 0.93 mg/dL (ref 0.40–1.20)
GFR: 66.4 mL/min (ref >60.00)
Glucose, Bld: 135 mg/dL — ABNORMAL HIGH (ref 70–99)
Potassium: 4.1 mEq/L (ref 3.5–5.1)
Sodium: 139 mEq/L (ref 135–145)
Total Bilirubin: 0.8 mg/dL (ref 0.2–1.2)
Total Protein: 7.5 g/dL (ref 6.0–8.3)

## 2023-02-14 LAB — LIPID PANEL
Cholesterol: 160 mg/dL (ref 0–200)
HDL: 46.8 mg/dL (ref >39.00)
LDL Cholesterol: 97 mg/dL (ref 0–99)
NonHDL: 113.46
Total CHOL/HDL Ratio: 3
Triglycerides: 82 mg/dL (ref 0.0–149.0)
VLDL: 16.4 mg/dL (ref 0.0–40.0)

## 2023-02-14 LAB — HEMOGLOBIN A1C: Hgb A1c MFr Bld: 8.1 % — ABNORMAL HIGH (ref 4.6–6.5)

## 2023-02-14 LAB — TSH: TSH: 1.99 u[IU]/mL (ref 0.35–5.50)

## 2023-02-14 LAB — T4, FREE: Free T4: 1.01 ng/dL (ref 0.60–1.60)

## 2023-02-14 MED ORDER — OMEPRAZOLE 20 MG PO CPDR: 20.0000 mg | DELAYED_RELEASE_CAPSULE | Freq: Every day | ORAL | 3 refills | Status: AC

## 2023-02-14 NOTE — Patient Instructions (Addendum)
Good to see you today . Pap and lab results when aviable. Consider seeing  ortho por sports medicine for the ongoing back leg sx if a problem . Will refer as planned  Healthy weight loss can help sometimes.

## 2023-02-14 NOTE — Progress Notes (Signed)
Blood sugar is now in diabetic range    Please arrange a fu visit (can be virtual ) to discuss  medication options

## 2023-02-18 LAB — CYTOLOGY - PAP
Comment: NEGATIVE
Diagnosis: NEGATIVE
High risk HPV: NEGATIVE

## 2023-02-18 NOTE — Progress Notes (Signed)
Pap and hpv screen negative . Can repeat in 4-5 years

## 2023-02-19 ENCOUNTER — Telehealth: Payer: Self-pay | Admitting: Internal Medicine

## 2023-02-19 NOTE — Telephone Encounter (Signed)
Patient returned call for results. °

## 2023-02-19 NOTE — Telephone Encounter (Signed)
Spoke to pt. See lab note 

## 2023-02-28 ENCOUNTER — Telehealth (INDEPENDENT_AMBULATORY_CARE_PROVIDER_SITE_OTHER): Payer: 59 | Admitting: Internal Medicine

## 2023-02-28 ENCOUNTER — Encounter: Payer: Self-pay | Admitting: Internal Medicine

## 2023-02-28 ENCOUNTER — Other Ambulatory Visit: Payer: Self-pay | Admitting: Internal Medicine

## 2023-02-28 VITALS — Ht 65.25 in | Wt 228.0 lb

## 2023-02-28 DIAGNOSIS — Z79899 Other long term (current) drug therapy: Secondary | ICD-10-CM

## 2023-02-28 DIAGNOSIS — M79606 Pain in leg, unspecified: Secondary | ICD-10-CM

## 2023-02-28 DIAGNOSIS — Z7984 Long term (current) use of oral hypoglycemic drugs: Secondary | ICD-10-CM

## 2023-02-28 DIAGNOSIS — E119 Type 2 diabetes mellitus without complications: Secondary | ICD-10-CM | POA: Diagnosis not present

## 2023-02-28 DIAGNOSIS — Z833 Family history of diabetes mellitus: Secondary | ICD-10-CM | POA: Diagnosis not present

## 2023-02-28 MED ORDER — METFORMIN HCL ER 500 MG PO TB24: 500.0000 mg | ORAL_TABLET | Freq: Every day | ORAL | 2 refills | Status: DC

## 2023-02-28 NOTE — Progress Notes (Signed)
3 mos future labs

## 2023-02-28 NOTE — Progress Notes (Signed)
Virtual Visit via Video Note  I connected with Angelica Gray on 02/28/23 at  9:00 AM EDT by a video enabled telemedicine application and verified that I am speaking with the correct person using two identifiers. Location patient: home Location provider:work office Persons participating in the virtual visit: patient, provider  Patient aware  of the limitations of evaluation and management by telemedicine and  availability of in person appointments. and agreed to proceed.   HPI: Angelica Gray presents for video visit  fu labs now in diabetic range  Feels can be helped byt diet changes  prefers no meds  . Concern about  metformin  family member had kidney failure and ? From metformin and taken off. ( Patient has one functional kidney)   Having on going  leg pain uncertain cause  worse in am ?  Not sure joinmt or leg back  Family member with ra  past hx of mva.  No weakness?  ROS: See pertinent positives and negatives per HPI.  Past Medical History:  Diagnosis Date   Arthritis    Atrophic kidney left   History of hiatal hernia    History of renal stone 2004   Hyperlipidemia    Hypothyroidism    Insomnia    Iron deficiency anemia due to chronic blood loss - Angelica Gray 02/01/2015   RESTLESS LEG SYNDROME, MILD 11/07/2007   Qualifier: Diagnosis of  By: Angelica Sheehan MD, Angelica Gray    Sleep apnea    no CPAP     Past Surgical History:  Procedure Laterality Date   COLONOSCOPY     ESOPHAGOGASTRODUODENOSCOPY     HIATAL HERNIA REPAIR N/A 09/05/2015   Procedure: LAPAROSCOPIC REPAIR OF HIATAL HERNIA;  Surgeon: Angelica Kin, MD;  Location: [facility];  Service: General;  Laterality: N/A;   KIDNEY STONE SURGERY     MANDIBLE FRACTURE SURGERY     REFRACTIVE SURGERY     TUBAL LIGATION  2001    Family History  Problem Relation Age of Onset   Kidney failure Mother        from dm and ht  had 11 children    Hyperlipidemia Mother    Diabetes Mother    Thyroid disease Mother    Diabetes Father     Heart attack Father        dec age 17   Throat cancer Paternal Grandmother        dipped stuff   Esophageal cancer Paternal Grandmother    Thyroid disease Sister    Colon cancer Neg Hx    Stomach cancer Neg Hx     Social History   Tobacco Use   Smoking status: Never   Smokeless tobacco: Never  Vaping Use   Vaping Use: Never used  Substance Use Topics   Alcohol use: No    Alcohol/week: 0.0 standard drinks of alcohol   Drug use: No      Current Outpatient Medications:    Cholecalciferol 50 MCG (2000 UT) CAPS, Take 2,000 Units by mouth daily., Disp: , Rfl:    Cyanocobalamin (VITAMIN B 12 PO), Take by mouth., Disp: , Rfl:    fluticasone (FLONASE) 50 MCG/ACT nasal spray, Place 2 sprays into both nostrils daily., Disp: 16 g, Rfl: 6   levothyroxine (SYNTHROID) 112 MCG tablet, TAKE 1 TABLET BY MOUTH ONCE DAILY BEFORE BREAKFAST. APPOINTMENT REQUIRED FOR REFILLS, Disp: 90 tablet, Rfl: 0   metFORMIN (GLUCOPHAGE-XR) 500 MG 24 hr tablet, Take 1 tablet (500 mg total) by mouth  daily with breakfast., Disp: 90 tablet, Rfl: 2   naproxen (NAPROSYN) 500 MG tablet, 1 po bid with meals as needed for joint pain (,need appt for future refills ), Disp: 30 tablet, Rfl: 0   omeprazole (PRILOSEC) 20 MG capsule, Take 1 capsule (20 mg total) by mouth daily., Disp: 30 capsule, Rfl: 3   albuterol (VENTOLIN HFA) 108 (90 Base) MCG/ACT inhaler, Inhale 2 puffs into the lungs every 6 (six) hours as needed for wheezing or shortness of breath. (Patient not taking: Reported on 02/14/2023), Disp: 8 g, Rfl: 1   Ascorbic Acid (VITAMIN C) 1000 MG tablet, Take by mouth. (Patient not taking: Reported on 02/14/2023), Disp: , Rfl:   EXAM: BP Readings from Last 3 Encounters:  02/14/23 100/70  08/29/22 118/66  08/08/22 108/74    VITALS per patient if applicable:  GENERAL: alert, oriented, appears well and in no acute distress  HEENT: atraumatic, conjunttiva clear, no obvious abnormalities on inspection of external nose  and ears  NECK: normal movements of the head and neck  LUNGS: on inspection no signs of respiratory distress, breathing rate appears normal, no obvious gross SOB, gasping or wheezing  PSYCH/NEURO: pleasant and cooperative, no obvious depression or anxiety, speech and thought processing grossly intact Lab Results  Component Value Date   WBC 5.7 02/14/2023   HGB 13.9 02/14/2023   HCT 40.9 02/14/2023   PLT 201.0 02/14/2023   GLUCOSE 135 (H) 02/14/2023   CHOL 160 02/14/2023   TRIG 82.0 02/14/2023   HDL 46.80 02/14/2023   LDLDIRECT 122.0 02/15/2010   LDLCALC 97 02/14/2023   ALT 24 02/14/2023   AST 25 02/14/2023   NA 139 02/14/2023   K 4.1 02/14/2023   CL 106 02/14/2023   CREATININE 0.93 02/14/2023   BUN 17 02/14/2023   CO2 24 02/14/2023   TSH 1.99 02/14/2023   INR 1.02 04/18/2010   HGBA1C 8.1 (H) 02/14/2023    ASSESSMENT AND PLAN:  Discussed the following assessment and plan:    ICD-10-CM   1. New onset type 2 diabetes mellitus (HCC)  E11.9     2. Medication management  Z79.899     3. Pain of lower extremity, unspecified laterality  M79.606 Ambulatory referral to Sports Medicine   sound arthritis but not specific joint daughter has ra  plan ref  Angelica Gray consdier rheum deosnt seem vascular by hx    4. Family history of diabetes mellitus  Z83.3    mom  had esrd  and Angelica Gray    Disc plan intensify diet LSI  Encouraged to try 500 metformin per day  Consider candidate for glp1 or glp1/gip meds or  other sglt2 as indicated  Told well address statin intervention other  parameters that need to fu   Counseled.   Expectant management and discussion of plan and treatment with opportunity to ask questions and all were answered. The patient agreed with the plan and demonstrated an understanding of the instructions.   Advised to call back or seek an in-person evaluation if worsening  or having  further concerns  in interim. Return in about 3 months (around 05/31/2023)  for a1c and urin mmicroalb.    Berniece Andreas, MD

## 2023-04-15 ENCOUNTER — Other Ambulatory Visit (INDEPENDENT_AMBULATORY_CARE_PROVIDER_SITE_OTHER): Payer: 59

## 2023-04-15 ENCOUNTER — Other Ambulatory Visit: Payer: 59

## 2023-04-15 DIAGNOSIS — Z79899 Other long term (current) drug therapy: Secondary | ICD-10-CM | POA: Diagnosis not present

## 2023-04-15 DIAGNOSIS — E119 Type 2 diabetes mellitus without complications: Secondary | ICD-10-CM

## 2023-04-15 LAB — MICROALBUMIN / CREATININE URINE RATIO
Creatinine,U: 170.9 mg/dL
Microalb Creat Ratio: 0.5 mg/g (ref 0.0–30.0)
Microalb, Ur: 0.9 mg/dL (ref 0.0–1.9)

## 2023-04-15 LAB — HEMOGLOBIN A1C: Hgb A1c MFr Bld: 7.5 % — ABNORMAL HIGH (ref 4.6–6.5)

## 2023-04-15 NOTE — Progress Notes (Signed)
A1c is better but  would like further improvement. No protein n urine. Will discuss further at your up coming visit

## 2023-04-30 NOTE — Progress Notes (Unsigned)
No chief complaint on file.   HPI: Angelica Gray 62 y.o. come in for  ROS: See pertinent positives and negatives per HPI.  Past Medical History:  Diagnosis Date   Arthritis    Atrophic kidney left   History of hiatal hernia    History of renal stone 2004   Hyperlipidemia    Hypothyroidism    Insomnia    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 02/01/2015   RESTLESS LEG SYNDROME, MILD 11/07/2007   Qualifier: Diagnosis of  By: Lovell Sheehan MD, Balinda Quails    Sleep apnea    no CPAP     Family History  Problem Relation Age of Onset   Kidney failure Mother        from dm and ht  had 11 children    Hyperlipidemia Mother    Diabetes Mother    Thyroid disease Mother    Diabetes Father    Heart attack Father        dec age 62   Throat cancer Paternal Grandmother        dipped stuff   Esophageal cancer Paternal Grandmother    Thyroid disease Sister    Colon cancer Neg Hx    Stomach cancer Neg Hx     Social History   Socioeconomic History   Marital status: Married    Spouse name: Not on file   Number of children: 4   Years of education: Not on file   Highest education level: Not on file  Occupational History   Occupation: special events planner    Comment: Visual merchand[employer]  Tobacco Use   Smoking status: Never   Smokeless tobacco: Never  Vaping Use   Vaping status: Never Used  Substance and Sexual Activity   Alcohol use: No    Alcohol/week: 0.0 standard drinks of alcohol   Drug use: No   Sexual activity: Not Currently  Other Topics Concern   Not on file  Social History Narrative   Works Forensic scientist   HH of  5     no tob  And    G4P4neg tad    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   Social Determinants of Health   Financial Resource Strain: Not on file  Food Insecurity: Not on file  Transportation Needs: Not on file  Physical Activity: Insufficiently Active (02/14/2023)   Exercise Vital Sign    Days of  Exercise per Week: 1 day    Minutes of Exercise per Session: 30 min  Stress: No Stress Concern Present (02/14/2023)   [employer] of Occupational Health - Occupational Stress Questionnaire    Feeling of Stress : Only a little  Social Connections: Socially Integrated (02/14/2023)   Social Connection and Isolation Panel [NHANES]    Frequency of Communication with Friends and Family: More than three times a week    Frequency of Social Gatherings with Friends and Family: More than three times a week    Attends Religious Services: More than 4 times per year    Active Member of [employer] or Organizations: Yes    Attends Engineer, structural: More than 4 times per year    Marital Status: Married    Outpatient Medications Prior to Visit  Medication Sig Dispense Refill   albuterol (VENTOLIN HFA) 108 (90 Base) MCG/ACT inhaler Inhale 2 puffs into the lungs every 6 (six) hours as needed for wheezing or shortness of breath. (Patient  not taking: Reported on 02/14/2023) 8 g 1   Ascorbic Acid (VITAMIN C) 1000 MG tablet Take by mouth. (Patient not taking: Reported on 02/14/2023)     Cholecalciferol 50 MCG (2000 UT) CAPS Take 2,000 Units by mouth daily.     Cyanocobalamin (VITAMIN B 12 PO) Take by mouth.     fluticasone (FLONASE) 50 MCG/ACT nasal spray Place 2 sprays into both nostrils daily. 16 g 6   levothyroxine (SYNTHROID) 112 MCG tablet TAKE 1 TABLET BY MOUTH ONCE DAILY BEFORE BREAKFAST. APPOINTMENT REQUIRED FOR REFILLS 90 tablet 0   metFORMIN (GLUCOPHAGE-XR) 500 MG 24 hr tablet Take 1 tablet (500 mg total) by mouth daily with breakfast. 90 tablet 2   naproxen (NAPROSYN) 500 MG tablet 1 po bid with meals as needed for joint pain (,need appt for future refills ) 30 tablet 0   omeprazole (PRILOSEC) 20 MG capsule Take 1 capsule (20 mg total) by mouth daily. 30 capsule 3   No facility-administered medications prior to visit.     EXAM:  There were no vitals taken for this visit.  There is no  height or weight on file to calculate BMI.  GENERAL: vitals reviewed and listed above, alert, oriented, appears well hydrated and in no acute distress HEENT: atraumatic, conjunctiva  clear, no obvious abnormalities on inspection of external nose and ears OP : no lesion edema or exudate  NECK: no obvious masses on inspection palpation  LUNGS: clear to auscultation bilaterally, no wheezes, rales or rhonchi, good air movement CV: HRRR, no clubbing cyanosis or  peripheral edema nl cap refill  MS: moves all extremities without noticeable focal  abnormality PSYCH: pleasant and cooperative, no obvious depression or anxiety Lab Results  Component Value Date   WBC 5.7 02/14/2023   HGB 13.9 02/14/2023   HCT 40.9 02/14/2023   PLT 201.0 02/14/2023   GLUCOSE 135 (H) 02/14/2023   CHOL 160 02/14/2023   TRIG 82.0 02/14/2023   HDL 46.80 02/14/2023   LDLDIRECT 122.0 02/15/2010   LDLCALC 97 02/14/2023   ALT 24 02/14/2023   AST 25 02/14/2023   NA 139 02/14/2023   K 4.1 02/14/2023   CL 106 02/14/2023   CREATININE 0.93 02/14/2023   BUN 17 02/14/2023   CO2 24 02/14/2023   TSH 1.99 02/14/2023   INR 1.02 04/18/2010   HGBA1C 7.5 (H) 04/15/2023   MICROALBUR 0.9 04/15/2023   BP Readings from Last 3 Encounters:  02/14/23 100/70  08/29/22 118/66  08/08/22 108/74    ASSESSMENT AND PLAN:  Discussed the following assessment and plan:  No diagnosis found.  -Patient advised to return or notify health care team  if  new concerns arise.  There are no Patient Instructions on file for this visit.   Neta Mends. Katy Brickell M.D.

## 2023-05-01 ENCOUNTER — Telehealth: Payer: Self-pay

## 2023-05-01 ENCOUNTER — Encounter: Payer: Self-pay | Admitting: Internal Medicine

## 2023-05-01 ENCOUNTER — Ambulatory Visit: Payer: 59 | Admitting: Internal Medicine

## 2023-05-01 VITALS — BP 94/64 | HR 71 | Temp 98.4°F | Ht 65.25 in | Wt 225.8 lb

## 2023-05-01 DIAGNOSIS — T887XXA Unspecified adverse effect of drug or medicament, initial encounter: Secondary | ICD-10-CM

## 2023-05-01 DIAGNOSIS — Z01818 Encounter for other preprocedural examination: Secondary | ICD-10-CM | POA: Diagnosis not present

## 2023-05-01 DIAGNOSIS — Z79899 Other long term (current) drug therapy: Secondary | ICD-10-CM

## 2023-05-01 DIAGNOSIS — Z6837 Body mass index (BMI) 37.0-37.9, adult: Secondary | ICD-10-CM

## 2023-05-01 DIAGNOSIS — M1611 Unilateral primary osteoarthritis, right hip: Secondary | ICD-10-CM

## 2023-05-01 DIAGNOSIS — G4733 Obstructive sleep apnea (adult) (pediatric): Secondary | ICD-10-CM

## 2023-05-01 DIAGNOSIS — E119 Type 2 diabetes mellitus without complications: Secondary | ICD-10-CM

## 2023-05-01 DIAGNOSIS — Z23 Encounter for immunization: Secondary | ICD-10-CM | POA: Diagnosis not present

## 2023-05-01 MED ORDER — OZEMPIC (0.25 OR 0.5 MG/DOSE) 2 MG/3ML ~~LOC~~ SOPN: 0.2500 mg | PEN_INJECTOR | SUBCUTANEOUS | 2 refills | Status: DC

## 2023-05-01 NOTE — Patient Instructions (Addendum)
We can try    ozempic    because you can't   tolerate metformin. Will need approval and  then try use coupon  manufacturer.  Will have to stop before surgery .  As discussed   Shingrix 2 today.  Suggest  get back with optimization for cpap management but if doing ok can discuss with anesthesia Will send form to your surgeon for clearance .  Optimize muscle mass resistance activity  Limit avoid  antiinflammatory such as naproxyn ,ibuprofen to protect kidney function.

## 2023-05-01 NOTE — Telephone Encounter (Signed)
Angelica Gray (Key: [id number]) Ozempic (0.25 or 0.5 MG/DOSE) 2MG /3ML pen-injectors

## 2023-05-02 NOTE — Telephone Encounter (Signed)
Request Reference Number: [id number]. OZEMPIC INJ 2MG /3ML is approved through 04/30/2024. Your patient may now fill this prescription and it will be covered.. Authorization Expiration Date: April 30, 2024.

## 2023-05-03 NOTE — Telephone Encounter (Signed)
Spoke to pharmacy tech yesterday and update them on the approval. She states it had went through and pt already pick it up.

## 2023-05-07 ENCOUNTER — Other Ambulatory Visit: Payer: Self-pay | Admitting: Family

## 2023-05-20 ENCOUNTER — Ambulatory Visit: Payer: 59

## 2023-05-23 ENCOUNTER — Other Ambulatory Visit: Payer: Self-pay

## 2023-05-23 MED ORDER — LEVOTHYROXINE SODIUM 112 MCG PO TABS: 112.0000 ug | ORAL_TABLET | Freq: Every day | ORAL | 1 refills | Status: DC

## 2023-07-07 ENCOUNTER — Other Ambulatory Visit: Payer: Self-pay | Admitting: Internal Medicine

## 2023-07-13 ENCOUNTER — Other Ambulatory Visit: Payer: Self-pay | Admitting: Internal Medicine

## 2023-08-06 ENCOUNTER — Other Ambulatory Visit: Payer: Self-pay | Admitting: Family

## 2023-08-12 ENCOUNTER — Other Ambulatory Visit: Payer: Self-pay | Admitting: Family

## 2023-08-13 ENCOUNTER — Ambulatory Visit (INDEPENDENT_AMBULATORY_CARE_PROVIDER_SITE_OTHER): Payer: 59 | Admitting: Internal Medicine

## 2023-08-13 ENCOUNTER — Encounter: Payer: Self-pay | Admitting: Internal Medicine

## 2023-08-13 ENCOUNTER — Other Ambulatory Visit: Payer: Self-pay

## 2023-08-13 VITALS — BP 110/80 | HR 81 | Temp 98.2°F | Ht 65.25 in | Wt 219.8 lb

## 2023-08-13 DIAGNOSIS — Z01818 Encounter for other preprocedural examination: Secondary | ICD-10-CM | POA: Diagnosis not present

## 2023-08-13 DIAGNOSIS — Z23 Encounter for immunization: Secondary | ICD-10-CM

## 2023-08-13 DIAGNOSIS — E118 Type 2 diabetes mellitus with unspecified complications: Secondary | ICD-10-CM

## 2023-08-13 DIAGNOSIS — E039 Hypothyroidism, unspecified: Secondary | ICD-10-CM

## 2023-08-13 DIAGNOSIS — M1611 Unilateral primary osteoarthritis, right hip: Secondary | ICD-10-CM | POA: Diagnosis not present

## 2023-08-13 DIAGNOSIS — G4733 Obstructive sleep apnea (adult) (pediatric): Secondary | ICD-10-CM

## 2023-08-13 DIAGNOSIS — Z79899 Other long term (current) drug therapy: Secondary | ICD-10-CM

## 2023-08-13 MED ORDER — NAPROXEN 500 MG PO TABS: ORAL_TABLET | ORAL | 0 refills | Status: DC

## 2023-08-13 MED ORDER — SEMAGLUTIDE (1 MG/DOSE) 4 MG/3ML ~~LOC~~ SOPN: 1.0000 mg | PEN_INJECTOR | SUBCUTANEOUS | 3 refills | Status: DC

## 2023-08-13 NOTE — Patient Instructions (Signed)
Good to see  you today . Will send in from for optimization to your surgeon.  A1c is much better  5.9%.  Plan increase dose of ozempic to 1 mg per week ( I sent in rx)  Plan  rov in 4 months ( will check meds and A1c ) or as  needed

## 2023-08-13 NOTE — Progress Notes (Unsigned)
Chief Complaint  Patient presents with   Pre-op Exam    Pt is here to re-do her A1c. Her last one, didn't pass the clearance for surgery.     HPI: Angelica Gray 62 y.o. come in for presurgical evaluation for r THA  Last A1c was too hihg for surgery . Since then she has been losing weight and feeling better  Ozempic 0.5   weekly  not checking bg per se  doing ok some constipation Bp has been good REsp: OSA CPAP and  rare use of albuterol . Feels respiratory status is better recovered from COVID 3 years ago .  Exercise toleracne can  walk up 2 flights of stairs if Hip is not bothering her . Had eval for palpitations a few years ago  monitor was ok and no recurrance . Feels was from covid and anxiety  ROS: See pertinent positives and negatives per HPI. No neuro sx no bleeding cp sob  syncope.   Past Medical History:  Diagnosis Date   Arthritis    Atrophic kidney left   History of hiatal hernia    History of renal stone 2004   Hyperlipidemia    Hypothyroidism    Insomnia    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 02/01/2015   RESTLESS LEG SYNDROME, MILD 11/07/2007   Qualifier: Diagnosis of  By: Lovell Sheehan MD, Balinda Quails    Sleep apnea    no CPAP     Family History  Problem Relation Age of Onset   Kidney failure Mother        from dm and ht  had 11 children    Hyperlipidemia Mother    Diabetes Mother    Thyroid disease Mother    Diabetes Father    Heart attack Father        dec age 23   Throat cancer Paternal Grandmother        dipped stuff   Esophageal cancer Paternal Grandmother    Thyroid disease Sister    Colon cancer Neg Hx    Stomach cancer Neg Hx     Social History   Socioeconomic History   Marital status: Married    Spouse name: Not on file   Number of children: 4   Years of education: Not on file   Highest education level: Not on file  Occupational History   Occupation: special events planner    Comment: Visual merchandiser  Tobacco  Use   Smoking status: Never   Smokeless tobacco: Never  Vaping Use   Vaping status: Never Used  Substance and Sexual Activity   Alcohol use: No    Alcohol/week: 0.0 standard drinks of alcohol   Drug use: No   Sexual activity: Not Currently  Other Topics Concern   Not on file  Social History Narrative   Works Forensic scientist   HH of  5     no tob  And    [id number] tad    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   Social Determinants of Health   Financial Resource Strain: Not on file  Food Insecurity: Not on file  Transportation Needs: Not on file  Physical Activity: Insufficiently Active (02/14/2023)   Exercise Vital Sign    Days of Exercise per Week: 1 day    Minutes of Exercise per Session: 30 min  Stress: No Stress Concern Present (02/14/2023)   [employer] of Occupational Health - Occupational  Stress Questionnaire    Feeling of Stress : Only a little  Social Connections: Socially Integrated (02/14/2023)   Social Connection and Isolation Panel [NHANES]    Frequency of Communication with Friends and Family: More than three times a week    Frequency of Social Gatherings with Friends and Family: More than three times a week    Attends Religious Services: More than 4 times per year    Active Member of [employer] or Organizations: Yes    Attends Engineer, structural: More than 4 times per year    Marital Status: Married    Outpatient Medications Prior to Visit  Medication Sig Dispense Refill   albuterol (VENTOLIN HFA) 108 (90 Base) MCG/ACT inhaler Inhale 2 puffs into the lungs every 6 (six) hours as needed for wheezing or shortness of breath. 8 g 1   Cholecalciferol 50 MCG (2000 UT) CAPS Take 2,000 Units by mouth daily.     Cyanocobalamin (VITAMIN B 12 PO) Take by mouth.     fluticasone (FLONASE) 50 MCG/ACT nasal spray Place 2 sprays into both nostrils daily. (Patient taking differently: Place 2 sprays into both nostrils as needed.) 16 g 6    levothyroxine (SYNTHROID) 112 MCG tablet Take 1 tablet (112 mcg total) by mouth daily before breakfast. TAKE 1 TABLET BY MOUTH ONCE DAILY BEFORE BREAKFAST. 90 tablet 1   naproxen (NAPROSYN) 500 MG tablet TAKE 1 TABLET BY MOUTH TWICE DAILY WITH MEALS AS NEEDED FOR  JOINT  PAIN 30 tablet 0   OZEMPIC, 0.25 OR 0.5 MG/DOSE, 2 MG/3ML SOPN INJECT 0.25 MG INTO THE SKIN ONCE A WEEK. INCREASE TO 0.5 MG PER WEEK AFTER 4 WEEKS (Patient taking differently: Inject 0.5 mg into the skin once a week. Increase to 0.5 mg per week after 4 weeks) 3 mL 0   Ascorbic Acid (VITAMIN C) 1000 MG tablet Take by mouth. (Patient not taking: Reported on 08/13/2023)     omeprazole (PRILOSEC) 20 MG capsule Take 1 capsule (20 mg total) by mouth daily. (Patient not taking: Reported on 08/13/2023) 30 capsule 3   metFORMIN (GLUCOPHAGE-XR) 500 MG 24 hr tablet Take 1 tablet (500 mg total) by mouth daily with breakfast. (Patient not taking: Reported on 05/01/2023) 90 tablet 2   No facility-administered medications prior to visit.     EXAM:  BP 110/80 (BP Location: Left Arm, Patient Position: Sitting, Cuff Size: Large)   Pulse 81   Temp 98.2 F (36.8 C) (Oral)   Ht 5' 5.25" (1.657 m)   Wt 219 lb 12.8 oz (99.7 kg)   SpO2 96%   BMI 36.30 kg/m   Body mass index is 36.3 kg/m. Wt Readings from Last 3 Encounters:  08/13/23 219 lb 12.8 oz (99.7 kg)  05/01/23 225 lb 12.8 oz (102.4 kg)  02/28/23 228 lb (103.4 kg)    GENERAL: vitals reviewed and listed above, alert, oriented, appears well hydrated and in no acute distress HEENT: atraumatic, conjunctiva  clear, no obvious abnormalities on inspection of external nose and ears  tms clear OP : no lesion edema or exudate  NECK: no obvious masses on inspection palpation  LUNGS: clear to auscultation bilaterally, no wheezes, rales or rhonchi, good air movement CV: HRRR, no clubbing cyanosis or  peripheral edema nl cap refill  Abdomen:  Sof,t normal bowel sounds without hepatosplenomegaly,  no guarding rebound or masses no CVA tenderness MS: moves all extremities without noticeable focal  abnormality walks with limp( hip)  Neuro non focal . PSYCH: pleasant  and cooperative, no obvious depression or anxiety Lab Results  Component Value Date   WBC 5.7 02/14/2023   HGB 13.9 02/14/2023   HCT 40.9 02/14/2023   PLT 201.0 02/14/2023   GLUCOSE 135 (H) 02/14/2023   CHOL 160 02/14/2023   TRIG 82.0 02/14/2023   HDL 46.80 02/14/2023   LDLDIRECT 122.0 02/15/2010   LDLCALC 97 02/14/2023   ALT 24 02/14/2023   AST 25 02/14/2023   NA 139 02/14/2023   K 4.1 02/14/2023   CL 106 02/14/2023   CREATININE 0.93 02/14/2023   BUN 17 02/14/2023   CO2 24 02/14/2023   TSH 1.99 02/14/2023   INR 1.02 04/18/2010   HGBA1C 7.5 (H) 04/15/2023   MICROALBUR 0.9 04/15/2023   BP Readings from Last 3 Encounters:  08/13/23 110/80  05/01/23 94/64  02/14/23 100/70  Hg A1c is 5.9% today   ASSESSMENT AND PLAN:  Discussed the following assessment and plan:  Arthritis of right hip  Pre-operative clearance - Plan: POC HgB A1c  Influenza vaccine needed - Plan: Flu vaccine trivalent PF, 6mos and older(Flulaval,Afluria,Fluarix,Fluzone)  Controlled type 2 diabetes mellitus with complication, without long-term current use of insulin (HCC) - improved on GLP1  had se of metformin  increase to 1 mg per week ozempic.  Medication management  OSA (obstructive sleep apnea)  Hypothyroidism, unspecified type - replaced.  No sig CI to surgery .  Medical   optimized and no CV sx or concerns  Form completed and signed . -Patient advised to return or notify health care team  if  new concerns arise.  Patient Instructions  Good to see  you today . Will send in from for optimization to your surgeon.  A1c is much better  5.9%.  Plan increase dose of ozempic to 1 mg per week ( I sent in rx)  Plan  rov in 4 months ( will check meds and A1c ) or as  needed    Burna Mortimer K. Keijuan Schellhase M.D.

## 2023-08-14 LAB — POCT GLYCOSYLATED HEMOGLOBIN (HGB A1C): Hemoglobin A1C: 5.9 % — AB (ref 4.0–5.6)

## 2023-08-16 NOTE — Progress Notes (Signed)
Resulted A1c.

## 2023-08-21 ENCOUNTER — Ambulatory Visit: Payer: 59 | Admitting: Internal Medicine

## 2023-10-01 NOTE — Progress Notes (Signed)
Sent message, via epic in basket, requesting orders in epic from surgeon.  

## 2023-10-04 NOTE — Progress Notes (Signed)
Second request for pre op orders in [facility]: spoke with Tresa Endo.

## 2023-10-07 NOTE — Patient Instructions (Signed)
SURGICAL WAITING ROOM VISITATION  Patients having surgery or a procedure may have no more than 2 support people in the waiting area - these visitors may rotate.    Children under the age of 55 must have an adult with them who is not the patient.  Due to an increase in RSV and influenza rates and associated hospitalizations, children ages 42 and under may not visit patients in [facility] hospitals.  If the patient needs to stay at the hospital during part of their recovery, the visitor guidelines for inpatient rooms apply. Pre-op nurse will coordinate an appropriate time for 1 support person to accompany patient in pre-op.  This support person may not rotate.    Please refer to the [facility] website for the visitor guidelines for Inpatients (after your surgery is over and you are in a regular room).    Your procedure is scheduled on: 10/21/23   Report to [facility] Main Entrance    Report to admitting at 5:15 AM   Call this number if you have problems the morning of surgery [phone]   Do not eat food :After Midnight.   After Midnight you may have the following liquids until 4:30 AM DAY OF SURGERY  Water Non-Citrus Juices (without pulp, NO RED-Apple, White grape, White cranberry) Black Coffee (NO MILK/CREAM OR CREAMERS, sugar ok)  Clear Tea (NO MILK/CREAM OR CREAMERS, sugar ok) regular and decaf                             Plain Jell-O (NO RED)                                           Fruit ices (not with fruit pulp, NO RED)                                     Popsicles (NO RED)                                                               Sports drinks like Gatorade (NO RED)               The day of surgery:  Drink ONE (1) Pre-Surgery G2 at 4:30 AM the morning of surgery. Drink in one sitting. Do not sip.  This drink was given to you during your hospital  pre-op appointment visit. Nothing else to drink after completing the  Pre-Surgery G2.          If you have  questions, please contact your surgeon's office.   FOLLOW BOWEL PREP AND ANY ADDITIONAL PRE OP INSTRUCTIONS YOU RECEIVED FROM YOUR SURGEON'S OFFICE!!!     Oral Hygiene is also important to reduce your risk of infection.                                    Remember - BRUSH YOUR TEETH THE MORNING OF SURGERY WITH YOUR REGULAR TOOTHPASTE  DENTURES WILL BE REMOVED PRIOR TO  SURGERY PLEASE DO NOT APPLY "Poly grip" OR ADHESIVES!!!   Stop all vitamins and herbal supplements 7 days before surgery.   Take these medicines the morning of surgery with A SIP OF WATER: Tylenol, Levothyroxine   Bring CPAP mask and tubing day of surgery.                              You may not have any metal on your body including hair pins, jewelry, and body piercing             Do not wear make-up, lotions, powders, perfumes/cologne, or deodorant  Do not wear nail polish including gel and S&S, artificial/acrylic nails, or any other type of covering on natural nails including finger and toenails. If you have artificial nails, gel coating, etc. that needs to be removed by a nail salon please have this removed prior to surgery or surgery may need to be canceled/ delayed if the surgeon/ anesthesia feels like they are unable to be safely monitored.   Do not shave  48 hours prior to surgery.    Do not bring valuables to the hospital. [facility] IS NOT             RESPONSIBLE   FOR VALUABLES.   Contacts, glasses, dentures or bridgework may not be worn into surgery.  DO NOT BRING YOUR HOME MEDICATIONS TO THE HOSPITAL. PHARMACY WILL DISPENSE MEDICATIONS LISTED ON YOUR MEDICATION LIST TO YOU DURING YOUR ADMISSION IN THE HOSPITAL!    Patients discharged on the day of surgery will not be allowed to drive home.  Someone NEEDS to stay with you for the first 24 hours after anesthesia.              Please read over the following fact sheets you were given: IF YOU HAVE QUESTIONS ABOUT YOUR PRE-OP INSTRUCTIONS PLEASE CALL  [phone] Contras   If you received a COVID test during your pre-op visit  it is requested that you wear a mask when out in public, stay away from anyone that may not be feeling well and notify your surgeon if you develop symptoms. If you test positive for Covid or have been in contact with anyone that has tested positive in the last 10 days please notify you surgeon.      Pre-operative 5 CHG Bath Instructions   You can play a key role in reducing the risk of infection after surgery. Your skin needs to be as free of germs as possible. You can reduce the number of germs on your skin by washing with CHG (chlorhexidine gluconate) soap before surgery. CHG is an antiseptic soap that kills germs and continues to kill germs even after washing.   DO NOT use if you have an allergy to chlorhexidine/CHG or antibacterial soaps. If your skin becomes reddened or irritated, stop using the CHG and notify one of our RNs at [phone].   Please shower with the CHG soap starting 4 days before surgery using the following schedule:     Please keep in mind the following:  DO NOT shave, including legs and underarms, starting the day of your first shower.   You may shave your face at any point before/day of surgery.  Place clean sheets on your bed the day you start using CHG soap. Use a clean washcloth (not used since being washed) for each shower. DO NOT sleep with pets once you start using the CHG.  CHG Shower Instructions:  If you choose to wash your hair and private area, wash first with your normal shampoo/soap.  After you use shampoo/soap, rinse your hair and body thoroughly to remove shampoo/soap residue.  Turn the water OFF and apply about 3 tablespoons (45 ml) of CHG soap to a CLEAN washcloth.  Apply CHG soap ONLY FROM YOUR NECK DOWN TO YOUR TOES (washing for 3-5 minutes)  DO NOT use CHG soap on face, private areas, open wounds, or sores.  Pay special attention to the area where your surgery is  being performed.  If you are having back surgery, having someone wash your back for you may be helpful. Wait 2 minutes after CHG soap is applied, then you may rinse off the CHG soap.  Pat dry with a clean towel  Put on clean clothes/pajamas   If you choose to wear lotion, please use ONLY the CHG-compatible lotions on the back of this paper.     Additional instructions for the day of surgery: DO NOT APPLY any lotions, deodorants, cologne, or perfumes.   Put on clean/comfortable clothes.  Brush your teeth.  Ask your nurse before applying any prescription medications to the skin.      CHG Compatible Lotions   Aveeno Moisturizing lotion  Cetaphil Moisturizing Cream  Cetaphil Moisturizing Lotion  Clairol Herbal Essence Moisturizing Lotion, Dry Skin  Clairol Herbal Essence Moisturizing Lotion, Extra Dry Skin  Clairol Herbal Essence Moisturizing Lotion, Normal Skin  Curel Age Defying Therapeutic Moisturizing Lotion with Alpha Hydroxy  Curel Extreme Care Body Lotion  Curel Soothing Hands Moisturizing Hand Lotion  Curel Therapeutic Moisturizing Cream, Fragrance-Free  Curel Therapeutic Moisturizing Lotion, Fragrance-Free  Curel Therapeutic Moisturizing Lotion, Original Formula  Eucerin Daily Replenishing Lotion  Eucerin Dry Skin Therapy Plus Alpha Hydroxy Crme  Eucerin Dry Skin Therapy Plus Alpha Hydroxy Lotion  Eucerin Original Crme  Eucerin Original Lotion  Eucerin Plus Crme Eucerin Plus Lotion  Eucerin TriLipid Replenishing Lotion  Keri Anti-Bacterial Hand Lotion  Keri Deep Conditioning Original Lotion Dry Skin Formula Softly Scented  Keri Deep Conditioning Original Lotion, Fragrance Free Sensitive Skin Formula  Keri Lotion Fast Absorbing Fragrance Free Sensitive Skin Formula  Keri Lotion Fast Absorbing Softly Scented Dry Skin Formula  Keri Original Lotion  Keri Skin Renewal Lotion Keri Silky Smooth Lotion  Keri Silky Smooth Sensitive Skin Lotion  Nivea Body Creamy  Conditioning Oil  Nivea Body Extra Enriched Teacher, adult education Moisturizing Lotion Nivea Crme  Nivea Skin Firming Lotion  NutraDerm 30 Skin Lotion  NutraDerm Skin Lotion  NutraDerm Therapeutic Skin Cream  NutraDerm Therapeutic Skin Lotion  ProShield Protective Hand Cream  Provon moisturizing lotion  WHAT IS A BLOOD TRANSFUSION? Blood Transfusion Information  A transfusion is the replacement of blood or some of its parts. Blood is made up of multiple cells which provide different functions. Red blood cells carry oxygen and are used for blood loss replacement. White blood cells fight against infection. Platelets control bleeding. Plasma helps clot blood. Other blood products are available for specialized needs, such as hemophilia or other clotting disorders. BEFORE THE TRANSFUSION  Who gives blood for transfusions?  Healthy volunteers who are fully evaluated to make sure their blood is safe. This is blood bank blood. Transfusion therapy is the safest it has ever been in the practice of medicine. Before blood is taken from a donor, a complete history is taken to make sure that person has no history of diseases nor  engages in risky social behavior (examples are intravenous drug use or sexual activity with multiple partners). The donor's travel history is screened to minimize risk of transmitting infections, such as malaria. The donated blood is tested for signs of infectious diseases, such as HIV and hepatitis. The blood is then tested to be sure it is compatible with you in order to minimize the chance of a transfusion reaction. If you or a relative donates blood, this is often done in anticipation of surgery and is not appropriate for emergency situations. It takes many days to process the donated blood. RISKS AND COMPLICATIONS Although transfusion therapy is very safe and saves many lives, the main dangers of transfusion include:  Getting an infectious  disease. Developing a transfusion reaction. This is an allergic reaction to something in the blood you were given. Every precaution is taken to prevent this. The decision to have a blood transfusion has been considered carefully by your caregiver before blood is given. Blood is not given unless the benefits outweigh the risks. AFTER THE TRANSFUSION Right after receiving a blood transfusion, you will usually feel much better and more energetic. This is especially true if your red blood cells have gotten low (anemic). The transfusion raises the level of the red blood cells which carry oxygen, and this usually causes an energy increase. The nurse administering the transfusion will monitor you carefully for complications. HOME CARE INSTRUCTIONS  No special instructions are needed after a transfusion. You may find your energy is better. Speak with your caregiver about any limitations on activity for underlying diseases you may have. SEEK MEDICAL CARE IF:  Your condition is not improving after your transfusion. You develop redness or irritation at the intravenous (IV) site. SEEK IMMEDIATE MEDICAL CARE IF:  Any of the following symptoms occur over the next 12 hours: Shaking chills. You have a temperature by mouth above 102 F (38.9 C), not controlled by medicine. Chest, back, or muscle pain. People around you feel you are not acting correctly or are confused. Shortness of breath or difficulty breathing. Dizziness and fainting. You get a rash or develop hives. You have a decrease in urine output. Your urine turns a dark color or changes to pink, red, or brown. Any of the following symptoms occur over the next 10 days: You have a temperature by mouth above 102 F (38.9 C), not controlled by medicine. Shortness of breath. Weakness after normal activity. The white part of the eye turns yellow (jaundice). You have a decrease in the amount of urine or are urinating less often. Your urine turns a  dark color or changes to pink, red, or brown. Document Released: 09/28/2000 Document Revised: 12/24/2011 Document Reviewed: 05/17/2008 [facility] Patient Information 2014 [place], [place].  _______________________________________________________________________

## 2023-10-07 NOTE — Progress Notes (Signed)
COVID Vaccine Completed:  Date of COVID positive in last 90 days:  PCP - Berniece Andreas, MD Cardiologist - Rollene Rotunda, MD LOV 10/23/19  Chest x-ray -  EKG -  Stress Test -  ECHO - 10/22/18 Epic Cardiac Cath -  Pacemaker/ICD device last checked: Spinal Cord Stimulator:  Bowel Prep -   Sleep Study -  CPAP -   Fasting Blood Sugar -  Checks Blood Sugar _____ times a day  Last dose of GLP1 agonist-  N/A GLP1 instructions:  Hold 7 days before surgery    Last dose of SGLT-2 inhibitors-  N/A SGLT-2 instructions:  Hold 3 days before surgery    Blood Thinner Instructions:  Time Aspirin Instructions: Last Dose:  Activity level:  Can go up a flight of stairs and perform activities of daily living without stopping and without symptoms of chest pain or shortness of breath.  Able to exercise without symptoms  Unable to go up a flight of stairs without symptoms of     Anesthesia review:   Patient denies shortness of breath, fever, cough and chest pain at PAT appointment  Patient verbalized understanding of instructions that were given to them at the PAT appointment. Patient was also instructed that they will need to review over the PAT instructions again at home before surgery.

## 2023-10-07 NOTE — Progress Notes (Signed)
Please place PAT orders for appointment scheduled 10/10/23.

## 2023-10-08 ENCOUNTER — Ambulatory Visit: Payer: Self-pay | Admitting: Emergency Medicine

## 2023-10-08 DIAGNOSIS — G8929 Other chronic pain: Secondary | ICD-10-CM

## 2023-10-08 NOTE — H&P (Signed)
TOTAL HIP ADMISSION H&P  Patient is admitted for right total hip arthroplasty.  Subjective:  Chief Complaint: right hip pain  HPI: Angelica Gray, 62 y.o. female, has a history of pain and functional disability in the right hip(s) due to arthritis and patient has failed non-surgical conservative treatments for greater than 12 weeks to include NSAID's and/or analgesics, supervised PT with diminished ADL's post treatment, and activity modification.  Onset of symptoms was gradual starting 2 years ago with gradually worsening course since that time.The patient noted no past surgery on the right hip(s).  Patient currently rates pain in the right hip at 8 out of 10 with activity. Patient has night pain, worsening of pain with activity and weight bearing, pain that interfers with activities of daily living, and pain with passive range of motion. Patient has evidence of periarticular osteophytes and joint space narrowing by imaging studies. This condition presents safety issues increasing the risk of falls.  There is no current active infection.  Patient Active Problem List   Diagnosis Date Noted   Visit for preventive health examination 02/13/2023   Cough 08/29/2022   Wheezing 08/29/2022   Dyspnea due to COVID-19 11/05/2019   Goiter 12/08/2015   OSA (obstructive sleep apnea) 11/17/2015   Morbid obesity (HCC) 11/17/2015   History of repair of hiatal hernia 09/05/2015   Hyperglycemia 06/11/2015   Atrophic kidney    Sheria Lang lesion, chronic 02/01/2015   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 02/01/2015   Hiatal hernia 05/17/2010   CHEST WALL PAIN, ANTERIOR 05/17/2010   Vitamin D deficiency 02/15/2010   HYPOGLYCEMIA 11/15/2009   HYDRONEPHROSIS, RIGHT 10/22/2008   Other and unspecified hyperlipidemia 11/07/2007   RESTLESS LEG SYNDROME, MILD 11/07/2007   MYOFASCIAL PAIN SYNDROME 11/07/2007   LIVER FUNCTION TESTS, ABNORMAL 11/07/2007   Hypothyroidism 09/23/2007    Menopausal and postmenopausal disorder 09/23/2007   MALAISE AND FATIGUE 09/23/2007   Past Medical History:  Diagnosis Date   Arthritis    Atrophic kidney left   History of hiatal hernia    History of renal stone 2004   Hyperlipidemia    Hypothyroidism    Insomnia    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 02/01/2015   RESTLESS LEG SYNDROME, MILD 11/07/2007   Qualifier: Diagnosis of  By: Lovell Sheehan MD, Balinda Quails    Sleep apnea    no CPAP     Past Surgical History:  Procedure Laterality Date   COLONOSCOPY     ESOPHAGOGASTRODUODENOSCOPY     HIATAL HERNIA REPAIR N/A 09/05/2015   Procedure: LAPAROSCOPIC REPAIR OF HIATAL HERNIA;  Surgeon: Ovidio Kin, MD;  Location: [facility];  Service: General;  Laterality: N/A;   KIDNEY STONE SURGERY     MANDIBLE FRACTURE SURGERY     REFRACTIVE SURGERY     TUBAL LIGATION  2001    Current Outpatient Medications  Medication Sig Dispense Refill Last Dose/Taking   acetaminophen (TYLENOL) 650 MG CR tablet Take 1,300 mg by mouth every 8 (eight) hours as needed for pain.      albuterol (VENTOLIN HFA) 108 (90 Base) MCG/ACT inhaler Inhale 2 puffs into the lungs every 6 (six) hours as needed for wheezing or shortness of breath. (Patient not taking: Reported on 10/02/2023) 8 g 1    Ascorbic Acid (VITAMIN C) 1000 MG tablet Take 1,000 mg by mouth daily.      Cholecalciferol 50 MCG (2000 UT) CAPS Take 2,000 Units by mouth daily.  Cyanocobalamin (VITAMIN B 12 PO) Take 1 tablet by mouth daily.      fluticasone (FLONASE) 50 MCG/ACT nasal spray Place 2 sprays into both nostrils daily. (Patient taking differently: Place 2 sprays into both nostrils daily as needed for allergies.) 16 g 6    levothyroxine (SYNTHROID) 112 MCG tablet Take 1 tablet (112 mcg total) by mouth daily before breakfast. TAKE 1 TABLET BY MOUTH ONCE DAILY BEFORE BREAKFAST. 90 tablet 1    naproxen (NAPROSYN) 500 MG tablet TAKE 1 TABLET BY MOUTH TWICE DAILY WITH MEALS AS NEEDED  FOR  JOINT  PAIN 30 tablet 0    NON FORMULARY Pt uses a cpap nightly      omeprazole (PRILOSEC) 20 MG capsule Take 1 capsule (20 mg total) by mouth daily. (Patient not taking: Reported on 08/13/2023) 30 capsule 3    Semaglutide, 1 MG/DOSE, 4 MG/3ML SOPN Inject 1 mg as directed once a week. Dosage change 3 mL 3    No current facility-administered medications for this visit.   No Known Allergies  Social History   Tobacco Use   Smoking status: Never   Smokeless tobacco: Never  Substance Use Topics   Alcohol use: No    Alcohol/week: 0.0 standard drinks of alcohol    Family History  Problem Relation Age of Onset   Kidney failure Mother        from dm and ht  had 11 children    Hyperlipidemia Mother    Diabetes Mother    Thyroid disease Mother    Diabetes Father    Heart attack Father        dec age 77   Throat cancer Paternal Grandmother        dipped stuff   Esophageal cancer Paternal Grandmother    Thyroid disease Sister    Colon cancer Neg Hx    Stomach cancer Neg Hx      Review of Systems  Musculoskeletal:  Positive for arthralgias.  All other systems reviewed and are negative.   Objective:  Physical Exam Constitutional:      General: She is not in acute distress.    Appearance: Normal appearance. She is not ill-appearing.  HENT:     Head: Normocephalic and atraumatic.     Right Ear: External ear normal.     Left Ear: External ear normal.     Nose: Nose normal.     Mouth/Throat:     Mouth: Mucous membranes are moist.     Pharynx: Oropharynx is clear.  Eyes:     Extraocular Movements: Extraocular movements intact.     Conjunctiva/sclera: Conjunctivae normal.  Cardiovascular:     Rate and Rhythm: Normal rate and regular rhythm.     Pulses: Normal pulses.     Heart sounds: Normal heart sounds.  Pulmonary:     Effort: Pulmonary effort is normal.     Breath sounds: Normal breath sounds.  Abdominal:     General: Bowel sounds are normal.     Palpations:  Abdomen is soft.     Tenderness: There is no abdominal tenderness.  Musculoskeletal:        General: Tenderness present.     Cervical back: Normal range of motion and neck supple.     Comments: TTP over groin, lateral aspect, greater trochanter.  Mild IT band tenderness.  No significant swelling.  No overlying lesions of area of chief complaint.  Decreased strength and ROM due to elicited pain.  Dorsiflexion and plantarflexion intact.  BLE appear grossly neurovascularly intact.  Gait mildly antalgic.   Skin:    General: Skin is warm and dry.  Neurological:     Mental Status: She is alert and oriented to person, place, and time. Mental status is at baseline.  Psychiatric:        Mood and Affect: Mood normal.        Behavior: Behavior normal.     Vital signs in last 24 hours: @VSRANGES @  Labs:   Estimated body mass index is 36.3 kg/m as calculated from the following:   Height as of 08/13/23: 5' 5.25" (1.657 m).   Weight as of 08/13/23: 99.7 kg.   Imaging Review Plain radiographs demonstrate severe degenerative joint disease of the right hip(s). The bone quality appears to be fair for age and reported activity level.      Assessment/Plan:  End stage arthritis, right hip(s)  The patient history, physical examination, clinical judgement of the provider and imaging studies are consistent with end stage degenerative joint disease of the right hip(s) and total hip arthroplasty is deemed medically necessary. The treatment options including medical management, injection therapy, arthroscopy and arthroplasty were discussed at length. The risks and benefits of total hip arthroplasty were presented and reviewed. The risks due to aseptic loosening, infection, stiffness, dislocation/subluxation,  thromboembolic complications and other imponderables were discussed.  The patient acknowledged the explanation, agreed to proceed with the plan and consent was signed. Patient is being admitted for  inpatient treatment for surgery, pain control, PT, OT, prophylactic antibiotics, VTE prophylaxis, progressive ambulation and ADL's and discharge planning.The patient is planning to be discharged with outpatient PT.    Patient's anticipated LOS is less than 2 midnights, meeting these requirements: - Younger than 32 - Lives within 1 hour of care - Has a competent adult at home to recover with post-op recover - NO history of  - Chronic pain requiring opiods  - Diabetes  - Coronary Artery Disease  - Heart failure  - Heart attack  - Stroke  - DVT/VTE  - Cardiac arrhythmia  - Respiratory Failure/COPD  - Renal failure  - Anemia  - Advanced Liver disease

## 2023-10-10 ENCOUNTER — Encounter (HOSPITAL_COMMUNITY)
Admission: RE | Admit: 2023-10-10 | Discharge: 2023-10-10 | Disposition: A | Discharge status: Home / Self Care | Payer: 59 | Source: Ambulatory Visit | Attending: Orthopedic Surgery | Admitting: Orthopedic Surgery

## 2023-10-10 ENCOUNTER — Encounter (HOSPITAL_COMMUNITY): Payer: Self-pay

## 2023-10-10 ENCOUNTER — Other Ambulatory Visit: Payer: Self-pay

## 2023-10-10 VITALS — BP 120/86 | HR 71 | Temp 97.8°F | Resp 16 | Ht 66.0 in | Wt 210.0 lb

## 2023-10-10 DIAGNOSIS — E119 Type 2 diabetes mellitus without complications: Secondary | ICD-10-CM | POA: Insufficient documentation

## 2023-10-10 DIAGNOSIS — G8929 Other chronic pain: Secondary | ICD-10-CM | POA: Insufficient documentation

## 2023-10-10 DIAGNOSIS — Z01818 Encounter for other preprocedural examination: Secondary | ICD-10-CM | POA: Insufficient documentation

## 2023-10-10 DIAGNOSIS — M25551 Pain in right hip: Secondary | ICD-10-CM | POA: Insufficient documentation

## 2023-10-10 HISTORY — DX: Type 2 diabetes mellitus without complications: E11.9

## 2023-10-10 HISTORY — DX: Pneumonia, unspecified organism: J18.9

## 2023-10-10 LAB — COMPREHENSIVE METABOLIC PANEL
ALT: 20 U/L (ref 0–44)
AST: 21 U/L (ref 15–41)
Albumin: 4.1 g/dL (ref 3.5–5.0)
Alkaline Phosphatase: 115 U/L (ref 38–126)
Anion gap: 8 (ref 5–15)
BUN: 19 mg/dL (ref 8–23)
CO2: 26 mmol/L (ref 22–32)
Calcium: 9.3 mg/dL (ref 8.9–10.3)
Chloride: 105 mmol/L (ref 98–111)
Creatinine, Ser: 0.91 mg/dL (ref 0.44–1.00)
GFR, Estimated: >60 mL/min (ref >60)
Glucose, Bld: 95 mg/dL (ref 70–99)
Potassium: 4.3 mmol/L (ref 3.5–5.1)
Sodium: 139 mmol/L (ref 135–145)
Total Bilirubin: 0.7 mg/dL (ref <1.2)
Total Protein: 7.5 g/dL (ref 6.5–8.1)

## 2023-10-10 LAB — CBC WITH DIFFERENTIAL/PLATELET
Abs Immature Granulocytes: 0.01 10*3/uL (ref 0.00–0.07)
Basophils Absolute: 0 10*3/uL (ref 0.0–0.1)
Basophils Relative: 1 %
Eosinophils Absolute: 0.1 10*3/uL (ref 0.0–0.5)
Eosinophils Relative: 2 %
HCT: 38 % (ref 36.0–46.0)
Hemoglobin: 12.9 g/dL (ref 12.0–15.0)
Immature Granulocytes: 0 %
Lymphocytes Relative: 35 %
Lymphs Abs: 2.2 10*3/uL (ref 0.7–4.0)
MCH: 30.9 pg (ref 26.0–34.0)
MCHC: 33.9 g/dL (ref 30.0–36.0)
MCV: 90.9 fL (ref 80.0–100.0)
Monocytes Absolute: 0.4 10*3/uL (ref 0.1–1.0)
Monocytes Relative: 6 %
Neutro Abs: 3.5 10*3/uL (ref 1.7–7.7)
Neutrophils Relative %: 56 %
Platelets: 198 10*3/uL (ref 150–400)
RBC: 4.18 MIL/uL (ref 3.87–5.11)
RDW: 12.5 % (ref 11.5–15.5)
WBC: 6.3 10*3/uL (ref 4.0–10.5)
nRBC: 0 % (ref 0.0–0.2)

## 2023-10-10 LAB — TYPE AND SCREEN
ABO/RH(D): A POS
Antibody Screen: NEGATIVE

## 2023-10-10 LAB — GLUCOSE, CAPILLARY: Glucose-Capillary: 90 mg/dL (ref 70–99)

## 2023-10-11 LAB — SURGICAL PCR SCREEN
MRSA, PCR: POSITIVE — AB
Staphylococcus aureus: POSITIVE — AB

## 2023-10-11 LAB — HEMOGLOBIN A1C
Hgb A1c MFr Bld: 6 % — ABNORMAL HIGH (ref 4.8–5.6)
Mean Plasma Glucose: 126 mg/dL

## 2023-10-11 NOTE — Progress Notes (Signed)
STAPH+, MRSA+ results routed to Dr. Blanchie Dessert

## 2023-10-21 ENCOUNTER — Ambulatory Visit (HOSPITAL_COMMUNITY): Payer: 59

## 2023-10-21 ENCOUNTER — Encounter (HOSPITAL_COMMUNITY)
Admission: RE | Disposition: A | Discharge status: Home / Self Care | Payer: Self-pay | Source: Home / Self Care | Attending: Orthopedic Surgery

## 2023-10-21 ENCOUNTER — Other Ambulatory Visit: Payer: Self-pay

## 2023-10-21 ENCOUNTER — Ambulatory Visit (HOSPITAL_COMMUNITY)
Admission: RE | Admit: 2023-10-21 | Discharge: 2023-10-21 | Disposition: A | Discharge status: Home / Self Care | Payer: 59 | Attending: Orthopedic Surgery | Admitting: Orthopedic Surgery

## 2023-10-21 ENCOUNTER — Encounter (HOSPITAL_COMMUNITY): Payer: Self-pay | Admitting: Orthopedic Surgery

## 2023-10-21 ENCOUNTER — Ambulatory Visit (HOSPITAL_COMMUNITY): Payer: 59 | Admitting: Anesthesiology

## 2023-10-21 DIAGNOSIS — Z01818 Encounter for other preprocedural examination: Secondary | ICD-10-CM

## 2023-10-21 DIAGNOSIS — Z7985 Long-term (current) use of injectable non-insulin antidiabetic drugs: Secondary | ICD-10-CM | POA: Insufficient documentation

## 2023-10-21 DIAGNOSIS — Z8711 Personal history of peptic ulcer disease: Secondary | ICD-10-CM | POA: Diagnosis not present

## 2023-10-21 DIAGNOSIS — E119 Type 2 diabetes mellitus without complications: Secondary | ICD-10-CM | POA: Diagnosis not present

## 2023-10-21 DIAGNOSIS — E039 Hypothyroidism, unspecified: Secondary | ICD-10-CM | POA: Diagnosis not present

## 2023-10-21 DIAGNOSIS — G4733 Obstructive sleep apnea (adult) (pediatric): Secondary | ICD-10-CM

## 2023-10-21 DIAGNOSIS — M1611 Unilateral primary osteoarthritis, right hip: Secondary | ICD-10-CM | POA: Diagnosis present

## 2023-10-21 DIAGNOSIS — G4730 Sleep apnea, unspecified: Secondary | ICD-10-CM | POA: Insufficient documentation

## 2023-10-21 HISTORY — PX: TOTAL HIP ARTHROPLASTY: SHX124

## 2023-10-21 LAB — CBC
HCT: 39 % (ref 36.0–46.0)
Hemoglobin: 13 g/dL (ref 12.0–15.0)
MCH: 31.1 pg (ref 26.0–34.0)
MCHC: 33.3 g/dL (ref 30.0–36.0)
MCV: 93.3 fL (ref 80.0–100.0)
Platelets: 182 10*3/uL (ref 150–400)
RBC: 4.18 MIL/uL (ref 3.87–5.11)
RDW: 12.8 % (ref 11.5–15.5)
WBC: 11 10*3/uL — ABNORMAL HIGH (ref 4.0–10.5)
nRBC: 0 % (ref 0.0–0.2)

## 2023-10-21 LAB — BASIC METABOLIC PANEL
Anion gap: 8 (ref 5–15)
BUN: 14 mg/dL (ref 8–23)
CO2: 24 mmol/L (ref 22–32)
Calcium: 9 mg/dL (ref 8.9–10.3)
Chloride: 108 mmol/L (ref 98–111)
Creatinine, Ser: 0.98 mg/dL (ref 0.44–1.00)
GFR, Estimated: >60 mL/min (ref >60)
Glucose, Bld: 168 mg/dL — ABNORMAL HIGH (ref 70–99)
Potassium: 4.1 mmol/L (ref 3.5–5.1)
Sodium: 140 mmol/L (ref 135–145)

## 2023-10-21 LAB — GLUCOSE, CAPILLARY
Glucose-Capillary: 103 mg/dL — ABNORMAL HIGH (ref 70–99)
Glucose-Capillary: 141 mg/dL — ABNORMAL HIGH (ref 70–99)

## 2023-10-21 LAB — ABO/RH: ABO/RH(D): A POS

## 2023-10-21 SURGERY — ARTHROPLASTY, HIP, TOTAL,POSTERIOR APPROACH
Anesthesia: Monitor Anesthesia Care | Site: Hip | Laterality: Right

## 2023-10-21 MED ORDER — GLYCOPYRROLATE 0.2 MG/ML IJ SOLN
INTRAMUSCULAR | Status: DC | PRN
  Administered 2023-10-21: .2 mg via INTRAVENOUS

## 2023-10-21 MED ORDER — SODIUM CHLORIDE 0.9 % IV SOLN: INTRAVENOUS | Status: DC

## 2023-10-21 MED ORDER — DEXAMETHASONE SODIUM PHOSPHATE 10 MG/ML IJ SOLN
8.0000 mg | Freq: Once | INTRAMUSCULAR | Status: AC
  Administered 2023-10-21: 4 mg via INTRAVENOUS

## 2023-10-21 MED ORDER — PROPOFOL 1000 MG/100ML IV EMUL
INTRAVENOUS | Status: AC
  Filled 2023-10-21: qty 100

## 2023-10-21 MED ORDER — MIDAZOLAM HCL 5 MG/5ML IJ SOLN
INTRAMUSCULAR | Status: DC | PRN
  Administered 2023-10-21: 2 mg via INTRAVENOUS

## 2023-10-21 MED ORDER — OXYCODONE HCL 5 MG PO TABS
5.0000 mg | ORAL_TABLET | Freq: Once | ORAL | Status: AC | PRN
Start: 2023-10-21 — End: 2023-10-21
  Administered 2023-10-21: 5 mg via ORAL

## 2023-10-21 MED ORDER — LACTATED RINGERS IV BOLUS
250.0000 mL | Freq: Once | INTRAVENOUS | Status: AC
  Administered 2023-10-21: 250 mL via INTRAVENOUS

## 2023-10-21 MED ORDER — 0.9 % SODIUM CHLORIDE (POUR BTL) OPTIME
TOPICAL | Status: DC | PRN
  Administered 2023-10-21: 1000 mL

## 2023-10-21 MED ORDER — METHOCARBAMOL 500 MG PO TABS: 500.0000 mg | ORAL_TABLET | Freq: Three times a day (TID) | ORAL | 0 refills | Status: AC | PRN

## 2023-10-21 MED ORDER — METHOCARBAMOL 500 MG PO TABS
ORAL_TABLET | ORAL | Status: AC
  Administered 2023-10-21: 500 mg via ORAL
  Filled 2023-10-21: qty 1

## 2023-10-21 MED ORDER — CHLORHEXIDINE GLUCONATE 0.12 % MT SOLN
15.0000 mL | Freq: Once | OROMUCOSAL | Status: AC
  Administered 2023-10-21: 15 mL via OROMUCOSAL

## 2023-10-21 MED ORDER — SODIUM CHLORIDE (PF) 0.9 % IJ SOLN
INTRAMUSCULAR | Status: DC | PRN
  Administered 2023-10-21: 30 mL

## 2023-10-21 MED ORDER — PHENYLEPHRINE HCL-NACL 20-0.9 MG/250ML-% IV SOLN
INTRAVENOUS | Status: AC
  Filled 2023-10-21: qty 250

## 2023-10-21 MED ORDER — TRANEXAMIC ACID-NACL 1000-0.7 MG/100ML-% IV SOLN
1000.0000 mg | INTRAVENOUS | Status: AC
  Administered 2023-10-21: 1000 mg via INTRAVENOUS
  Filled 2023-10-21: qty 100

## 2023-10-21 MED ORDER — ACETAMINOPHEN 325 MG PO TABS: 325.0000 mg | ORAL_TABLET | Freq: Four times a day (QID) | ORAL | Status: DC | PRN

## 2023-10-21 MED ORDER — POVIDONE-IODINE 10 % EX SWAB: 2.0000 | Freq: Once | CUTANEOUS | Status: DC

## 2023-10-21 MED ORDER — BUPIVACAINE LIPOSOME 1.3 % IJ SUSP
INTRAMUSCULAR | Status: DC | PRN
  Administered 2023-10-21: 20 mL

## 2023-10-21 MED ORDER — POLYETHYLENE GLYCOL 3350 17 G PO PACK: 17.0000 g | PACK | Freq: Every day | ORAL | Status: DC | PRN

## 2023-10-21 MED ORDER — OXYCODONE HCL 5 MG PO TABS
ORAL_TABLET | ORAL | Status: AC
  Filled 2023-10-21: qty 1

## 2023-10-21 MED ORDER — SENNA 8.6 MG PO TABS: 1.0000 | ORAL_TABLET | Freq: Two times a day (BID) | ORAL | Status: DC

## 2023-10-21 MED ORDER — CHLORHEXIDINE GLUCONATE 4 % EX SOLN: 1.0000 | CUTANEOUS | 1 refills | Status: AC

## 2023-10-21 MED ORDER — SODIUM CHLORIDE (PF) 0.9 % IJ SOLN
INTRAMUSCULAR | Status: AC
  Filled 2023-10-21: qty 20

## 2023-10-21 MED ORDER — ORAL CARE MOUTH RINSE: 15.0000 mL | Freq: Once | OROMUCOSAL | Status: AC

## 2023-10-21 MED ORDER — METHOCARBAMOL 1000 MG/10ML IJ SOLN: 500.0000 mg | Freq: Four times a day (QID) | INTRAMUSCULAR | Status: DC | PRN

## 2023-10-21 MED ORDER — INSULIN ASPART 100 UNIT/ML IJ SOLN
0.0000 [IU] | INTRAMUSCULAR | Status: DC | PRN
Start: 2023-10-21 — End: 2023-10-21

## 2023-10-21 MED ORDER — DOCUSATE SODIUM 100 MG PO CAPS: 100.0000 mg | ORAL_CAPSULE | Freq: Two times a day (BID) | ORAL | Status: DC

## 2023-10-21 MED ORDER — KETOROLAC TROMETHAMINE 30 MG/ML IJ SOLN
INTRAMUSCULAR | Status: AC
  Administered 2023-10-21: 15 mg
  Filled 2023-10-21: qty 1

## 2023-10-21 MED ORDER — SODIUM CHLORIDE (PF) 0.9 % IJ SOLN
INTRAMUSCULAR | Status: AC
  Filled 2023-10-21: qty 10

## 2023-10-21 MED ORDER — ONDANSETRON HCL 4 MG PO TABS: 4.0000 mg | ORAL_TABLET | Freq: Four times a day (QID) | ORAL | Status: DC | PRN

## 2023-10-21 MED ORDER — BUPIVACAINE-EPINEPHRINE 0.25% -1:200000 IJ SOLN
INTRAMUSCULAR | Status: DC | PRN
  Administered 2023-10-21: 30 mL

## 2023-10-21 MED ORDER — HYDROMORPHONE HCL 1 MG/ML IJ SOLN: 0.5000 mg | INTRAMUSCULAR | Status: DC | PRN

## 2023-10-21 MED ORDER — CEFAZOLIN SODIUM-DEXTROSE 2-4 GM/100ML-% IV SOLN
2.0000 g | INTRAVENOUS | Status: AC
  Administered 2023-10-21: 2 g via INTRAVENOUS
  Filled 2023-10-21: qty 100

## 2023-10-21 MED ORDER — LACTATED RINGERS IV SOLN: INTRAVENOUS | Status: DC

## 2023-10-21 MED ORDER — MUPIROCIN 2 % EX OINT: 1.0000 | TOPICAL_OINTMENT | Freq: Two times a day (BID) | CUTANEOUS | 0 refills | Status: AC

## 2023-10-21 MED ORDER — BUPIVACAINE-EPINEPHRINE 0.25% -1:200000 IJ SOLN
INTRAMUSCULAR | Status: AC
  Filled 2023-10-21: qty 1

## 2023-10-21 MED ORDER — PHENOL 1.4 % MT LIQD: 1.0000 | OROMUCOSAL | Status: DC | PRN

## 2023-10-21 MED ORDER — SODIUM CHLORIDE 0.9 % IR SOLN
Status: DC | PRN
  Administered 2023-10-21: 3000 mL

## 2023-10-21 MED ORDER — WATER FOR IRRIGATION, STERILE IR SOLN
Status: DC | PRN
  Administered 2023-10-21: 1000 mL

## 2023-10-21 MED ORDER — PHENYLEPHRINE HCL (PRESSORS) 10 MG/ML IV SOLN
INTRAVENOUS | Status: DC | PRN
  Administered 2023-10-21 (×3): 160 ug via INTRAVENOUS

## 2023-10-21 MED ORDER — LACTATED RINGERS IV BOLUS
500.0000 mL | Freq: Once | INTRAVENOUS | Status: AC
Start: 2023-10-21 — End: 2023-10-21
  Administered 2023-10-21: 500 mL via INTRAVENOUS

## 2023-10-21 MED ORDER — CELECOXIB 100 MG PO CAPS: 100.0000 mg | ORAL_CAPSULE | Freq: Two times a day (BID) | ORAL | 0 refills | Status: AC

## 2023-10-21 MED ORDER — VANCOMYCIN HCL IN DEXTROSE 1-5 GM/200ML-% IV SOLN
1000.0000 mg | Freq: Once | INTRAVENOUS | Status: AC
  Administered 2023-10-21: 1000 mg via INTRAVENOUS
  Filled 2023-10-21: qty 200

## 2023-10-21 MED ORDER — MIDAZOLAM HCL 2 MG/2ML IJ SOLN
INTRAMUSCULAR | Status: AC
  Filled 2023-10-21: qty 2

## 2023-10-21 MED ORDER — BUPIVACAINE IN DEXTROSE 0.75-8.25 % IT SOLN
INTRATHECAL | Status: DC | PRN
  Administered 2023-10-21: 1.8 mL via INTRATHECAL

## 2023-10-21 MED ORDER — OXYCODONE HCL 5 MG PO TABS
5.0000 mg | ORAL_TABLET | ORAL | Status: DC | PRN
Start: 2023-10-21 — End: 2023-10-21

## 2023-10-21 MED ORDER — ACETAMINOPHEN 500 MG PO TABS: 1000.0000 mg | ORAL_TABLET | Freq: Three times a day (TID) | ORAL | Status: AC | PRN

## 2023-10-21 MED ORDER — FENTANYL CITRATE PF 50 MCG/ML IJ SOSY
25.0000 ug | PREFILLED_SYRINGE | INTRAMUSCULAR | Status: DC | PRN
  Administered 2023-10-21: 25 ug via INTRAVENOUS

## 2023-10-21 MED ORDER — POLYETHYLENE GLYCOL 3350 17 G PO PACK: 17.0000 g | PACK | Freq: Every day | ORAL | 0 refills | Status: AC

## 2023-10-21 MED ORDER — FENTANYL CITRATE PF 50 MCG/ML IJ SOSY
PREFILLED_SYRINGE | INTRAMUSCULAR | Status: AC
  Administered 2023-10-21: 25 ug via INTRAVENOUS
  Filled 2023-10-21: qty 1

## 2023-10-21 MED ORDER — DIPHENHYDRAMINE HCL 12.5 MG/5ML PO ELIX: 12.5000 mg | ORAL_SOLUTION | ORAL | Status: DC | PRN

## 2023-10-21 MED ORDER — ONDANSETRON HCL 4 MG/2ML IJ SOLN: 4.0000 mg | Freq: Four times a day (QID) | INTRAMUSCULAR | Status: DC | PRN

## 2023-10-21 MED ORDER — ACETAMINOPHEN 500 MG PO TABS
1000.0000 mg | ORAL_TABLET | Freq: Four times a day (QID) | ORAL | Status: DC
  Administered 2023-10-21: 1000 mg via ORAL

## 2023-10-21 MED ORDER — ASPIRIN 81 MG PO CHEW
81.0000 mg | CHEWABLE_TABLET | Freq: Two times a day (BID) | ORAL | Status: DC
Start: 2023-10-21 — End: 2023-10-21
  Filled 2023-10-21: qty 1

## 2023-10-21 MED ORDER — BUPIVACAINE LIPOSOME 1.3 % IJ SUSP
INTRAMUSCULAR | Status: AC
Start: 2023-10-21 — End: ?
  Filled 2023-10-21: qty 20

## 2023-10-21 MED ORDER — ASPIRIN 81 MG PO TBEC: 81.0000 mg | DELAYED_RELEASE_TABLET | Freq: Two times a day (BID) | ORAL | Status: AC

## 2023-10-21 MED ORDER — PROPOFOL 10 MG/ML IV BOLUS
INTRAVENOUS | Status: DC | PRN
  Administered 2023-10-21: 60 mg via INTRAVENOUS
  Administered 2023-10-21 (×2): 20 mg via INTRAVENOUS

## 2023-10-21 MED ORDER — METHOCARBAMOL 500 MG PO TABS: 500.0000 mg | ORAL_TABLET | Freq: Four times a day (QID) | ORAL | Status: DC | PRN

## 2023-10-21 MED ORDER — ONDANSETRON HCL 4 MG/2ML IJ SOLN
INTRAMUSCULAR | Status: DC | PRN
  Administered 2023-10-21: 4 mg via INTRAVENOUS

## 2023-10-21 MED ORDER — PHENYLEPHRINE HCL-NACL 20-0.9 MG/250ML-% IV SOLN
INTRAVENOUS | Status: DC | PRN
  Administered 2023-10-21: 40 ug/min via INTRAVENOUS

## 2023-10-21 MED ORDER — MENTHOL 3 MG MT LOZG: 1.0000 | LOZENGE | OROMUCOSAL | Status: DC | PRN

## 2023-10-21 MED ORDER — OXYCODONE HCL 5 MG PO TABS: 5.0000 mg | ORAL_TABLET | ORAL | 0 refills | Status: AC | PRN

## 2023-10-21 MED ORDER — AMISULPRIDE (ANTIEMETIC) 5 MG/2ML IV SOLN: 10.0000 mg | Freq: Once | INTRAVENOUS | Status: DC | PRN

## 2023-10-21 MED ORDER — LACTATED RINGERS IV SOLN: INTRAVENOUS | Status: DC | PRN

## 2023-10-21 MED ORDER — PROPOFOL 500 MG/50ML IV EMUL
INTRAVENOUS | Status: DC | PRN
  Administered 2023-10-21: 100 ug/kg/min via INTRAVENOUS

## 2023-10-21 MED ORDER — DEXAMETHASONE SODIUM PHOSPHATE 10 MG/ML IJ SOLN
INTRAMUSCULAR | Status: AC
  Filled 2023-10-21: qty 1

## 2023-10-21 MED ORDER — ONDANSETRON HCL 4 MG PO TABS: 4.0000 mg | ORAL_TABLET | Freq: Three times a day (TID) | ORAL | 0 refills | Status: AC | PRN

## 2023-10-21 MED ORDER — PANTOPRAZOLE SODIUM 40 MG PO TBEC: 40.0000 mg | DELAYED_RELEASE_TABLET | Freq: Every day | ORAL | Status: DC

## 2023-10-21 MED ORDER — CEFAZOLIN SODIUM-DEXTROSE 2-4 GM/100ML-% IV SOLN: 2.0000 g | Freq: Four times a day (QID) | INTRAVENOUS | Status: DC

## 2023-10-21 MED ORDER — ACETAMINOPHEN 500 MG PO TABS
1000.0000 mg | ORAL_TABLET | Freq: Once | ORAL | Status: AC
  Administered 2023-10-21: 1000 mg via ORAL
  Filled 2023-10-21: qty 2

## 2023-10-21 MED ORDER — KETOROLAC TROMETHAMINE 15 MG/ML IJ SOLN: 15.0000 mg | Freq: Four times a day (QID) | INTRAMUSCULAR | Status: DC

## 2023-10-21 MED ORDER — OXYCODONE HCL 5 MG PO TABS
ORAL_TABLET | ORAL | Status: AC
  Administered 2023-10-21: 5 mg via ORAL
  Filled 2023-10-21: qty 1

## 2023-10-21 MED ORDER — OXYCODONE HCL 5 MG/5ML PO SOLN: 5.0000 mg | Freq: Once | ORAL | Status: AC | PRN

## 2023-10-21 MED ORDER — BUPIVACAINE LIPOSOME 1.3 % IJ SUSP: 10.0000 mL | Freq: Once | INTRAMUSCULAR | Status: DC

## 2023-10-21 MED ORDER — ACETAMINOPHEN 500 MG PO TABS
ORAL_TABLET | ORAL | Status: AC
  Filled 2023-10-21: qty 2

## 2023-10-21 MED ORDER — ONDANSETRON HCL 4 MG/2ML IJ SOLN
INTRAMUSCULAR | Status: AC
  Filled 2023-10-21: qty 2

## 2023-10-21 SURGICAL SUPPLY — 67 items
BAG COUNTER SPONGE SURGICOUNT (BAG) IMPLANT
BAG ZIPLOCK 12X15 (MISCELLANEOUS) ×1 IMPLANT
BIT DRILL TRIDENT 4X25 SU (BIT) IMPLANT
BIT DRILL TRIDENT 4X40 SU (BIT) IMPLANT
BLADE SAW SAG 25X90X1.19 (BLADE) ×1 IMPLANT
CEMENT BONE SIMPLEX SPEEDSET (Cement) IMPLANT
CHLORAPREP W/TINT 26 (MISCELLANEOUS) ×2 IMPLANT
CNTNR URN SCR LID CUP LEK RST (MISCELLANEOUS) ×1 IMPLANT
COVER SURGICAL LIGHT HANDLE (MISCELLANEOUS) ×1 IMPLANT
DERMABOND ADVANCED .7 DNX12 (GAUZE/BANDAGES/DRESSINGS) ×1 IMPLANT
DRAPE HIP W/POCKET STRL (MISCELLANEOUS) ×1 IMPLANT
DRAPE INCISE IOBAN 66X45 STRL (DRAPES) ×1 IMPLANT
DRAPE INCISE IOBAN 85X60 (DRAPES) ×1 IMPLANT
DRAPE POUCH INSTRU U-SHP 10X18 (DRAPES) ×1 IMPLANT
DRAPE SHEET LG 3/4 BI-LAMINATE (DRAPES) ×3 IMPLANT
DRAPE U-SHAPE 47X51 STRL (DRAPES) ×2 IMPLANT
DRSG AQUACEL AG ADV 3.5X10 (GAUZE/BANDAGES/DRESSINGS) ×1 IMPLANT
ELECT BLADE TIP CTD 4 INCH (ELECTRODE) ×1 IMPLANT
ELECT REM PT RETURN 15FT ADLT (MISCELLANEOUS) ×1 IMPLANT
GAUZE SPONGE 4X4 12PLY STRL (GAUZE/BANDAGES/DRESSINGS) ×1 IMPLANT
GLOVE BIO SURGEON STRL SZ 6.5 (GLOVE) ×2 IMPLANT
GLOVE BIOGEL PI IND STRL 6.5 (GLOVE) ×1 IMPLANT
GLOVE BIOGEL PI IND STRL 8 (GLOVE) ×1 IMPLANT
GLOVE SURG ORTHO 8.0 STRL STRW (GLOVE) ×2 IMPLANT
GOWN STRL REUS W/ TWL XL LVL3 (GOWN DISPOSABLE) ×2 IMPLANT
HEAD CERAMIC FEMORAL 36MM (Head) IMPLANT
HOLDER FOLEY CATH W/STRAP (MISCELLANEOUS) ×1 IMPLANT
HOOD PEEL AWAY T7 (MISCELLANEOUS) ×3 IMPLANT
INSERT TRIDENT POLY 36 0DEG (Insert) IMPLANT
KIT BASIN OR (CUSTOM PROCEDURE TRAY) ×1 IMPLANT
KIT TURNOVER KIT A (KITS) IMPLANT
MANIFOLD NEPTUNE II (INSTRUMENTS) ×1 IMPLANT
MARKER SKIN DUAL TIP RULER LAB (MISCELLANEOUS) ×1 IMPLANT
NDL SAFETY ECLIPSE 18X1.5 (NEEDLE) ×2 IMPLANT
NS IRRIG 1000ML POUR BTL (IV SOLUTION) ×1 IMPLANT
PACK TOTAL JOINT (CUSTOM PROCEDURE TRAY) ×1 IMPLANT
PAD ARMBOARD 7.5X6 YLW CONV (MISCELLANEOUS) ×1 IMPLANT
PRESSURIZER FEMORAL UNIV (MISCELLANEOUS) IMPLANT
PROTECTOR NERVE ULNAR (MISCELLANEOUS) IMPLANT
RETRIEVER SUT HEWSON (MISCELLANEOUS) ×1 IMPLANT
SCREW HEX LP 6.5X25 (Screw) IMPLANT
SCREW HEX LP 6.5X35 (Screw) IMPLANT
SEALER BIPOLAR AQUA 6.0 (INSTRUMENTS) IMPLANT
SET HNDPC FAN SPRY TIP SCT (DISPOSABLE) IMPLANT
SHELL CLUSTERHOLE ACETABULAR 5 (Shell) IMPLANT
SHELL GRIPTION 100 48MM (Shell) IMPLANT
SHIELD FACE FULL FLUID (MISCELLANEOUS) ×1 IMPLANT
SOLUTION IRRIG SURGIPHOR (IV SOLUTION) IMPLANT
SPIKE FLUID TRANSFER (MISCELLANEOUS) ×1 IMPLANT
STEM ACCOLADE II SZ6 (Stem) IMPLANT
SUCTION TUBE FRAZIER 12FR DISP (SUCTIONS) ×1 IMPLANT
SUT BONE WAX W31G (SUTURE) ×1 IMPLANT
SUT ETHIBOND #5 BRAIDED 30INL (SUTURE) ×1 IMPLANT
SUT MNCRL AB 3-0 PS2 18 (SUTURE) ×1 IMPLANT
SUT STRATAFIX 0 PDS 27 VIOLET (SUTURE) ×1
SUT STRATAFIX 14 PDO 48 VLT (SUTURE) ×1 IMPLANT
SUT STRATAFIX PDO 1 14 VIOLET (SUTURE) ×1
SUT VIC AB 2-0 CT2 27 (SUTURE) ×2 IMPLANT
SUTURE STRATFX 0 PDS 27 VIOLET (SUTURE) ×1 IMPLANT
SYR 30ML LL (SYRINGE) ×1 IMPLANT
SYR 50ML LL SCALE MARK (SYRINGE) ×1 IMPLANT
TOWEL OR 17X26 10 PK STRL BLUE (TOWEL DISPOSABLE) ×1 IMPLANT
TOWER CARTRIDGE SMART MIX (DISPOSABLE) IMPLANT
TRAY FOLEY MTR SLVR 16FR STAT (SET/KITS/TRAYS/PACK) IMPLANT
TUBE SUCTION HIGH CAP CLEAR NV (SUCTIONS) ×1 IMPLANT
UNDERPAD 30X36 HEAVY ABSORB (UNDERPADS AND DIAPERS) ×1 IMPLANT
WATER STERILE IRR 1000ML POUR (IV SOLUTION) ×2 IMPLANT

## 2023-10-21 NOTE — Anesthesia Preprocedure Evaluation (Addendum)
 Anesthesia Evaluation  Patient identified by MRN, date of birth, ID band Patient awake    Reviewed: Allergy & Precautions, NPO status , Patient's Chart, lab work & pertinent test results  Airway Mallampati: III       Dental   Pulmonary sleep apnea and Continuous Positive Airway Pressure Ventilation    Pulmonary exam normal        Cardiovascular negative cardio ROS Normal cardiovascular exam     Neuro/Psych    GI/Hepatic Neg liver ROS, hiatal hernia, PUD,,,  Endo/Other  diabetesHypothyroidism  Patient on GLP-1 Agonist  Renal/GU      Musculoskeletal  (+) Arthritis ,    Abdominal  (+) + obese  Peds  Hematology negative hematology ROS (+)   Anesthesia Other Findings OA RIGHT HIP  Reproductive/Obstetrics                             Anesthesia Physical Anesthesia Plan  ASA: 3  Anesthesia Plan: Spinal   Post-op Pain Management:    Induction:   PONV Risk Score and Plan: 2 and Ondansetron , Dexamethasone , Propofol  infusion, Midazolam  and Treatment may vary due to age or medical condition  Airway Management Planned: Simple Face Mask  Additional Equipment:   Intra-op Plan:   Post-operative Plan:   Informed Consent: I have reviewed the patients History and Physical, chart, labs and discussed the procedure including the risks, benefits and alternatives for the proposed anesthesia with the patient or authorized representative who has indicated his/her understanding and acceptance.     Dental advisory given  Plan Discussed with:   Anesthesia Plan Comments:        Anesthesia Quick Evaluation

## 2023-10-21 NOTE — Op Note (Signed)
 10/21/2023  9:05 AM  PATIENT:  Angelica Gray   MRN: [id number]  PRE-OPERATIVE DIAGNOSIS: End-stage right total hip osteoarthritis  POST-OPERATIVE DIAGNOSIS:  same  PROCEDURE: RIGHT TOTAL HIP ARTHROPLASTY  PREOPERATIVE INDICATIONS:    Angelica Gray is an 63 y.o. female who has a diagnosis of  End-stage right total hip osteoarthritis and elected for surgical management after failing conservative treatment.  The risks benefits and alternatives were discussed with the patient including but not limited to the risks of nonoperative treatment, versus surgical intervention including infection, bleeding, nerve injury, periprosthetic fracture, the need for revision surgery, dislocation, leg length discrepancy, blood clots, cardiopulmonary complications, morbidity, mortality, among others, and they were willing to proceed.     OPERATIVE REPORT     SURGEON:  Toribio Higashi, MD    ASSISTANT: Bernarda Mclean, PA-C, (Present throughout the entire procedure,  necessary for completion of procedure in a timely manner, assisting with retraction, instrumentation, and closure)     ANESTHESIA: Spinal  ESTIMATED BLOOD LOSS: 300cc    COMPLICATIONS:  None.     COMPONENTS:  Trident 2 Stryker 52 mm acetabular shell, 6.5 pack screws x 2, neutral Trident 2 X.3 polyethylene insert alpha code E, Accolade two 132 degree neck angle size #6 stem, 36+0 ceramic head Implant Name Type Inv. Item Serial No. Manufacturer Lot No. LRB No. Used Action  SHELL CLUSTERHOLE ACETABULAR 5 - [id number] Shell SHELL CLUSTERHOLE ACETABULAR 5  [employer] [id number] Right 1 Implanted  INSERT TRIDENT POLY 36 0DEG - [id number] Insert INSERT TRIDENT POLY 36 0DEG  [employer] [id number] Right 1 Implanted  SCREW HEX LP 6.5X25 - [id number] Screw SCREW HEX LP 6.5X25  [employer] [id number] Right 1 Implanted  SCREW HEX LP 6.5X35 - [id number] Screw SCREW HEX LP 6.5X35  [employer] [id number] Right 1 Implanted  STEM ACCOLADE  II SZ6 - [id number] Stem STEM ACCOLADE II SZ6  [employer] [id number] A Right 1 Implanted  HEAD CERAMIC FEMORAL - [id number] Head HEAD CERAMIC FEMORAL  [employer] [id number] Right 1 Implanted      PROCEDURE IN DETAIL:   The patient was met in the holding area and  identified.  The appropriate hip was identified and marked at the operative site.  The patient was then transported to the OR  and  placed under anesthesia.  At that point, the patient was  placed in the lateral decubitus position with the operative side up and  secured to the operating room table  and all bony prominences padded. A subaxillary role was also placed.    The operative lower extremity was prepped from the iliac crest to the distal leg.  Sterile draping was performed.  Preoperative antibiotics, 2 gm of ancef , 1 g of Vancomycin ,1 gm of Tranexamic Acid , and 8 mg of Decadron  administered. Time out was performed prior to incision.      A routine posterolateral approach was utilized via sharp dissection  carried down to the subcutaneous tissue.  Gross bleeders were Bovie coagulated.  The iliotibial band was identified and incised along the length of the skin incision through the glute max fascia.  Charnley retractor was placed with care to protect the sciatic nerve posteriorly.  With the hip internally rotated, the piriformis tendon was identified and released from the femoral insertion and tagged with a #5 Ethibond.  A capsulotomy was then performed off the femoral insertion and also tagged with a #5 Ethibond.    The femoral neck was exposed, and I resected the  femoral neck based on preoperative templating relative to the lesser trochanter.    I then exposed the deep acetabulum, cleared out any tissue including the ligamentum teres.  After adequate visualization, I excised the labrum.  I then started reaming with a 48 mm reamer, first medializing to the floor of the cotyloid fossa, and then in the position of  the cup aiming towards the greater sciatic notch, matching the version of the transverse acetabular ligament and tucked under the anterior wall. I reamed up to 52 mm reamer with good bony bed preparation and a 52 mm cup was chosen.  The real cup was then impacted into place.  Appropriate version and inclination was confirmed clinically matching their bony anatomy, and also with the use of the jig.  I placed 2 screws in the posterior superior quadrant to augment fixation.  A neutral liner was placed and impacted. It was confirmed to be appropriately seated and the acetabular retractors were removed.    I then prepared the proximal femur using the box cutter, Charnley awl, and then sequentially broached starting with 0 up to a size 6.  A trial broach, neck, and head was utilized, and I reduced the hip and it was found to have excellent stability.  There was no impingement with full extension and 90 degrees external rotation.  The hip was stable at the position of sleep and with 90 degrees flexion and 80 degrees of internal rotation.  Leg lengths were also clinically assessed in the lateral position and felt to be equal. Intra-Op flatplate was obtained and confirmed appropriate component positions.  Good fill of the femur with the size 6 broach.  And restoration of leg length and offset. No evidence or concern for fracture.  A final femoral prosthesis size 6 was selected. I then impacted the real femoral prosthesis into place.I again trialed and selected a 36+ 0mm ball. The hip was then reduced and taken through a range of motion. There was no impingement with full extension and 90 degrees external rotation.  The hip was stable at the position of sleep and with 90 degrees flexion and 80 degrees of internal rotation. Leg lengths were  again assessed and felt to be restored.  We then opened, and I impacted the real head ball into place.  The posterior capsule was then closed with #5 Ethibond.  The piriformis  was repaired through the base of the abductor tendon using a [place] suture passer.  I then irrigated the hip copiously with dilute Betadine  and with normal saline pulse lavage. Periarticular injection was then performed with Exparel .   We repaired the fascia #1 barbed suture, followed by 0 barbed suture for the subcutaneous fat.  Skin was closed with 2-0 Vicryl and 3-0 Monocryl.  Dermabond and Aquacel dressing were applied. The patient was then awakened and returned to PACU in stable and satisfactory condition.  Leg lengths in the supine position were assessed and felt to be clinically equal. There were no complications.  Post op recs: WB: WBAT LLE, No formal hip precautions Abx: ancef  and vanc given postive MRSA nasal swab Imaging: PACU pelvis Xray Dressing: Aquacell, keep intact until follow up DVT prophylaxis: Aspirin  81BID starting POD1 Follow up: 2 weeks after surgery for a wound check with Dr. Edna at [facility].  Address: [address], [place], [place] [address]  Office Phone: [phone]   Toribio Edna, MD Orthopedic Surgeon

## 2023-10-21 NOTE — Anesthesia Procedure Notes (Addendum)
 Spinal  Patient location during procedure: OR Start time: 10/21/2023 7:32 AM End time: 10/21/2023 7:37 AM Reason for block: surgical anesthesia Staffing Performed: anesthesiologist  Anesthesiologist: Patrisha Bernardino SQUIBB, MD Performed by: Patrisha Bernardino SQUIBB, MD Authorized by: Patrisha Bernardino SQUIBB, MD   Preanesthetic Checklist Completed: patient identified, IV checked, risks and benefits discussed, surgical consent, monitors and equipment checked, pre-op evaluation and timeout performed Spinal Block Patient position: sitting Prep: DuraPrep Patient monitoring: cardiac monitor, continuous pulse ox and blood pressure Approach: midline Location: L3-4 Injection technique: single-shot Needle Needle type: Pencan  Needle gauge: 24 G Needle length: 9 cm Assessment Sensory level: T10 Events: CSF return Additional Notes Functioning IV was confirmed and monitors were applied. Sterile prep and drape, including hand hygiene and sterile gloves were used. The patient was positioned and the spine was prepped. The skin was anesthetized with lidocaine.  Free flow of clear CSF was obtained prior to injecting local anesthetic into the CSF.  The spinal needle aspirated freely following injection.  The needle was carefully withdrawn.  The patient tolerated the procedure well.

## 2023-10-21 NOTE — Interval H&P Note (Signed)
The patient has been re-examined, and the chart reviewed, and there have been no interval changes to the documented history and physical.    Plan for R THA for R hip OA  The operative side was examined and the patient was confirmed to have sensation to DPN, SPN, TN intact, Motor EHL, ext, flex 5/5, and DP 2+, PT 2+, No significant edema.   The risks, benefits, and alternatives have been discussed at length with patient, and the patient is willing to proceed.  Right hip marked. Consent has been signed.

## 2023-10-21 NOTE — Evaluation (Signed)
 Physical Therapy Evaluation Patient Details Name: Angelica Gray MRN: [id number] DOB: 09-19-1961 Today's Date: 10/21/2023  History of Present Illness  63 yo female presents to therapy s/p R THA, posterior lateral approach on 10/21/2023 due to failure of conservative measures. Pt PMH includes but is not limited to: goiter, OSA, hernia repair, cameron lesion, anemia, hypoglycemia, RLS, hypothyroidism, HLD, and myofascial pain syndrome.  Clinical Impression   Angelica Gray is a 63 y.o. female POD 0 s/p R THA. Patient reports IND with mobility at baseline. Patient is now limited by functional impairments (see PT problem list below) and requires CGA for transfers and gait with RW. Patient was able to ambulate 45  feet x 2  with RW and CGA and cues for safe walker management. Patient educated on safe sequencing for stair mobility with one UE support at handrail to emulate wall and HHA, fall risk prevention, use of RW, pain management and goal, use of CP/ice, car transfers and slowly increasing activity pt and spouse verbalized safe guarding position for people assisting with mobility. Patient instructed in exercises to facilitate ROM and circulation reviewed and HO provided. Patient will benefit from continued skilled PT interventions to address impairments and progress towards PLOF. Patient has met mobility goals at adequate level for discharge home with family support and OPPT services scheduled for 1/10. will continue to follow if pt continues acute stay to progress towards Mod I goals.       If plan is discharge home, recommend the following: A little help with walking and/or transfers;A little help with bathing/dressing/bathroom;Assistance with cooking/housework;Help with stairs or ramp for entrance;Assist for transportation   Can travel by private vehicle        Equipment Recommendations Rolling walker (2 wheels)  Recommendations for Other Services       Functional Status Assessment Patient has  had a recent decline in their functional status and demonstrates the ability to make significant improvements in function in a reasonable and predictable amount of time.     Precautions / Restrictions Precautions Precautions: Fall Restrictions Weight Bearing Restrictions Per Provider Order: No      Mobility  Bed Mobility Overal bed mobility: Needs Assistance Bed Mobility: Supine to Sit     Supine to sit: Supervision, HOB elevated     General bed mobility comments: min cues    Transfers Overall transfer level: Needs assistance Equipment used: Rolling walker (2 wheels) Transfers: Sit to/from Stand Sit to Stand: Contact guard assist           General transfer comment: min cues    Ambulation/Gait Ambulation/Gait assistance: Contact guard assist Gait Distance (Feet): 45 Feet Assistive device: Rolling walker (2 wheels) Gait Pattern/deviations: Step-to pattern, Antalgic, Trunk flexed Gait velocity: decreased     General Gait Details: B UE support at RW to offload R LE in stance phase, cues for safety and posture  Stairs Stairs: Yes Stairs assistance: Contact guard assist Stair Management: Two rails Number of Stairs: 2 General stair comments: step navigation initiated with B handrail, CGA and cues, pt able to progress to L UE support on handrail to emulate wall for support and HHA with CGA and min cues  Wheelchair Mobility     Tilt Bed    Modified Rankin (Stroke Patients Only)       Balance Overall balance assessment: Needs assistance Sitting-balance support: Feet supported Sitting balance-Leahy Scale: Good     Standing balance support: Bilateral upper extremity supported, During functional activity, Reliant on assistive device  for balance Standing balance-Leahy Scale: Fair Standing balance comment: static standing no UE support                             Pertinent Vitals/Pain Pain Assessment Pain Assessment: 0-10 Pain Score: 8  Pain  Location: R hip and L hip Pain Descriptors / Indicators: Aching, Discomfort, Constant, Grimacing, Operative site guarding Pain Intervention(s): Limited activity within patient's tolerance, Monitored during session, Premedicated before session, Repositioned, Ice applied    Home Living Family/patient expects to be discharged to:: Private residence Living Arrangements: Spouse/significant other;Children Available Help at Discharge: Family Type of Home: House Home Access: Stairs to enter Entrance Stairs-Rails: None Entrance Stairs-Number of Steps: 3   Home Layout: One level Home Equipment: Toilet riser      Prior Function Prior Level of Function : Independent/Modified Independent;Working/employed;Driving             Mobility Comments: IND no AD for all ADLs, self care tasks and IADLs       Extremity/Trunk Assessment        Lower Extremity Assessment Lower Extremity Assessment: RLE deficits/detail RLE Deficits / Details: ankle DF/PF 5/5 RLE Sensation: WNL    Cervical / Trunk Assessment Cervical / Trunk Assessment: Normal  Communication   Communication Communication: No apparent difficulties  Cognition Arousal: Alert Behavior During Therapy: WFL for tasks assessed/performed Overall Cognitive Status: Within Functional Limits for tasks assessed                                          General Comments      Exercises Total Joint Exercises Ankle Circles/Pumps: AROM, Both, 10 reps Quad Sets: AROM, Right, 5 reps Heel Slides: AROM, Right, 5 reps Hip ABduction/ADduction: AROM, Right, 5 reps, Standing Long Arc Quad: AROM, Right, 5 reps, Seated Knee Flexion: AROM, Right, 5 reps, Standing Standing Hip Extension: AROM, Right, 5 reps   Assessment/Plan    PT Assessment Patient needs continued PT services  PT Problem List Decreased strength;Decreased range of motion;Decreased activity tolerance;Decreased balance;Decreased mobility;Decreased  coordination;Pain       PT Treatment Interventions DME instruction;Gait training;Stair training;Functional mobility training;Therapeutic activities;Therapeutic exercise;Neuromuscular re-education;Balance training;Patient/family education;Modalities    PT Goals (Current goals can be found in the Care Plan section)  Acute Rehab PT Goals Patient Stated Goal: to get the other, L hip done and then be able to run a 5 K in September 2025 PT Goal Formulation: With patient Time For Goal Achievement: 11/04/23 Potential to Achieve Goals: Good    Frequency 7X/week     Co-evaluation               AM-PAC PT 6 Clicks Mobility  Outcome Measure Help needed turning from your back to your side while in a flat bed without using bedrails?: None Help needed moving from lying on your back to sitting on the side of a flat bed without using bedrails?: A Little Help needed moving to and from a bed to a chair (including a wheelchair)?: A Little Help needed standing up from a chair using your arms (e.g., wheelchair or bedside chair)?: A Little Help needed to walk in hospital room?: A Little Help needed climbing 3-5 steps with a railing? : A Little 6 Click Score: 19    End of Session Equipment Utilized During Treatment: Gait belt Activity Tolerance: Patient tolerated treatment  well;No increased pain Patient left: in chair;with call bell/phone within reach;with family/visitor present Nurse Communication: Mobility status;Other (comment) (pt readiness for d/c from PT standpoint) PT Visit Diagnosis: Unsteadiness on feet (R26.81);Other abnormalities of gait and mobility (R26.89);Muscle weakness (generalized) (M62.81);Difficulty in walking, not elsewhere classified (R26.2);Other (comment)    Time: 1320-1403 PT Time Calculation (min) (ACUTE ONLY): 43 min   Charges:   PT Evaluation $PT Eval Low Complexity: 1 Low PT Treatments $Gait Training: 8-22 mins $Therapeutic Exercise: 8-22 mins PT General  Charges $$ ACUTE PT VISIT: 1 Visit         [place], PT Acute Rehab   [place] VEAR Drone 10/21/2023, 5:09 PM

## 2023-10-21 NOTE — Discharge Instructions (Signed)
 INSTRUCTIONS AFTER JOINT REPLACEMENT   Remove items at home which could result in a fall. This includes throw rugs or furniture in walking pathways ICE to the affected joint every three hours while awake for 30 minutes at a time, for at least the first 3-5 days, and then as needed for pain and swelling.  Continue to use ice for pain and swelling. You may notice swelling that will progress down to the foot and ankle.  This is normal after surgery.  Elevate your leg when you are not up walking on it.   Continue to use the breathing machine you got in the hospital (incentive spirometer) which will help keep your temperature down.  It is common for your temperature to cycle up and down following surgery, especially at night when you are not up moving around and exerting yourself.  The breathing machine keeps your lungs expanded and your temperature down.  DIET:  As you were doing prior to hospitalization, we recommend a well-balanced diet.  DRESSING / WOUND CARE / SHOWERING:  Keep the surgical dressing until follow up.  The dressing is water proof, so you can shower without any extra covering.  IF THE DRESSING FALLS OFF or the wound gets wet inside, change the dressing with sterile gauze.  Please use good hand washing techniques before changing the dressing.  Do not use any lotions or creams on the incision until instructed by your surgeon.    ACTIVITY  Increase activity slowly as tolerated, but follow the weight bearing instructions below.   No driving for 6 weeks or until further direction given by your physician.  You cannot drive while taking narcotics.  No lifting or carrying greater than 10 lbs. until further directed by your surgeon. Avoid periods of inactivity such as sitting longer than an hour when not asleep. This helps prevent blood clots.  You may return to work once you are authorized by your doctor.   WEIGHT BEARING: Weight bearing as tolerated with assist device (walker, cane, etc) as  directed, use it as long as suggested by your surgeon or therapist, typically at least 4-6 weeks.  EXERCISES  Results after joint replacement surgery are often greatly improved when you follow the exercise, range of motion and muscle strengthening exercises prescribed by your doctor. Safety measures are also important to protect the joint from further injury. Any time any of these exercises cause you to have increased pain or swelling, decrease what you are doing until you are comfortable again and then slowly increase them. If you have problems or questions, call your caregiver or physical therapist for advice.   Rehabilitation is important following a joint replacement. After just a few days of immobilization, the muscles of the leg can become weakened and shrink (atrophy).  These exercises are designed to build up the tone and strength of the thigh and leg muscles and to improve motion. Often times heat used for twenty to thirty minutes before working out will loosen up your tissues and help with improving the range of motion but do not use heat for the first two weeks following surgery (sometimes heat can increase post-operative swelling).   These exercises can be done on a training (exercise) mat, on the floor, on a table or on a bed. Use whatever works the best and is most comfortable for you.    Use music or television while you are exercising so that the exercises are a pleasant break in your day. This will make your life  better with the exercises acting as a break in your routine that you can look forward to.   Perform all exercises about fifteen times, three times per day or as directed.  You should exercise both the operative leg and the other leg as well.  Exercises include:   Quad Sets - Tighten up the muscle on the front of the thigh (Quad) and hold for 5-10 seconds.   Straight Leg Raises - With your knee straight (if you were given a brace, keep it on), lift the leg to 60 degrees, hold  for 3 seconds, and slowly lower the leg.  Perform this exercise against resistance later as your leg gets stronger.  Leg Slides: Lying on your back, slowly slide your foot toward your buttocks, bending your knee up off the floor (only go as far as is comfortable). Then slowly slide your foot back down until your leg is flat on the floor again.  Angel Wings: Lying on your back spread your legs to the side as far apart as you can without causing discomfort.  Hamstring Strength:  Lying on your back, push your heel against the floor with your leg straight by tightening up the muscles of your buttocks.  Repeat, but this time bend your knee to a comfortable angle, and push your heel against the floor.  You may put a pillow under the heel to make it more comfortable if necessary.   A rehabilitation program following joint replacement surgery can speed recovery and prevent re-injury in the future due to weakened muscles. Contact your doctor or a physical therapist for more information on knee rehabilitation.   CONSTIPATION:  Constipation is defined medically as fewer than three stools per week and severe constipation as less than one stool per week.  Even if you have a regular bowel pattern at home, your normal regimen is likely to be disrupted due to multiple reasons following surgery.  Combination of anesthesia, postoperative narcotics, change in appetite and fluid intake all can affect your bowels.   YOU MUST use at least one of the following options; they are listed in order of increasing strength to get the job done.  They are all available over the counter, and you may need to use some, POSSIBLY even all of these options:    Drink plenty of fluids (prune juice may be helpful) and high fiber foods Colace 100 mg by mouth twice a day  Senokot for constipation as directed and as needed Dulcolax (bisacodyl), take with full glass of water  Miralax (polyethylene glycol) once or twice a day as needed.  If you  have tried all these things and are unable to have a bowel movement in the first 3-4 days after surgery call either your surgeon or your primary doctor.    If you experience loose stools or diarrhea, hold the medications until you stool forms back up.  If your symptoms do not get better within 1 week or if they get worse, check with your doctor.  If you experience "the worst abdominal pain ever" or develop nausea or vomiting, please contact the office immediately for further recommendations for treatment.  ITCHING:  If you experience itching with your medications, try taking only a single pain pill, or even half a pain pill at a time.  You can also use Benadryl over the counter for itching or also to help with sleep.   TED HOSE STOCKINGS:  Use stockings on both legs until for at least 2 weeks or  as directed by physician office. They may be removed at night for sleeping.  MEDICATIONS:  See your medication summary on the "After Visit Summary" that nursing will review with you.  You may have some home medications which will be placed on hold until you complete the course of blood thinner medication.  It is important for you to complete the blood thinner medication as prescribed.  Blood clot prevention (DVT Prophylaxis): After surgery you are at an increased risk for a blood clot. you were prescribed a blood thinner, Aspirin 81mg , to be taken twice daily for a total of 4 weeks from surgery to help reduce your risk of getting a blood clot.  Signs of a pulmonary embolus (blood clot in the lungs) include sudden short of breath, feeling lightheaded or dizzy, chest pain with a deep breath, rapid pulse rapid breathing.  Signs of a blood clot in your arms or legs include new unexplained swelling and cramping, warm, red or darkened skin around the painful area.  Please call the office or 911 right away if these signs or symptoms develop.  PRECAUTIONS:   If you experience chest pain or shortness of breath - call 911  immediately for transfer to the hospital emergency department.   If you develop a fever greater that 101 F, purulent drainage from wound, increased redness or drainage from wound, foul odor from the wound/dressing, or calf pain - CONTACT YOUR SURGEON.                                                   FOLLOW-UP APPOINTMENTS:  If you do not already have a post-op appointment, please call the office for an appointment to be seen by your surgeon.  Guidelines for how soon to be seen are listed in your "After Visit Summary", but are typically between 2-3 weeks after surgery.  If you have a specialized bandage, you may be told to follow up 1 week after surgery.  POST-OPERATIVE OPIOID TAPER INSTRUCTIONS: It is important to wean off of your opioid medication as soon as possible. If you do not need pain medication after your surgery it is ok to stop day one. Opioids include: Codeine, Hydrocodone(Norco, Vicodin), Oxycodone(Percocet, oxycontin) and hydromorphone amongst others.  Long term and even short term use of opiods can cause: Increased pain response Dependence Constipation Depression Respiratory depression And more.  Withdrawal symptoms can include Flu like symptoms Nausea, vomiting And more Techniques to manage these symptoms Hydrate well Eat regular healthy meals Stay active Use relaxation techniques(deep breathing, meditating, yoga) Do Not substitute Alcohol to help with tapering If you have been on opioids for less than two weeks and do not have pain than it is ok to stop all together.  Plan to wean off of opioids This plan should start within one week post op of your joint replacement. Maintain the same interval or time between taking each dose and first decrease the dose.  Cut the total daily intake of opioids by one tablet each day Next start to increase the time between doses. The last dose that should be eliminated is the evening dose.   MAKE SURE YOU:  Understand these  instructions.  Get help right away if you are not doing well or get worse.    Thank you for letting us be a part of your medical care team.  It is a privilege we respect greatly.  We hope these instructions will help you stay on track for a fast and full recovery!

## 2023-10-21 NOTE — Transfer of Care (Signed)
 Immediate Anesthesia Transfer of Care Note  Patient: Angelica Gray  Procedure(s) Performed: TOTAL HIP ARTHROPLASTY (Right: Hip)  Patient Location: PACU  Anesthesia Type:MAC and Spinal  Level of Consciousness: drowsy  Airway & Oxygen  Therapy: Patient Spontanous Breathing and Patient connected to face mask oxygen   Post-op Assessment: Report given to RN, Post -op Vital signs reviewed and unstable, Anesthesiologist notified, and Patient moving all extremities X 4  Post vital signs: Reviewed and stable  Last Vitals:  Vitals Value Taken Time  BP 110/62 10/21/23 0939  Temp    Pulse 87 10/21/23 0940  Resp 12 10/21/23 0940  SpO2 100 % 10/21/23 0940  Vitals shown include unfiled device data.  Last Pain:  Vitals:   10/21/23 9367  TempSrc: Oral  PainSc:          Complications: No notable events documented.

## 2023-10-22 ENCOUNTER — Encounter (HOSPITAL_COMMUNITY): Payer: Self-pay | Admitting: Orthopedic Surgery

## 2023-10-22 NOTE — Anesthesia Postprocedure Evaluation (Signed)
 Anesthesia Post Note  Patient: Angelica Gray  Procedure(s) Performed: TOTAL HIP ARTHROPLASTY (Right: Hip)     Patient location during evaluation: PACU Anesthesia Type: Spinal Level of consciousness: awake Pain management: pain level controlled Vital Signs Assessment: post-procedure vital signs reviewed and stable Respiratory status: spontaneous breathing, nonlabored ventilation and respiratory function stable Cardiovascular status: blood pressure returned to baseline and stable Postop Assessment: no apparent nausea or vomiting Anesthetic complications: no   No notable events documented.  Last Vitals:  Vitals:   10/21/23 1200 10/21/23 1300  BP: 115/79 114/82  Pulse: 90 84  Resp:  14  Temp:    SpO2: 100% 98%    Last Pain:  Vitals:   10/21/23 1300  TempSrc:   PainSc: 4                  Delray Reza P Laraya Pestka

## 2023-11-04 ENCOUNTER — Other Ambulatory Visit: Payer: Self-pay | Admitting: Internal Medicine

## 2023-12-08 ENCOUNTER — Other Ambulatory Visit: Payer: Self-pay | Admitting: Internal Medicine

## 2023-12-24 NOTE — Progress Notes (Deleted)
 No chief complaint on file.   HPI: Angelica Gray 63 y.o. come in for Chronic disease management   Dm SP tha jan 25  Lastr visit with me was 10 24  ROS: See pertinent positives and negatives per HPI.  Past Medical History:  Diagnosis Date   Arthritis    Atrophic kidney left   Diabetes mellitus without complication (HCC)    History of hiatal hernia    History of renal stone 2004   Hyperlipidemia    Hypothyroidism    Insomnia    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 02/01/2015   Pneumonia    COVID   RESTLESS LEG SYNDROME, MILD 11/07/2007   Qualifier: Diagnosis of  By: Lovell Sheehan MD, Balinda Quails    Sleep apnea    no CPAP     Family History  Problem Relation Age of Onset   Kidney failure Mother        from dm and ht  had 11 children    Hyperlipidemia Mother    Diabetes Mother    Thyroid disease Mother    Diabetes Father    Heart attack Father        dec age 34   Throat cancer Paternal Grandmother        dipped stuff   Esophageal cancer Paternal Grandmother    Thyroid disease Sister    Colon cancer Neg Hx    Stomach cancer Neg Hx     Social History   Socioeconomic History   Marital status: Married    Spouse name: Not on file   Number of children: 4   Years of education: Not on file   Highest education level: Not on file  Occupational History   Occupation: special events planner    Comment: Visual merchandiser  Tobacco Use   Smoking status: Never   Smokeless tobacco: Never  Vaping Use   Vaping status: Never Used  Substance and Sexual Activity   Alcohol use: No    Alcohol/week: 0.0 standard drinks of alcohol   Drug use: No   Sexual activity: Not Currently  Other Topics Concern   Not on file  Social History Narrative   Works Forensic scientist   HH of  5     no tob  And    G4P4neg tad    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   Social Drivers of Health   Financial Resource Strain: Not on file  Food  Insecurity: Not on file  Transportation Needs: Not on file  Physical Activity: Insufficiently Active (02/14/2023)   Exercise Vital Sign    Days of Exercise per Week: 1 day    Minutes of Exercise per Session: 30 min  Stress: No Stress Concern Present (02/14/2023)   [employer] of Occupational Health - Occupational Stress Questionnaire    Feeling of Stress : Only a little  Social Connections: Socially Integrated (02/14/2023)   Social Connection and Isolation Panel [NHANES]    Frequency of Communication with Friends and Family: More than three times a week    Frequency of Social Gatherings with Friends and Family: More than three times a week    Attends Religious Services: More than 4 times per year    Active Member of [employer] or Organizations: Yes    Attends Banker Meetings: More than 4 times per year    Marital Status: Married    Outpatient Medications Prior to Visit  Medication Sig Dispense Refill   albuterol (VENTOLIN HFA) 108 (90 Base) MCG/ACT inhaler Inhale 2 puffs into the lungs every 6 (six) hours as needed for wheezing or shortness of breath. (Patient not taking: Reported on 10/02/2023) 8 g 1   Ascorbic Acid (VITAMIN C) 1000 MG tablet Take 1,000 mg by mouth daily.     chlorhexidine (HIBICLENS) 4 % external liquid Apply 15 mLs (1 Application total) topically as directed for 30 doses. Use as directed daily for 5 days every other week for 6 weeks. 946 mL 1   Cholecalciferol 50 MCG (2000 UT) CAPS Take 2,000 Units by mouth daily.     Cyanocobalamin (VITAMIN B 12 PO) Take 1 tablet by mouth daily.     fluticasone (FLONASE) 50 MCG/ACT nasal spray Place 2 sprays into both nostrils daily. (Patient taking differently: Place 2 sprays into both nostrils daily as needed for allergies.) 16 g 6   levothyroxine (SYNTHROID) 112 MCG tablet TAKE 1 TABLET BY MOUTH ONCE DAILY BEFORE  BREAKFAST 90 tablet 0   magnesium 30 MG tablet Take 30 mg by mouth daily. Pt unsure of dose     NON FORMULARY  Pt uses a cpap nightly     omeprazole (PRILOSEC) 20 MG capsule Take 1 capsule (20 mg total) by mouth daily. (Patient not taking: Reported on 08/13/2023) 30 capsule 3   OZEMPIC, 1 MG/DOSE, 4 MG/3ML SOPN INJECT 1MG  SUBCUTANEOUSLY ONCE WEEKLY AS DIRECTED; DOSAGE CHANGE 3 mL 0   polyethylene glycol (MIRALAX) 17 g packet Take 17 g by mouth daily. 14 each 0   No facility-administered medications prior to visit.     EXAM:  There were no vitals taken for this visit.  There is no height or weight on file to calculate BMI.  GENERAL: vitals reviewed and listed above, alert, oriented, appears well hydrated and in no acute distress HEENT: atraumatic, conjunctiva  clear, no obvious abnormalities on inspection of external nose and ears OP : no lesion edema or exudate  NECK: no obvious masses on inspection palpation  LUNGS: clear to auscultation bilaterally, no wheezes, rales or rhonchi, good air movement CV: HRRR, no clubbing cyanosis or  peripheral edema nl cap refill  MS: moves all extremities without noticeable focal  abnormality PSYCH: pleasant and cooperative, no obvious depression or anxiety Lab Results  Component Value Date   WBC 11.0 (H) 10/21/2023   HGB 13.0 10/21/2023   HCT 39.0 10/21/2023   PLT 182 10/21/2023   GLUCOSE 168 (H) 10/21/2023   CHOL 160 02/14/2023   TRIG 82.0 02/14/2023   HDL 46.80 02/14/2023   LDLDIRECT 122.0 02/15/2010   LDLCALC 97 02/14/2023   ALT 20 10/10/2023   AST 21 10/10/2023   NA 140 10/21/2023   K 4.1 10/21/2023   CL 108 10/21/2023   CREATININE 0.98 10/21/2023   BUN 14 10/21/2023   CO2 24 10/21/2023   TSH 1.99 02/14/2023   INR 1.02 04/18/2010   HGBA1C 6.0 (H) 10/10/2023   MICROALBUR 0.9 04/15/2023   BP Readings from Last 3 Encounters:  10/21/23 114/82  10/10/23 120/86  08/13/23 110/80    ASSESSMENT AND PLAN:  Discussed the following assessment and plan:  No diagnosis found.  -Patient advised to return or notify health care team  if  new  concerns arise.  There are no Patient Instructions on file for this visit.   Neta Mends. Bellamie Turney M.D.

## 2023-12-25 ENCOUNTER — Ambulatory Visit: Payer: 59 | Admitting: Internal Medicine

## 2024-01-10 ENCOUNTER — Other Ambulatory Visit: Payer: Self-pay | Admitting: Internal Medicine

## 2024-01-22 ENCOUNTER — Encounter: Payer: Self-pay | Admitting: Internal Medicine

## 2024-01-22 ENCOUNTER — Telehealth (INDEPENDENT_AMBULATORY_CARE_PROVIDER_SITE_OTHER): Admitting: Internal Medicine

## 2024-01-22 VITALS — Wt 205.0 lb

## 2024-01-22 DIAGNOSIS — E66812 Obesity, class 2: Secondary | ICD-10-CM

## 2024-01-22 DIAGNOSIS — Z6837 Body mass index (BMI) 37.0-37.9, adult: Secondary | ICD-10-CM

## 2024-01-22 DIAGNOSIS — E118 Type 2 diabetes mellitus with unspecified complications: Secondary | ICD-10-CM

## 2024-01-22 DIAGNOSIS — E039 Hypothyroidism, unspecified: Secondary | ICD-10-CM | POA: Diagnosis not present

## 2024-01-22 DIAGNOSIS — Z79899 Other long term (current) drug therapy: Secondary | ICD-10-CM | POA: Diagnosis not present

## 2024-01-22 DIAGNOSIS — G4733 Obstructive sleep apnea (adult) (pediatric): Secondary | ICD-10-CM | POA: Diagnosis not present

## 2024-01-22 MED ORDER — SCOPOLAMINE 1 MG/3DAYS TD PT72: 1.0000 | MEDICATED_PATCH | TRANSDERMAL | 1 refills | Status: AC

## 2024-01-22 MED ORDER — OZEMPIC (2 MG/DOSE) 8 MG/3ML ~~LOC~~ SOPN: 2.0000 mg | PEN_INJECTOR | SUBCUTANEOUS | 2 refills | Status: DC

## 2024-01-22 NOTE — Progress Notes (Signed)
 Virtual Visit via Video Note  I connected with Angelica Gray on 01/22/24 at  3:30 PM EDT by a video enabled telemedicine application and verified that I am speaking with the correct person using two identifiers. Location patient: vehicle  Location provider:work office Persons participating in the virtual visit: patient, provider   Patient aware  of the limitations of evaluation and management by telemedicine and  availability of in person appointments. and agreed to proceed.   HPI: Angelica Gray presents for video visit    ozempic  on 1 mg for a while an no sig se  some weight loss but stable  no low bg   last appt  didn't happen.  Due for check  No new conditions   Needs updated pulm osa equip doing ok  saw pulmonary in past and had to go to [place]  too  far . Doing ok . Lab due    To go on 1 week cruise to [place]  April 19  asks for seasick patch in case .  Weight is 205   has lost weight .   not sure of bp readings      ROS: See pertinent positives and negatives per HPI.  Past Medical History:  Diagnosis Date   Arthritis    Atrophic kidney left   Diabetes mellitus without complication (HCC)    History of hiatal hernia    History of renal stone 2004   Hyperlipidemia    Hypothyroidism    Insomnia    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 02/01/2015   Pneumonia    COVID   RESTLESS LEG SYNDROME, MILD 11/07/2007   Qualifier: Diagnosis of  By: Lovell Sheehan MD, Balinda Quails    Sleep apnea    no CPAP     Past Surgical History:  Procedure Laterality Date   COLONOSCOPY     ESOPHAGOGASTRODUODENOSCOPY     HIATAL HERNIA REPAIR N/A 09/05/2015   Procedure: LAPAROSCOPIC REPAIR OF HIATAL HERNIA;  Surgeon: Ovidio Kin, MD;  Location: [facility];  Service: General;  Laterality: N/A;   KIDNEY STONE SURGERY     LASIK Bilateral    MANDIBLE FRACTURE SURGERY     REFRACTIVE SURGERY     TOTAL HIP ARTHROPLASTY Right 10/21/2023   Procedure: TOTAL HIP ARTHROPLASTY;   Surgeon: Joen Laura, MD;  Location: [facility];  Service: Orthopedics;  Laterality: Right;   TUBAL LIGATION  10/16/1999    Family History  Problem Relation Age of Onset   Kidney failure Mother        from dm and ht  had 11 children    Hyperlipidemia Mother    Diabetes Mother    Thyroid disease Mother    Diabetes Father    Heart attack Father        dec age 29   Throat cancer Paternal Grandmother        dipped stuff   Esophageal cancer Paternal Grandmother    Thyroid disease Sister    Colon cancer Neg Hx    Stomach cancer Neg Hx     Social History   Tobacco Use   Smoking status: Never   Smokeless tobacco: Never  Vaping Use   Vaping status: Never Used  Substance Use Topics   Alcohol use: No    Alcohol/week: 0.0 standard drinks of alcohol   Drug use: No      Current Outpatient Medications:    Ascorbic Acid (VITAMIN C) 1000 MG tablet,  Take 1,000 mg by mouth daily., Disp: , Rfl:    Cholecalciferol 50 MCG (2000 UT) CAPS, Take 2,000 Units by mouth daily., Disp: , Rfl:    Cyanocobalamin (VITAMIN B 12 PO), Take 1 tablet by mouth daily., Disp: , Rfl:    fluticasone (FLONASE) 50 MCG/ACT nasal spray, Place 2 sprays into both nostrils daily. (Patient taking differently: Place 2 sprays into both nostrils daily as needed for allergies.), Disp: 16 g, Rfl: 6   levothyroxine (SYNTHROID) 112 MCG tablet, TAKE 1 TABLET BY MOUTH ONCE DAILY BEFORE  BREAKFAST, Disp: 90 tablet, Rfl: 0   magnesium 30 MG tablet, Take 30 mg by mouth daily. Pt unsure of dose, Disp: , Rfl:    NON FORMULARY, Pt uses a cpap nightly, Disp: , Rfl:    polyethylene glycol (MIRALAX) 17 g packet, Take 17 g by mouth daily., Disp: 14 each, Rfl: 0   scopolamine (TRANSDERM-SCOP) 1 MG/3DAYS, Place 1 patch (1.5 mg total) onto the skin every 3 (three) days. If needed for motion illness, Disp: 4 patch, Rfl: 1   Semaglutide, 2 MG/DOSE, (OZEMPIC, 2 MG/DOSE,) 8 MG/3ML SOPN, Inject 2 mg into the skin once a week. Dosage change,  Disp: 3 mL, Rfl: 2   albuterol (VENTOLIN HFA) 108 (90 Base) MCG/ACT inhaler, Inhale 2 puffs into the lungs every 6 (six) hours as needed for wheezing or shortness of breath. (Patient not taking: Reported on 10/02/2023), Disp: 8 g, Rfl: 1   chlorhexidine (HIBICLENS) 4 % external liquid, Apply 15 mLs (1 Application total) topically as directed for 30 doses. Use as directed daily for 5 days every other week for 6 weeks. (Patient not taking: Reported on 01/22/2024), Disp: 946 mL, Rfl: 1   omeprazole (PRILOSEC) 20 MG capsule, Take 1 capsule (20 mg total) by mouth daily. (Patient not taking: Reported on 08/13/2023), Disp: 30 capsule, Rfl: 3  EXAM: BP Readings from Last 3 Encounters:  10/21/23 114/82  10/10/23 120/86  08/13/23 110/80   Wt Readings from Last 3 Encounters:  01/22/24 205 lb (93 kg)  10/21/23 210 lb (95.3 kg)  10/10/23 210 lb (95.3 kg)    VITALS per patient if applicable:  GENERAL: alert, oriented, appears well and in no acute distress  HEENT: atraumatic, conjunttiva clear, no obvious abnormalities on inspection of external nose and ears  NECK: normal movements of the head and neck  LUNGS: on inspection no signs of respiratory distress, breathing rate appears normal, no obvious gross SOB, gasping or wheezing  CV: no obvious cyanosis  MS: moves all visible extremities without noticeable abnormality  PSYCH/NEURO: pleasant and cooperative, no obvious depression or anxiety, speech and thought processing grossly intact Lab Results  Component Value Date   WBC 11.0 (H) 10/21/2023   HGB 13.0 10/21/2023   HCT 39.0 10/21/2023   PLT 182 10/21/2023   GLUCOSE 168 (H) 10/21/2023   CHOL 160 02/14/2023   TRIG 82.0 02/14/2023   HDL 46.80 02/14/2023   LDLDIRECT 122.0 02/15/2010   LDLCALC 97 02/14/2023   ALT 20 10/10/2023   AST 21 10/10/2023   NA 140 10/21/2023   K 4.1 10/21/2023   CL 108 10/21/2023   CREATININE 0.98 10/21/2023   BUN 14 10/21/2023   CO2 24 10/21/2023   TSH 1.99  02/14/2023   INR 1.02 04/18/2010   HGBA1C 6.0 (H) 10/10/2023   MICROALBUR 0.9 04/15/2023  Last vitamin D Lab Results  Component Value Date   VD25OH 36.83 11/06/2019     ASSESSMENT AND PLAN:  Discussed  the following assessment and plan:    ICD-10-CM   1. Controlled type 2 diabetes mellitus with complication, without long-term current use of insulin (HCC)  E11.8 Ambulatory referral to Pulmonology    Basic metabolic panel with GFR    CBC with Differential/Platelet    Hemoglobin A1c    Hepatic function panel    Lipid panel    TSH    Microalbumin / creatinine urine ratio    2. OSA (obstructive sleep apnea)  G47.33 Ambulatory referral to Pulmonology    Basic metabolic panel with GFR    CBC with Differential/Platelet    Hemoglobin A1c    Hepatic function panel    Lipid panel    TSH    Microalbumin / creatinine urine ratio    3. Medication management  Z79.899 Basic metabolic panel with GFR    CBC with Differential/Platelet    Hemoglobin A1c    Hepatic function panel    Lipid panel    TSH    Microalbumin / creatinine urine ratio    4. Hypothyroidism, unspecified type  E03.9 Basic metabolic panel with GFR    CBC with Differential/Platelet    Hemoglobin A1c    Hepatic function panel    Lipid panel    TSH    Microalbumin / creatinine urine ratio    5. Class 2 severe obesity with serious comorbidity and body mass index (BMI) of 37.0 to 37.9 in adult, unspecified obesity type (HCC)  W09.811 Basic metabolic panel with GFR   E66.01 CBC with Differential/Platelet   Z68.37 Hemoglobin A1c    Hepatic function panel    Lipid panel    TSH    Microalbumin / creatinine urine ratio     Future labs [facility]  then make  fu app  with me prefer in  person but could do virtual if needed  Refer back to pulm   Counseled.   Expectant management and discussion of plan and treatment with opportunity to ask questions and all were answered. The patient agreed with the plan and demonstrated an  understanding of the instructions.   Advised to call back or seek an in-person evaluation if worsening  or having  further concerns  in interim. Return for lab at [facility]  then appt with me .    Berniece Andreas, MD

## 2024-01-28 NOTE — Progress Notes (Unsigned)
 Virtual Visit via Video Note  I connected with Angelica Gray on 01/28/24 at  9:15 AM EDT by a video enabled telemedicine application and verified that I am speaking with the correct person using two identifiers. Location patient: home Location provider:work office Persons participating in the virtual visit: patient, provider   Patient aware  of the limitations of evaluation and management by telemedicine and  availability of in person appointments. and agreed to proceed.   HPI: Angelica Gray presents for video visit fu last visit  for med evaluation  4 9      ROS: See pertinent positives and negatives per HPI.  Past Medical History:  Diagnosis Date   Arthritis    Atrophic kidney left   Diabetes mellitus without complication (HCC)    History of hiatal hernia    History of renal stone 2004   Hyperlipidemia    Hypothyroidism    Insomnia    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 02/01/2015   Pneumonia    COVID   RESTLESS LEG SYNDROME, MILD 11/07/2007   Qualifier: Diagnosis of  By: Larrie Po MD, Wilmon Hashimoto    Sleep apnea    no CPAP     Past Surgical History:  Procedure Laterality Date   COLONOSCOPY     ESOPHAGOGASTRODUODENOSCOPY     HIATAL HERNIA REPAIR N/A 09/05/2015   Procedure: LAPAROSCOPIC REPAIR OF HIATAL HERNIA;  Surgeon: Juanita Norlander, MD;  Location: [facility];  Service: General;  Laterality: N/A;   KIDNEY STONE SURGERY     LASIK Bilateral    MANDIBLE FRACTURE SURGERY     REFRACTIVE SURGERY     TOTAL HIP ARTHROPLASTY Right 10/21/2023   Procedure: TOTAL HIP ARTHROPLASTY;  Surgeon: Murleen Arms, MD;  Location: [facility];  Service: Orthopedics;  Laterality: Right;   TUBAL LIGATION  10/16/1999    Family History  Problem Relation Age of Onset   Kidney failure Mother        from dm and ht  had 11 children    Hyperlipidemia Mother    Diabetes Mother    Thyroid disease Mother    Diabetes Father    Heart attack Father        dec age 58    Throat cancer Paternal Grandmother        dipped stuff   Esophageal cancer Paternal Grandmother    Thyroid disease Sister    Colon cancer Neg Hx    Stomach cancer Neg Hx     Social History   Tobacco Use   Smoking status: Never   Smokeless tobacco: Never  Vaping Use   Vaping status: Never Used  Substance Use Topics   Alcohol use: No    Alcohol/week: 0.0 standard drinks of alcohol   Drug use: No      Current Outpatient Medications:    albuterol (VENTOLIN HFA) 108 (90 Base) MCG/ACT inhaler, Inhale 2 puffs into the lungs every 6 (six) hours as needed for wheezing or shortness of breath. (Patient not taking: Reported on 10/02/2023), Disp: 8 g, Rfl: 1   Ascorbic Acid (VITAMIN C) 1000 MG tablet, Take 1,000 mg by mouth daily., Disp: , Rfl:    chlorhexidine (HIBICLENS) 4 % external liquid, Apply 15 mLs (1 Application total) topically as directed for 30 doses. Use as directed daily for 5 days every other week for 6 weeks. (Patient not taking: Reported on 01/22/2024), Disp: 946 mL, Rfl: 1   Cholecalciferol 50 MCG (2000 UT)  CAPS, Take 2,000 Units by mouth daily., Disp: , Rfl:    Cyanocobalamin (VITAMIN B 12 PO), Take 1 tablet by mouth daily., Disp: , Rfl:    fluticasone (FLONASE) 50 MCG/ACT nasal spray, Place 2 sprays into both nostrils daily. (Patient taking differently: Place 2 sprays into both nostrils daily as needed for allergies.), Disp: 16 g, Rfl: 6   levothyroxine (SYNTHROID) 112 MCG tablet, TAKE 1 TABLET BY MOUTH ONCE DAILY BEFORE  BREAKFAST, Disp: 90 tablet, Rfl: 0   magnesium 30 MG tablet, Take 30 mg by mouth daily. Pt unsure of dose, Disp: , Rfl:    NON FORMULARY, Pt uses a cpap nightly, Disp: , Rfl:    omeprazole (PRILOSEC) 20 MG capsule, Take 1 capsule (20 mg total) by mouth daily. (Patient not taking: Reported on 08/13/2023), Disp: 30 capsule, Rfl: 3   polyethylene glycol (MIRALAX) 17 g packet, Take 17 g by mouth daily., Disp: 14 each, Rfl: 0   scopolamine (TRANSDERM-SCOP) 1  MG/3DAYS, Place 1 patch (1.5 mg total) onto the skin every 3 (three) days. If needed for motion illness, Disp: 4 patch, Rfl: 1   Semaglutide, 2 MG/DOSE, (OZEMPIC, 2 MG/DOSE,) 8 MG/3ML SOPN, Inject 2 mg into the skin once a week. Dosage change, Disp: 3 mL, Rfl: 2  EXAM: BP Readings from Last 3 Encounters:  10/21/23 114/82  10/10/23 120/86  08/13/23 110/80    VITALS per patient if applicable:  GENERAL: alert, oriented, appears well and in no acute distress  HEENT: atraumatic, conjunttiva clear, no obvious abnormalities on inspection of external nose and ears  NECK: normal movements of the head and neck  LUNGS: on inspection no signs of respiratory distress, breathing rate appears normal, no obvious gross SOB, gasping or wheezing  CV: no obvious cyanosis  MS: moves all visible extremities without noticeable abnormality  PSYCH/NEURO: pleasant and cooperative, no obvious depression or anxiety, speech and thought processing grossly intact Lab Results  Component Value Date   WBC 11.0 (H) 10/21/2023   HGB 13.0 10/21/2023   HCT 39.0 10/21/2023   PLT 182 10/21/2023   GLUCOSE 168 (H) 10/21/2023   CHOL 160 02/14/2023   TRIG 82.0 02/14/2023   HDL 46.80 02/14/2023   LDLDIRECT 122.0 02/15/2010   LDLCALC 97 02/14/2023   ALT 20 10/10/2023   AST 21 10/10/2023   NA 140 10/21/2023   K 4.1 10/21/2023   CL 108 10/21/2023   CREATININE 0.98 10/21/2023   BUN 14 10/21/2023   CO2 24 10/21/2023   TSH 1.99 02/14/2023   INR 1.02 04/18/2010   HGBA1C 6.0 (H) 10/10/2023   MICROALBUR 0.9 04/15/2023    ASSESSMENT AND PLAN:  Discussed the following assessment and plan:  No diagnosis found.  Counseled.   Expectant management and discussion of plan and treatment with opportunity to ask questions and all were answered. The patient agreed with the plan and demonstrated an understanding of the instructions.   Advised to call back or seek an in-person evaluation if worsening  or having  further  concerns  in interim. No follow-ups on file.    Daphine Eagle, MD

## 2024-01-29 ENCOUNTER — Encounter: Payer: Self-pay | Admitting: Internal Medicine

## 2024-01-29 ENCOUNTER — Other Ambulatory Visit

## 2024-01-29 ENCOUNTER — Encounter: Admitting: Internal Medicine

## 2024-01-29 DIAGNOSIS — E039 Hypothyroidism, unspecified: Secondary | ICD-10-CM

## 2024-01-29 DIAGNOSIS — Z6837 Body mass index (BMI) 37.0-37.9, adult: Secondary | ICD-10-CM | POA: Diagnosis not present

## 2024-01-29 DIAGNOSIS — Z79899 Other long term (current) drug therapy: Secondary | ICD-10-CM | POA: Diagnosis not present

## 2024-01-29 DIAGNOSIS — E118 Type 2 diabetes mellitus with unspecified complications: Secondary | ICD-10-CM

## 2024-01-29 DIAGNOSIS — E66812 Obesity, class 2: Secondary | ICD-10-CM

## 2024-01-29 DIAGNOSIS — G4733 Obstructive sleep apnea (adult) (pediatric): Secondary | ICD-10-CM

## 2024-01-29 LAB — MICROALBUMIN / CREATININE URINE RATIO
Creatinine,U: 70.5 mg/dL
Microalb Creat Ratio: UNDETERMINED mg/g (ref 0.0–30.0)
Microalb, Ur: <0.7 mg/dL

## 2024-01-29 LAB — CBC WITH DIFFERENTIAL/PLATELET
Basophils Absolute: 0 10*3/uL (ref 0.0–0.1)
Basophils Relative: 0.5 % (ref 0.0–3.0)
Eosinophils Absolute: 0.1 10*3/uL (ref 0.0–0.7)
Eosinophils Relative: 2 % (ref 0.0–5.0)
HCT: 41.1 % (ref 36.0–46.0)
Hemoglobin: 13.8 g/dL (ref 12.0–15.0)
Lymphocytes Relative: 28.1 % (ref 12.0–46.0)
Lymphs Abs: 1.3 10*3/uL (ref 0.7–4.0)
MCHC: 33.4 g/dL (ref 30.0–36.0)
MCV: 88.5 fl (ref 78.0–100.0)
Monocytes Absolute: 0.4 10*3/uL (ref 0.1–1.0)
Monocytes Relative: 7.5 % (ref 3.0–12.0)
Neutro Abs: 2.9 10*3/uL (ref 1.4–7.7)
Neutrophils Relative %: 61.9 % (ref 43.0–77.0)
Platelets: 191 10*3/uL (ref 150.0–400.0)
RBC: 4.65 Mil/uL (ref 3.87–5.11)
RDW: 12.9 % (ref 11.5–15.5)
WBC: 4.7 10*3/uL (ref 4.0–10.5)

## 2024-01-29 LAB — HEMOGLOBIN A1C: Hgb A1c MFr Bld: 6 % (ref 4.6–6.5)

## 2024-01-29 LAB — HEPATIC FUNCTION PANEL
ALT: 14 U/L (ref 0–35)
AST: 15 U/L (ref 0–37)
Albumin: 4.3 g/dL (ref 3.5–5.2)
Alkaline Phosphatase: 129 U/L — ABNORMAL HIGH (ref 39–117)
Bilirubin, Direct: 0.1 mg/dL (ref 0.0–0.3)
Total Bilirubin: 0.7 mg/dL (ref 0.2–1.2)
Total Protein: 6.8 g/dL (ref 6.0–8.3)

## 2024-01-29 LAB — BASIC METABOLIC PANEL WITH GFR
BUN: 17 mg/dL (ref 6–23)
CO2: 23 meq/L (ref 19–32)
Calcium: 9.3 mg/dL (ref 8.4–10.5)
Chloride: 105 meq/L (ref 96–112)
Creatinine, Ser: 0.87 mg/dL (ref 0.40–1.20)
GFR: 71.45 mL/min (ref >60.00)
Glucose, Bld: 142 mg/dL — ABNORMAL HIGH (ref 70–99)
Potassium: 4.4 meq/L (ref 3.5–5.1)
Sodium: 139 meq/L (ref 135–145)

## 2024-01-29 LAB — LIPID PANEL
Cholesterol: 142 mg/dL (ref 0–200)
HDL: 45.5 mg/dL (ref >39.00)
LDL Cholesterol: 83 mg/dL (ref 0–99)
NonHDL: 96.53
Total CHOL/HDL Ratio: 3
Triglycerides: 69 mg/dL (ref 0.0–149.0)
VLDL: 13.8 mg/dL (ref 0.0–40.0)

## 2024-01-29 LAB — TSH: TSH: 0.55 u[IU]/mL (ref 0.35–5.50)

## 2024-01-30 ENCOUNTER — Other Ambulatory Visit: Payer: Self-pay | Admitting: Internal Medicine

## 2024-01-30 ENCOUNTER — Encounter: Payer: Self-pay | Admitting: Internal Medicine

## 2024-01-30 ENCOUNTER — Telehealth (INDEPENDENT_AMBULATORY_CARE_PROVIDER_SITE_OTHER): Admitting: Internal Medicine

## 2024-01-30 VITALS — Ht 66.0 in | Wt 204.0 lb

## 2024-01-30 DIAGNOSIS — R748 Abnormal levels of other serum enzymes: Secondary | ICD-10-CM

## 2024-01-30 DIAGNOSIS — E118 Type 2 diabetes mellitus with unspecified complications: Secondary | ICD-10-CM

## 2024-01-30 DIAGNOSIS — Z79899 Other long term (current) drug therapy: Secondary | ICD-10-CM

## 2024-01-30 DIAGNOSIS — E559 Vitamin D deficiency, unspecified: Secondary | ICD-10-CM

## 2024-01-30 DIAGNOSIS — E669 Obesity, unspecified: Secondary | ICD-10-CM

## 2024-01-30 DIAGNOSIS — E1169 Type 2 diabetes mellitus with other specified complication: Secondary | ICD-10-CM

## 2024-01-30 DIAGNOSIS — E039 Hypothyroidism, unspecified: Secondary | ICD-10-CM | POA: Diagnosis not present

## 2024-01-30 NOTE — Progress Notes (Signed)
 Fu labs  pre visit for cpe in summer  3 -4 months

## 2024-01-30 NOTE — Progress Notes (Signed)
 Virtual Visit via Video Note  I connected with Angelica Gray on 01/30/24 at 12:00 PM EDT by a video enabled telemedicine application and verified that I am speaking with the correct person using two identifiers. Location patient: outside  Location provider:work office Persons participating in the virtual visit: patient, provider   Patient aware  of the limitations of evaluation and management by telemedicine and  availability of in person appointments. and agreed to proceed.   HPI: Angelica Gray presents for video visit  fu monitoring labs and med check  Hasn't  started  the 2 mg semiglutin yet  leaves for cruise this weekend for 7 days . Thyroid   no change in dosing and taking reg  ? Can ever get off?   Began med when had goiter in pregnancy and on ever since . Takes vit d 2000 I u but no reg . Bpin good range     Considering getting other hip surgery  this year   ROS: See pertinent positives and negatives per HPI. No current cp sob    Past Medical History:  Diagnosis Date   Arthritis    Atrophic kidney left   Diabetes mellitus without complication (HCC)    History of hiatal hernia    History of renal stone 2004   Hyperlipidemia    Hypothyroidism    Insomnia    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 02/01/2015   Pneumonia    COVID   RESTLESS LEG SYNDROME, MILD 11/07/2007   Qualifier: Diagnosis of  By: Lovell Sheehan MD, Balinda Quails    Sleep apnea    no CPAP     Past Surgical History:  Procedure Laterality Date   COLONOSCOPY     ESOPHAGOGASTRODUODENOSCOPY     HIATAL HERNIA REPAIR N/A 09/05/2015   Procedure: LAPAROSCOPIC REPAIR OF HIATAL HERNIA;  Surgeon: Ovidio Kin, MD;  Location: [facility];  Service: General;  Laterality: N/A;   KIDNEY STONE SURGERY     LASIK Bilateral    MANDIBLE FRACTURE SURGERY     REFRACTIVE SURGERY     TOTAL HIP ARTHROPLASTY Right 10/21/2023   Procedure: TOTAL HIP ARTHROPLASTY;  Surgeon: Joen Laura, MD;   Location: [facility];  Service: Orthopedics;  Laterality: Right;   TUBAL LIGATION  10/16/1999    Family History  Problem Relation Age of Onset   Kidney failure Mother        from dm and ht  had 11 children    Hyperlipidemia Mother    Diabetes Mother    Thyroid disease Mother    Diabetes Father    Heart attack Father        dec age 10   Throat cancer Paternal Grandmother        dipped stuff   Esophageal cancer Paternal Grandmother    Thyroid disease Sister    Colon cancer Neg Hx    Stomach cancer Neg Hx     Social History   Tobacco Use   Smoking status: Never   Smokeless tobacco: Never  Vaping Use   Vaping status: Never Used  Substance Use Topics   Alcohol use: No    Alcohol/week: 0.0 standard drinks of alcohol   Drug use: No      Current Outpatient Medications:    albuterol (VENTOLIN HFA) 108 (90 Base) MCG/ACT inhaler, Inhale 2 puffs into the lungs every 6 (six) hours as needed for wheezing or shortness of breath., Disp: 8 g, Rfl: 1  Ascorbic Acid (VITAMIN C) 1000 MG tablet, Take 1,000 mg by mouth daily., Disp: , Rfl:    chlorhexidine (HIBICLENS) 4 % external liquid, Apply 15 mLs (1 Application total) topically as directed for 30 doses. Use as directed daily for 5 days every other week for 6 weeks., Disp: 946 mL, Rfl: 1   Cholecalciferol 50 MCG (2000 UT) CAPS, Take 2,000 Units by mouth daily., Disp: , Rfl:    Cyanocobalamin (VITAMIN B 12 PO), Take 1 tablet by mouth daily., Disp: , Rfl:    fluticasone (FLONASE) 50 MCG/ACT nasal spray, Place 2 sprays into both nostrils daily. (Patient taking differently: Place 2 sprays into both nostrils daily as needed for allergies.), Disp: 16 g, Rfl: 6   levothyroxine (SYNTHROID) 112 MCG tablet, TAKE 1 TABLET BY MOUTH ONCE DAILY BEFORE  BREAKFAST, Disp: 90 tablet, Rfl: 0   magnesium 30 MG tablet, Take 30 mg by mouth daily. Pt unsure of dose, Disp: , Rfl:    NON FORMULARY, Pt uses a cpap nightly, Disp: , Rfl:    omeprazole (PRILOSEC) 20 MG  capsule, Take 1 capsule (20 mg total) by mouth daily., Disp: 30 capsule, Rfl: 3   polyethylene glycol (MIRALAX) 17 g packet, Take 17 g by mouth daily., Disp: 14 each, Rfl: 0   scopolamine (TRANSDERM-SCOP) 1 MG/3DAYS, Place 1 patch (1.5 mg total) onto the skin every 3 (three) days. If needed for motion illness, Disp: 4 patch, Rfl: 1   Semaglutide, 2 MG/DOSE, (OZEMPIC, 2 MG/DOSE,) 8 MG/3ML SOPN, Inject 2 mg into the skin once a week. Dosage change, Disp: 3 mL, Rfl: 2  EXAM: BP Readings from Last 3 Encounters:  10/21/23 114/82  10/10/23 120/86  08/13/23 110/80   Wt Readings from Last 3 Encounters:  01/30/24 204 lb (92.5 kg)  01/22/24 205 lb (93 kg)  10/21/23 210 lb (95.3 kg)   Last vitamin D Lab Results  Component Value Date   VD25OH 36.83 11/06/2019     VITALS per patient if applicable:  GENERAL: alert, oriented, appears well and in no acute distress  HEENT: atraumatic, conjunttiva clear, no obvious abnormalities on inspection of external nose and ears  NECK: normal movements of the head and neck  LUNGS: on inspection no signs of respiratory distress, breathing rate appears normal, no obvious gross SOB, gasping or wheezing  CV: no obvious cyanosis  MS: moves all visible extremities without noticeable abnormality  PSYCH/NEURO: pleasant and cooperative, no obvious depression or anxiety, speech and thought processing grossly intact Lab Results  Component Value Date   WBC 4.7 01/29/2024   HGB 13.8 01/29/2024   HCT 41.1 01/29/2024   PLT 191.0 01/29/2024   GLUCOSE 142 (H) 01/29/2024   CHOL 142 01/29/2024   TRIG 69.0 01/29/2024   HDL 45.50 01/29/2024   LDLDIRECT 122.0 02/15/2010   LDLCALC 83 01/29/2024   ALT 14 01/29/2024   AST 15 01/29/2024   NA 139 01/29/2024   K 4.4 01/29/2024   CL 105 01/29/2024   CREATININE 0.87 01/29/2024   BUN 17 01/29/2024   CO2 23 01/29/2024   TSH 0.55 01/29/2024   INR 1.02 04/18/2010   HGBA1C 6.0 01/29/2024   MICROALBUR <0.7 01/29/2024     ASSESSMENT AND PLAN:  Discussed the following assessment and plan:    ICD-10-CM   1. Controlled type 2 diabetes mellitus with complication, without long-term current use of insulin (HCC)  E11.8     2. Hypothyroidism, unspecified type  E03.9     3. Medication management  Z79.899     4. Elevated alkaline phosphatase level  R74.8     5. Vitamin D deficiency  E55.9     6. Type 2 diabetes mellitus with obesity (HCC)  E11.69    E66.9      Reviewed labs  Ldl almost at goal on no meds  For elevated alk phos  Take vit d every day and plan vit d alk phos ggt A1c and tsh  in  3 mos  then due for cpe also then  Has nl gfr and no elevated protein  hx of atrophic kidney  Recheck tsh at next check   may want to keep range in 1-2 level  was 0.55 the time  Counseled.   Expectant management and discussion of plan and treatment with opportunity to ask questions and all were answered. The patient agreed with the plan and demonstrated an understanding of the instructions.   Advised to call back or seek an in-person evaluation if worsening  or having  further concerns  in interim. Return in about 3 months (around 04/30/2024) for preventive /cpx and medications lab pre visit  I will place orders .  Daphine Eagle, MD

## 2024-02-03 ENCOUNTER — Encounter (HOSPITAL_BASED_OUTPATIENT_CLINIC_OR_DEPARTMENT_OTHER): Payer: Self-pay

## 2024-02-09 ENCOUNTER — Other Ambulatory Visit: Payer: Self-pay | Admitting: Internal Medicine

## 2024-04-02 ENCOUNTER — Other Ambulatory Visit (HOSPITAL_COMMUNITY): Payer: Self-pay

## 2024-04-02 ENCOUNTER — Telehealth: Payer: Self-pay

## 2024-04-02 NOTE — Telephone Encounter (Signed)
 Pharmacy Patient Advocate Encounter   Received notification from CoverMyMeds that prior authorization for Ozempic   is required/requested.   Insurance verification completed.   The patient is insured through [employer] .   Per test claim: The current 28 day co-pay is, $0.00.  No PA needed at this time. This test claim was processed through [facility]- copay amounts may vary at other pharmacies due to pharmacy/plan contracts, or as the patient moves through the different stages of their insurance plan.

## 2024-04-26 ENCOUNTER — Other Ambulatory Visit: Payer: Self-pay | Admitting: Internal Medicine

## 2024-04-29 ENCOUNTER — Other Ambulatory Visit: Payer: Self-pay | Admitting: Internal Medicine

## 2024-05-24 ENCOUNTER — Other Ambulatory Visit: Payer: Self-pay | Admitting: Internal Medicine

## 2024-05-25 NOTE — Telephone Encounter (Signed)
 Attempted to reach pt. To remind her follow up appt is due. Voicemail is full.

## 2024-06-24 ENCOUNTER — Other Ambulatory Visit: Payer: Self-pay | Admitting: Internal Medicine

## 2024-07-28 ENCOUNTER — Other Ambulatory Visit: Payer: Self-pay | Admitting: Internal Medicine

## 2024-07-29 ENCOUNTER — Other Ambulatory Visit: Payer: Self-pay | Admitting: Internal Medicine

## 2024-07-30 ENCOUNTER — Encounter: Payer: Self-pay | Admitting: Internal Medicine

## 2024-07-30 ENCOUNTER — Ambulatory Visit
Admission: RE | Admit: 2024-07-30 | Discharge: 2024-07-30 | Disposition: A | Discharge status: Home / Self Care | Source: Ambulatory Visit | Attending: Internal Medicine | Admitting: Internal Medicine

## 2024-07-30 ENCOUNTER — Ambulatory Visit: Admitting: Internal Medicine

## 2024-07-30 ENCOUNTER — Other Ambulatory Visit: Payer: Self-pay | Admitting: Internal Medicine

## 2024-07-30 VITALS — BP 110/74 | HR 75 | Temp 97.5°F | Ht 65.5 in | Wt 202.2 lb

## 2024-07-30 DIAGNOSIS — Z23 Encounter for immunization: Secondary | ICD-10-CM

## 2024-07-30 DIAGNOSIS — Z79899 Other long term (current) drug therapy: Secondary | ICD-10-CM

## 2024-07-30 DIAGNOSIS — E559 Vitamin D deficiency, unspecified: Secondary | ICD-10-CM

## 2024-07-30 DIAGNOSIS — Z0000 Encounter for general adult medical examination without abnormal findings: Secondary | ICD-10-CM

## 2024-07-30 DIAGNOSIS — R739 Hyperglycemia, unspecified: Secondary | ICD-10-CM

## 2024-07-30 DIAGNOSIS — E785 Hyperlipidemia, unspecified: Secondary | ICD-10-CM

## 2024-07-30 DIAGNOSIS — E118 Type 2 diabetes mellitus with unspecified complications: Secondary | ICD-10-CM | POA: Diagnosis not present

## 2024-07-30 DIAGNOSIS — N6001 Solitary cyst of right breast: Secondary | ICD-10-CM

## 2024-07-30 DIAGNOSIS — Z1239 Encounter for other screening for malignant neoplasm of breast: Secondary | ICD-10-CM

## 2024-07-30 DIAGNOSIS — E039 Hypothyroidism, unspecified: Secondary | ICD-10-CM

## 2024-07-30 DIAGNOSIS — R748 Abnormal levels of other serum enzymes: Secondary | ICD-10-CM | POA: Diagnosis not present

## 2024-07-30 LAB — HEPATIC FUNCTION PANEL
ALT: 13 U/L (ref 0–35)
AST: 15 U/L (ref 0–37)
Albumin: 4.3 g/dL (ref 3.5–5.2)
Alkaline Phosphatase: 125 U/L — ABNORMAL HIGH (ref 39–117)
Bilirubin, Direct: 0.2 mg/dL (ref 0.0–0.3)
Total Bilirubin: 1 mg/dL (ref 0.2–1.2)
Total Protein: 7 g/dL (ref 6.0–8.3)

## 2024-07-30 LAB — LIPID PANEL
Cholesterol: 155 mg/dL (ref 0–200)
HDL: 52.6 mg/dL (ref >39.00)
LDL Cholesterol: 89 mg/dL (ref 0–99)
NonHDL: 102.83
Total CHOL/HDL Ratio: 3
Triglycerides: 69 mg/dL (ref 0.0–149.0)
VLDL: 13.8 mg/dL (ref 0.0–40.0)

## 2024-07-30 LAB — BASIC METABOLIC PANEL WITH GFR
BUN: 17 mg/dL (ref 6–23)
CO2: 26 meq/L (ref 19–32)
Calcium: 9.2 mg/dL (ref 8.4–10.5)
Chloride: 106 meq/L (ref 96–112)
Creatinine, Ser: 0.89 mg/dL (ref 0.40–1.20)
GFR: 69.28 mL/min (ref >60.00)
Glucose, Bld: 108 mg/dL — ABNORMAL HIGH (ref 70–99)
Potassium: 4.2 meq/L (ref 3.5–5.1)
Sodium: 140 meq/L (ref 135–145)

## 2024-07-30 LAB — VITAMIN D 25 HYDROXY (VIT D DEFICIENCY, FRACTURES): VITD: 23.82 ng/mL — ABNORMAL LOW (ref 30.00–100.00)

## 2024-07-30 LAB — GAMMA GT: GGT: 11 U/L (ref 7–51)

## 2024-07-30 LAB — HEMOGLOBIN A1C: Hgb A1c MFr Bld: 6 % (ref 4.6–6.5)

## 2024-07-30 LAB — T4, FREE: Free T4: 0.87 ng/dL (ref 0.60–1.60)

## 2024-07-30 LAB — TSH: TSH: 2.03 u[IU]/mL (ref 0.35–5.50)

## 2024-07-30 MED ORDER — OZEMPIC (2 MG/DOSE) 8 MG/3ML ~~LOC~~ SOPN: PEN_INJECTOR | SUBCUTANEOUS | 0 refills | Status: DC

## 2024-07-30 MED ORDER — LEVOTHYROXINE SODIUM 112 MCG PO TABS
112.0000 ug | ORAL_TABLET | Freq: Every day | ORAL | 1 refills | Status: AC
Start: 2024-07-30 — End: ?

## 2024-07-30 NOTE — Progress Notes (Unsigned)
 Chief Complaint  Patient presents with   Annual Exam   Medication Refill    HPI: Patient  Angelica Gray  63 y.o. comes in today for Preventive Health Care visit  and Chronic disease management   Hip surgery .  Recovered  but to have other replaced  Right   side to have left .  Eye  doc utd  1000 vit d  Thyroid  med same  Ozempic   2 x 4 months   no sig   weight change recently.    No new resp sx  Dm obesity :  on ozempic  2 for months  weight no longer  lowering but doing ok would like to get under 200    Health Maintenance  Topic Date Due   HIV Screening  Never done   Mammogram  07/29/2023   Influenza Vaccine  05/15/2024   COVID-19 Vaccine (4 - 2025-26 season) 08/15/2024 (Originally 06/15/2024)   Pneumococcal Vaccine: 50+ Years (1 of 2 - PCV) 07/30/2025 (Originally 09/28/1980)   Diabetic kidney evaluation - eGFR measurement  01/28/2025   Diabetic kidney evaluation - Urine ACR  01/28/2025   Colonoscopy  01/31/2025   Cervical Cancer Screening (HPV/Pap Cotest)  02/14/2028   DTaP/Tdap/Td (8 - Td or Tdap) 08/12/2030   Hepatitis B Vaccines 19-59 Average Risk  Completed   Hepatitis C Screening  Completed   Zoster Vaccines- Shingrix   Completed   HPV VACCINES  Aged Out   Meningococcal B Vaccine  Aged Out   Health Maintenance Review LIFESTYLE:  Exercise:  active  Tobacco/ETS: n Alcohol:  n Sugar beverages:  sometimes tea   Sleep: 6 -7  on cpap  Drug use: no HH of   3  outside  dog and  inside  cat  Work:  crazy    ROS:  GEN/ HEENT: No fever, significant weight changes sweats headaches vision problems hearing changes, CV/ PULM; No chest pain shortness of breath cough, syncope,edema  change in exercise tolerance. GI /GU: No adominal pain, vomiting, change in bowel habits. No blood in the stool. No significant GU symptoms. SKIN/HEME: ,no acute skin rashes suspicious lesions or bleeding. No lymphadenopathy, nodules, masses.  NEURO/ PSYCH:  No neurologic signs such as weakness  numbness. No depression anxiety. IMM/ Allergy: No unusual infections.  Allergy .   REST of 12 system review negative except as per HPI   Past Medical History:  Diagnosis Date   Arthritis    Atrophic kidney left   Diabetes mellitus without complication (HCC)    History of hiatal hernia    History of renal stone 2004   Hyperlipidemia    Hypothyroidism    Insomnia    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 02/01/2015   Pneumonia    COVID   RESTLESS LEG SYNDROME, MILD 11/07/2007   Qualifier: Diagnosis of  By: Mavis MD, Norleen BRAVO    Sleep apnea    no CPAP     Past Surgical History:  Procedure Laterality Date   COLONOSCOPY     ESOPHAGOGASTRODUODENOSCOPY     HIATAL HERNIA REPAIR N/A 09/05/2015   Procedure: LAPAROSCOPIC REPAIR OF HIATAL HERNIA;  Surgeon: Alm Angle, MD;  Location: [facility];  Service: General;  Laterality: N/A;   KIDNEY STONE SURGERY     LASIK Bilateral    MANDIBLE FRACTURE SURGERY     REFRACTIVE SURGERY     TOTAL HIP ARTHROPLASTY Right 10/21/2023   Procedure: TOTAL HIP ARTHROPLASTY;  Surgeon: Edna,  Toribio LABOR, MD;  Location: [facility];  Service: Orthopedics;  Laterality: Right;   TUBAL LIGATION  10/16/1999    Family History  Problem Relation Age of Onset   Kidney failure Mother        from dm and ht  had 11 children    Hyperlipidemia Mother    Diabetes Mother    Thyroid  disease Mother    Diabetes Father    Heart attack Father        dec age 47   Throat cancer Paternal Grandmother        dipped stuff   Esophageal cancer Paternal Grandmother    Thyroid  disease Sister    Colon cancer Neg Hx    Stomach cancer Neg Hx     Social History   Socioeconomic History   Marital status: Married    Spouse name: Not on file   Number of children: 4   Years of education: Not on file   Highest education level: Not on file  Occupational History   Occupation: special events planner    Comment: Visual merchandiser  Tobacco Use   Smoking  status: Never   Smokeless tobacco: Never  Vaping Use   Vaping status: Never Used  Substance and Sexual Activity   Alcohol use: No    Alcohol/week: 0.0 standard drinks of alcohol   Drug use: No   Sexual activity: Not Currently  Other Topics Concern   Not on file  Social History Narrative   Works Forensic scientist   HH of  5     no tob  And    G4P4neg tad    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   Social Drivers of Health   Financial Resource Strain: Not on file  Food Insecurity: Not on file  Transportation Needs: Not on file  Physical Activity: Insufficiently Active (02/14/2023)   Exercise Vital Sign    Days of Exercise per Week: 1 day    Minutes of Exercise per Session: 30 min  Stress: No Stress Concern Present (02/14/2023)   [employer] of Occupational Health - Occupational Stress Questionnaire    Feeling of Stress : Only a little  Social Connections: Socially Integrated (02/14/2023)   Social Connection and Isolation Panel    Frequency of Communication with Friends and Family: More than three times a week    Frequency of Social Gatherings with Friends and Family: More than three times a week    Attends Religious Services: More than 4 times per year    Active Member of [employer] or Organizations: Yes    Attends Engineer, structural: More than 4 times per year    Marital Status: Married    Outpatient Medications Prior to Visit  Medication Sig Dispense Refill   Ascorbic Acid (VITAMIN C) 1000 MG tablet Take 1,000 mg by mouth daily.     Cholecalciferol 50 MCG (2000 UT) CAPS Take 2,000 Units by mouth daily.     Cyanocobalamin  (VITAMIN B 12 PO) Take 1 tablet by mouth daily.     magnesium 30 MG tablet Take 30 mg by mouth daily. Pt unsure of dose     levothyroxine  (SYNTHROID ) 112 MCG tablet TAKE 1 TABLET BY MOUTH ONCE DAILY BEFORE BREAKFAST 90 tablet 0   OZEMPIC , 2 MG/DOSE, 8 MG/3ML SOPN INJECT 2 MG  SUBCUTANEOUSLY ONCE A WEEK **DOSE  CHANGE** 3  mL 0   albuterol  (VENTOLIN  HFA) 108 (90 Base) MCG/ACT inhaler Inhale 2 puffs into  the lungs every 6 (six) hours as needed for wheezing or shortness of breath. (Patient not taking: Reported on 07/30/2024) 8 g 1   chlorhexidine  (HIBICLENS ) 4 % external liquid Apply 15 mLs (1 Application total) topically as directed for 30 doses. Use as directed daily for 5 days every other week for 6 weeks. (Patient not taking: Reported on 07/30/2024) 946 mL 1   fluticasone  (FLONASE ) 50 MCG/ACT nasal spray Place 2 sprays into both nostrils daily. (Patient not taking: Reported on 07/30/2024) 16 g 6   NON FORMULARY Pt uses a cpap nightly     omeprazole  (PRILOSEC) 20 MG capsule Take 1 capsule (20 mg total) by mouth daily. (Patient not taking: Reported on 07/30/2024) 30 capsule 3   polyethylene glycol (MIRALAX ) 17 g packet Take 17 g by mouth daily. (Patient not taking: Reported on 07/30/2024) 14 each 0   scopolamine  (TRANSDERM-SCOP) 1 MG/3DAYS Place 1 patch (1.5 mg total) onto the skin every 3 (three) days. If needed for motion illness (Patient not taking: Reported on 07/30/2024) 4 patch 1   No facility-administered medications prior to visit.     EXAM:  BP 110/74 (BP Location: Right Arm, Patient Position: Sitting, Cuff Size: Normal)   Pulse 75   Temp (!) 97.5 F (36.4 C) (Oral)   Ht 5' 5.5 (1.664 m)   Wt 202 lb 3.2 oz (91.7 kg)   SpO2 97%   BMI 33.14 kg/m   Body mass index is 33.14 kg/m. Wt Readings from Last 3 Encounters:  07/30/24 202 lb 3.2 oz (91.7 kg)  01/30/24 204 lb (92.5 kg)  01/22/24 205 lb (93 kg)    Physical Exam: Vital signs reviewed HZW:Uypd is a well-developed well-nourished alert cooperative    who appearsr stated age in no acute distress.  HEENT: normocephalic atraumatic , Eyes: PERRL EOM's full, conjunctiva clear, Nares: paten,t no deformity discharge or tenderness., Ears: no deformity EAC's clear TMs with normal landmarks. Mouth: clear OP, no lesions, edema.  Moist mucous membranes.  Dentition in adequate repair. NECK: supple without masses, thyromegaly or bruits. CHEST/PULM:  Clear to auscultation and percussion breath sounds equal no wheeze , rales or rhonchi. No chest wall deformities or tenderness. Breast: normal by inspection . No dimpling, discharge, masses, tenderness or discharge . CV: PMI is nondisplaced, S1 S2 no gallops, murmurs, rubs. Peripheral pulses are full without delay.No JVD .  ABDOMEN: Bowel sounds normal nontender  No guard or rebound, no hepato splenomegal no CVA tenderness.  No hernia. Extremtities:  No clubbing cyanosis or edema, no acute joint swelling or redness no focal atrophy NEURO:  Oriented x3, cranial nerves 3-12 appear to be intact, no obvious focal weakness,gait within normal limits no abnormal reflexes or asymmetrical SKIN: No acute rashes normal turgor, color, no bruising or petechiae. PSYCH: Oriented, good eye contact, no obvious depression anxiety, cognition and judgment appear normal. LN: no cervical axillary inguinal adenopathy  Lab Results  Component Value Date   WBC 4.7 01/29/2024   HGB 13.8 01/29/2024   HCT 41.1 01/29/2024   PLT 191.0 01/29/2024   GLUCOSE 142 (H) 01/29/2024   CHOL 142 01/29/2024   TRIG 69.0 01/29/2024   HDL 45.50 01/29/2024   LDLDIRECT 122.0 02/15/2010   LDLCALC 83 01/29/2024   ALT 14 01/29/2024   AST 15 01/29/2024   NA 139 01/29/2024   K 4.4 01/29/2024   CL 105 01/29/2024   CREATININE 0.87 01/29/2024   BUN 17 01/29/2024   CO2 23 01/29/2024   TSH 0.55 01/29/2024  INR 1.02 04/18/2010   HGBA1C 6.0 01/29/2024   MICROALBUR <0.7 01/29/2024    BP Readings from Last 3 Encounters:  07/30/24 110/74  10/21/23 114/82  10/10/23 120/86    Lab results reviewed with patient   ASSESSMENT AND PLAN:  Discussed the following assessment and plan:    ICD-10-CM   1. Influenza vaccine needed  Z23 Flu vaccine trivalent PF, 6mos and older(Flulaval,Afluria,Fluarix,Fluzone)    2. Medication management  Z79.899  Hemoglobin A1c    Hepatic function panel    TSH    T4, free    VITAMIN D  25 Hydroxy (Vit-D Deficiency, Fractures)    Gamma GT    Hemoglobin A1c    Lipid panel    Basic metabolic panel with GFR    3. Hyperlipidemia, unspecified hyperlipidemia type  E78.5 Hemoglobin A1c    Lipid panel    Basic metabolic panel with GFR    4. Hyperglycemia  R73.9 Hemoglobin A1c    Lipid panel    Basic metabolic panel with GFR    5. Hypothyroidism, unspecified type  E03.9 Hepatic function panel    TSH    T4, free    VITAMIN D  25 Hydroxy (Vit-D Deficiency, Fractures)    Gamma GT    Hemoglobin A1c    6. Vitamin D  deficiency  E55.9 Hepatic function panel    TSH    T4, free    VITAMIN D  25 Hydroxy (Vit-D Deficiency, Fractures)    Gamma GT    Hemoglobin A1c    7. Elevated alkaline phosphatase level  R74.8 Hepatic function panel    TSH    T4, free    VITAMIN D  25 Hydroxy (Vit-D Deficiency, Fractures)    Gamma GT    Hemoglobin A1c    8. Controlled type 2 diabetes mellitus with complication, without long-term current use of insulin  (HCC)  E11.8 Hepatic function panel    TSH    T4, free    VITAMIN D  25 Hydroxy (Vit-D Deficiency, Fractures)    Gamma GT    Hemoglobin A1c    9. Encounter for screening for malignant neoplasm of breast, unspecified screening modality  Z12.39 MM 3D SCREENING MAMMOGRAM BILATERAL BREAST     No follow-ups on file.  Patient Care Team: Malka Bocek, Apolinar POUR, MD as PCP - General (Internal Medicine) Lavona Agent, MD as PCP - Cardiology (Cardiology) Avram Lupita BRAVO, MD as Consulting Physician (Gastroenterology) There are no Patient Instructions on file for this visit.  Jannell Franta K. Lygia Olaes M.D.

## 2024-07-30 NOTE — Patient Instructions (Addendum)
 Same meds for now  Is food getting stucj feeling on going get fu with dr Avram or GI team .   Awaiting  lab results   Consider  mounjaro change   for weight loss effect depending .   Plan fu in 5-6 months or as needed depending on medication  monitoring

## 2024-08-03 ENCOUNTER — Ambulatory Visit: Payer: Self-pay | Admitting: Internal Medicine

## 2024-08-03 DIAGNOSIS — E559 Vitamin D deficiency, unspecified: Secondary | ICD-10-CM

## 2024-08-03 MED ORDER — VITAMIN D (ERGOCALCIFEROL) 1.25 MG (50000 UNIT) PO CAPS: 50000.0000 [IU] | ORAL_CAPSULE | ORAL | 0 refills | Status: DC

## 2024-08-03 NOTE — Progress Notes (Signed)
 A1c is in controlled range 6.0 Lipids almost at goal  ( ldl 70 best)  Alk phos lab  mild elevation could be from somewhat low VIT D   since you are taking 2000 I u per day  we need to boost  . Temporarily .   Please send in Vit d 50,000 I u  disp 8 take weekly ( instead of otc ) after 8 weeks change back to OTC 2000 -4000 I U  per day and then we can  recheck  level in 4-6 months ( vit D alk phos )  .  At next refill  of  ozempic   send in message to see if still want to try mounjaro switch

## 2024-08-06 ENCOUNTER — Ambulatory Visit
Admission: RE | Admit: 2024-08-06 | Discharge: 2024-08-06 | Disposition: A | Discharge status: Home / Self Care | Source: Ambulatory Visit | Attending: Internal Medicine | Admitting: Internal Medicine

## 2024-08-06 ENCOUNTER — Ambulatory Visit
Admission: RE | Admit: 2024-08-06 | Discharge: 2024-08-06 | Disposition: A | Source: Ambulatory Visit | Attending: Internal Medicine | Admitting: Internal Medicine

## 2024-08-06 ENCOUNTER — Ambulatory Visit: Payer: Self-pay | Admitting: Internal Medicine

## 2024-08-06 DIAGNOSIS — N6001 Solitary cyst of right breast: Secondary | ICD-10-CM

## 2024-08-11 ENCOUNTER — Other Ambulatory Visit

## 2024-08-11 ENCOUNTER — Encounter

## 2024-08-11 ENCOUNTER — Telehealth: Payer: Self-pay

## 2024-08-11 NOTE — Telephone Encounter (Signed)
 Form completed and signed   ok for surgery .  Please send copy of last visit oct 16 and copy of labs from that day .

## 2024-08-11 NOTE — Telephone Encounter (Signed)
 Pt's surgical clearance form is placed in provider's red folder.

## 2024-08-12 NOTE — Telephone Encounter (Signed)
 Form is faxed along with office notes and lab. Attempted to reach pt. Full voicemail.

## 2024-08-13 NOTE — Telephone Encounter (Signed)
 Attempted to reach pt. Left a detail message that form was faxed and if have any questions to call us  back.

## 2024-08-24 ENCOUNTER — Other Ambulatory Visit: Payer: Self-pay | Admitting: Internal Medicine

## 2024-09-03 NOTE — Progress Notes (Signed)
 Sent message, via epic in basket, requesting orders in epic from Careers adviser.

## 2024-09-07 NOTE — Progress Notes (Signed)
 Surgery orders requested via Epic inbox.

## 2024-09-08 ENCOUNTER — Ambulatory Visit: Payer: Self-pay | Admitting: Emergency Medicine

## 2024-09-08 DIAGNOSIS — G8929 Other chronic pain: Secondary | ICD-10-CM

## 2024-09-08 NOTE — H&P (Signed)
 TOTAL HIP ADMISSION H&P  Patient is admitted for left total hip arthroplasty.  Subjective:  Chief Complaint: left hip pain  HPI: Angelica Gray, 63 y.o. female, has a history of pain and functional disability in the left hip(s) due to arthritis and patient has failed non-surgical conservative treatments for greater than 12 weeks to include NSAID's and/or analgesics, corticosteriod injections, supervised PT with diminished ADL's post treatment, and activity modification.  Onset of symptoms was gradual starting a few years ago with gradually worsening course since that time.The patient noted no past surgery on the left hip(s).  Patient currently rates pain in the left hip at 8 out of 10 with activity. Patient has night pain, worsening of pain with activity and weight bearing, pain that interfers with activities of daily living, and pain with passive range of motion. Patient has evidence of periarticular osteophytes and joint space narrowing by imaging studies. This condition presents safety issues increasing the risk of falls.   There is no current active infection.  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 02/01/2015   Hiatal hernia 05/17/2010   CHEST WALL PAIN, ANTERIOR 05/17/2010   Vitamin D  deficiency 02/15/2010   HYPOGLYCEMIA 11/15/2009   HYDRONEPHROSIS, RIGHT 10/22/2008   Other and unspecified hyperlipidemia 11/07/2007   RESTLESS LEG SYNDROME, MILD 11/07/2007   MYOFASCIAL PAIN SYNDROME 11/07/2007   LIVER FUNCTION TESTS, ABNORMAL 11/07/2007    Hypothyroidism 09/23/2007   Menopausal and postmenopausal disorder 09/23/2007   MALAISE AND FATIGUE 09/23/2007   Past Medical History:  Diagnosis Date   Arthritis    Atrophic kidney left   Diabetes mellitus without complication (HCC)    History of hiatal hernia    History of renal stone 2004   Hyperlipidemia    Hypothyroidism    Insomnia    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 02/01/2015   Pneumonia    COVID   RESTLESS LEG SYNDROME, MILD 11/07/2007   Qualifier: Diagnosis of  By: Mavis MD, Norleen BRAVO    Sleep apnea    no CPAP     Past Surgical History:  Procedure Laterality Date   COLONOSCOPY     ESOPHAGOGASTRODUODENOSCOPY     HIATAL HERNIA REPAIR N/A 09/05/2015   Procedure: LAPAROSCOPIC REPAIR OF HIATAL HERNIA;  Surgeon: Alm Angle, MD;  Location: [facility];  Service: General;  Laterality: N/A;   KIDNEY STONE SURGERY     LASIK Bilateral    MANDIBLE FRACTURE SURGERY     REFRACTIVE SURGERY     TOTAL HIP ARTHROPLASTY Right 10/21/2023   Procedure: TOTAL HIP ARTHROPLASTY;  Surgeon: Edna Toribio LABOR, MD;  Location: [facility];  Service: Orthopedics;  Laterality: Right;   TUBAL LIGATION  10/16/1999    Current Outpatient Medications  Medication Sig Dispense Refill Last Dose/Taking   albuterol  (VENTOLIN  HFA) 108 (90 Base) MCG/ACT inhaler Inhale 2 puffs into the lungs every 6 (six) hours as needed for wheezing or shortness of breath. (Patient not taking: No sig reported) 8 g 1    Ascorbic Acid (  VITAMIN C) 1000 MG tablet Take 1,000 mg by mouth daily.      chlorhexidine  (HIBICLENS ) 4 % external liquid Apply 15 mLs (1 Application total) topically as directed for 30 doses. Use as directed daily for 5 days every other week for 6 weeks. (Patient not taking: No sig reported) 946 mL 1    Cyanocobalamin  (VITAMIN B 12 PO) Take 1 tablet by mouth daily.      fluticasone  (FLONASE ) 50 MCG/ACT nasal spray Place 2 sprays into both nostrils daily. (Patient not taking: No sig  reported) 16 g 6    levothyroxine  (SYNTHROID ) 112 MCG tablet Take 1 tablet (112 mcg total) by mouth daily before breakfast. 90 tablet 1    MAGNESIUM PO Take 1 tablet by mouth daily.      NON FORMULARY Pt uses a cpap nightly      omeprazole  (PRILOSEC) 20 MG capsule Take 1 capsule (20 mg total) by mouth daily. (Patient not taking: No sig reported) 30 capsule 3    polyethylene glycol (MIRALAX ) 17 g packet Take 17 g by mouth daily. (Patient not taking: No sig reported) 14 each 0    scopolamine  (TRANSDERM-SCOP) 1 MG/3DAYS Place 1 patch (1.5 mg total) onto the skin every 3 (three) days. If needed for motion illness (Patient not taking: No sig reported) 4 patch 1    Semaglutide , 2 MG/DOSE, (OZEMPIC , 2 MG/DOSE,) 8 MG/3ML SOPN INJECT 2 MG  SUBCUTANEOUSLY ONCE A WEEK 3 mL 2    Vitamin D , Ergocalciferol , (DRISDOL ) 1.25 MG (50000 UNIT) CAPS capsule Take 1 capsule (50,000 Units total) by mouth every 7 (seven) days. 8 capsule 0    No current facility-administered medications for this visit.   No Known Allergies  Social History   Tobacco Use   Smoking status: Never   Smokeless tobacco: Never  Substance Use Topics   Alcohol use: No    Alcohol/week: 0.0 standard drinks of alcohol    Family History  Problem Relation Age of Onset   Kidney failure Mother        from dm and ht  had 11 children    Hyperlipidemia Mother    Diabetes Mother    Thyroid  disease Mother    Diabetes Father    Heart attack Father        dec age 73   Thyroid  disease Sister    Throat cancer Paternal Grandmother        dipped stuff   Esophageal cancer Paternal Grandmother    Colon cancer Neg Hx    Stomach cancer Neg Hx    Breast cancer Neg Hx      Review of Systems  Musculoskeletal:  Positive for arthralgias.  All other systems reviewed and are negative.   Objective:  Physical Exam Constitutional:      General: She is not in acute distress.    Appearance: Normal appearance. She is not ill-appearing.  HENT:      Head: Normocephalic and atraumatic.     Right Ear: External ear normal.     Left Ear: External ear normal.     Nose: Nose normal.     Mouth/Throat:     Mouth: Mucous membranes are moist.     Pharynx: Oropharynx is clear.  Eyes:     Extraocular Movements: Extraocular movements intact.     Conjunctiva/sclera: Conjunctivae normal.  Cardiovascular:     Rate and Rhythm: Normal rate.     Pulses: Normal pulses.  Pulmonary:     Effort: Pulmonary effort  is normal.  Abdominal:     General: Bowel sounds are normal.     Palpations: Abdomen is soft.  Musculoskeletal:        General: Tenderness present.     Cervical back: Normal range of motion and neck supple.     Comments: TTP over groin, lateral aspect, greater trochanter.  Mild IT band tenderness.  No significant swelling.  No overlying lesions of area of chief complaint.  Decreased strength and ROM due to elicited pain.  Dorsiflexion and plantarflexion intact.  BLE appear grossly neurovascularly intact.  Gait mildly antalgic.   Skin:    General: Skin is warm and dry.  Neurological:     Mental Status: She is alert and oriented to person, place, and time. Mental status is at baseline.  Psychiatric:        Mood and Affect: Mood normal.        Behavior: Behavior normal.     Vital signs in last 24 hours: @VSRANGES @  Labs:   Estimated body mass index is 33.14 kg/m as calculated from the following:   Height as of 07/30/24: 5' 5.5 (1.664 m).   Weight as of 07/30/24: 91.7 kg.   Imaging Review Plain radiographs demonstrate severe degenerative joint disease of the left hip(s). The bone quality appears to be fair for age and reported activity level.      Assessment/Plan:  End stage arthritis, left hip(s)  The patient history, physical examination, clinical judgement of the provider and imaging studies are consistent with end stage degenerative joint disease of the left hip(s) and total hip arthroplasty is deemed medically  necessary. The treatment options including medical management, injection therapy, arthroscopy and arthroplasty were discussed at length. The risks and benefits of total hip arthroplasty were presented and reviewed. The risks due to aseptic loosening, infection, stiffness, dislocation/subluxation,  thromboembolic complications and other imponderables were discussed.  The patient acknowledged the explanation, agreed to proceed with the plan and consent was signed. Patient is being admitted for inpatient treatment for surgery, pain control, PT, OT, prophylactic antibiotics, VTE prophylaxis, progressive ambulation and ADL's and discharge planning.The patient is planning to be discharged home with OPPT    Patient's anticipated LOS is less than 2 midnights, meeting these requirements: - Younger than 71 - Lives within 1 hour of care - Has a competent adult at home to recover with post-op recover - NO history of  - Chronic pain requiring opiods  - Coronary Artery Disease  - Heart failure  - Heart attack  - Stroke  - DVT/VTE  - Cardiac arrhythmia  - Respiratory Failure/COPD  - Renal failure  - Advanced Liver disease

## 2024-09-08 NOTE — H&P (View-Only) (Signed)
 TOTAL HIP ADMISSION H&P  Patient is admitted for left total hip arthroplasty.  Subjective:  Chief Complaint: left hip pain  HPI: Angelica Gray, 63 y.o. female, has a history of pain and functional disability in the left hip(s) due to arthritis and patient has failed non-surgical conservative treatments for greater than 12 weeks to include NSAID's and/or analgesics, corticosteriod injections, supervised PT with diminished ADL's post treatment, and activity modification.  Onset of symptoms was gradual starting a few years ago with gradually worsening course since that time.The patient noted no past surgery on the left hip(s).  Patient currently rates pain in the left hip at 8 out of 10 with activity. Patient has night pain, worsening of pain with activity and weight bearing, pain that interfers with activities of daily living, and pain with passive range of motion. Patient has evidence of periarticular osteophytes and joint space narrowing by imaging studies. This condition presents safety issues increasing the risk of falls.   There is no current active infection.  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 02/01/2015   Hiatal hernia 05/17/2010   CHEST WALL PAIN, ANTERIOR 05/17/2010   Vitamin D  deficiency 02/15/2010   HYPOGLYCEMIA 11/15/2009   HYDRONEPHROSIS, RIGHT 10/22/2008   Other and unspecified hyperlipidemia 11/07/2007   RESTLESS LEG SYNDROME, MILD 11/07/2007   MYOFASCIAL PAIN SYNDROME 11/07/2007   LIVER FUNCTION TESTS, ABNORMAL 11/07/2007    Hypothyroidism 09/23/2007   Menopausal and postmenopausal disorder 09/23/2007   MALAISE AND FATIGUE 09/23/2007   Past Medical History:  Diagnosis Date   Arthritis    Atrophic kidney left   Diabetes mellitus without complication (HCC)    History of hiatal hernia    History of renal stone 2004   Hyperlipidemia    Hypothyroidism    Insomnia    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 02/01/2015   Pneumonia    COVID   RESTLESS LEG SYNDROME, MILD 11/07/2007   Qualifier: Diagnosis of  By: Mavis MD, Norleen BRAVO    Sleep apnea    no CPAP     Past Surgical History:  Procedure Laterality Date   COLONOSCOPY     ESOPHAGOGASTRODUODENOSCOPY     HIATAL HERNIA REPAIR N/A 09/05/2015   Procedure: LAPAROSCOPIC REPAIR OF HIATAL HERNIA;  Surgeon: Alm Angle, MD;  Location: [facility];  Service: General;  Laterality: N/A;   KIDNEY STONE SURGERY     LASIK Bilateral    MANDIBLE FRACTURE SURGERY     REFRACTIVE SURGERY     TOTAL HIP ARTHROPLASTY Right 10/21/2023   Procedure: TOTAL HIP ARTHROPLASTY;  Surgeon: Edna Toribio LABOR, MD;  Location: [facility];  Service: Orthopedics;  Laterality: Right;   TUBAL LIGATION  10/16/1999    Current Outpatient Medications  Medication Sig Dispense Refill Last Dose/Taking   albuterol  (VENTOLIN  HFA) 108 (90 Base) MCG/ACT inhaler Inhale 2 puffs into the lungs every 6 (six) hours as needed for wheezing or shortness of breath. (Patient not taking: No sig reported) 8 g 1    Ascorbic Acid (  VITAMIN C) 1000 MG tablet Take 1,000 mg by mouth daily.      chlorhexidine  (HIBICLENS ) 4 % external liquid Apply 15 mLs (1 Application total) topically as directed for 30 doses. Use as directed daily for 5 days every other week for 6 weeks. (Patient not taking: No sig reported) 946 mL 1    Cyanocobalamin  (VITAMIN B 12 PO) Take 1 tablet by mouth daily.      fluticasone  (FLONASE ) 50 MCG/ACT nasal spray Place 2 sprays into both nostrils daily. (Patient not taking: No sig  reported) 16 g 6    levothyroxine  (SYNTHROID ) 112 MCG tablet Take 1 tablet (112 mcg total) by mouth daily before breakfast. 90 tablet 1    MAGNESIUM PO Take 1 tablet by mouth daily.      NON FORMULARY Pt uses a cpap nightly      omeprazole  (PRILOSEC) 20 MG capsule Take 1 capsule (20 mg total) by mouth daily. (Patient not taking: No sig reported) 30 capsule 3    polyethylene glycol (MIRALAX ) 17 g packet Take 17 g by mouth daily. (Patient not taking: No sig reported) 14 each 0    scopolamine  (TRANSDERM-SCOP) 1 MG/3DAYS Place 1 patch (1.5 mg total) onto the skin every 3 (three) days. If needed for motion illness (Patient not taking: No sig reported) 4 patch 1    Semaglutide , 2 MG/DOSE, (OZEMPIC , 2 MG/DOSE,) 8 MG/3ML SOPN INJECT 2 MG  SUBCUTANEOUSLY ONCE A WEEK 3 mL 2    Vitamin D , Ergocalciferol , (DRISDOL ) 1.25 MG (50000 UNIT) CAPS capsule Take 1 capsule (50,000 Units total) by mouth every 7 (seven) days. 8 capsule 0    No current facility-administered medications for this visit.   No Known Allergies  Social History   Tobacco Use   Smoking status: Never   Smokeless tobacco: Never  Substance Use Topics   Alcohol use: No    Alcohol/week: 0.0 standard drinks of alcohol    Family History  Problem Relation Age of Onset   Kidney failure Mother        from dm and ht  had 11 children    Hyperlipidemia Mother    Diabetes Mother    Thyroid  disease Mother    Diabetes Father    Heart attack Father        dec age 73   Thyroid  disease Sister    Throat cancer Paternal Grandmother        dipped stuff   Esophageal cancer Paternal Grandmother    Colon cancer Neg Hx    Stomach cancer Neg Hx    Breast cancer Neg Hx      Review of Systems  Musculoskeletal:  Positive for arthralgias.  All other systems reviewed and are negative.   Objective:  Physical Exam Constitutional:      General: She is not in acute distress.    Appearance: Normal appearance. She is not ill-appearing.  HENT:      Head: Normocephalic and atraumatic.     Right Ear: External ear normal.     Left Ear: External ear normal.     Nose: Nose normal.     Mouth/Throat:     Mouth: Mucous membranes are moist.     Pharynx: Oropharynx is clear.  Eyes:     Extraocular Movements: Extraocular movements intact.     Conjunctiva/sclera: Conjunctivae normal.  Cardiovascular:     Rate and Rhythm: Normal rate.     Pulses: Normal pulses.  Pulmonary:     Effort: Pulmonary effort  is normal.  Abdominal:     General: Bowel sounds are normal.     Palpations: Abdomen is soft.  Musculoskeletal:        General: Tenderness present.     Cervical back: Normal range of motion and neck supple.     Comments: TTP over groin, lateral aspect, greater trochanter.  Mild IT band tenderness.  No significant swelling.  No overlying lesions of area of chief complaint.  Decreased strength and ROM due to elicited pain.  Dorsiflexion and plantarflexion intact.  BLE appear grossly neurovascularly intact.  Gait mildly antalgic.   Skin:    General: Skin is warm and dry.  Neurological:     Mental Status: She is alert and oriented to person, place, and time. Mental status is at baseline.  Psychiatric:        Mood and Affect: Mood normal.        Behavior: Behavior normal.     Vital signs in last 24 hours: @VSRANGES @  Labs:   Estimated body mass index is 33.14 kg/m as calculated from the following:   Height as of 07/30/24: 5' 5.5 (1.664 m).   Weight as of 07/30/24: 91.7 kg.   Imaging Review Plain radiographs demonstrate severe degenerative joint disease of the left hip(s). The bone quality appears to be fair for age and reported activity level.      Assessment/Plan:  End stage arthritis, left hip(s)  The patient history, physical examination, clinical judgement of the provider and imaging studies are consistent with end stage degenerative joint disease of the left hip(s) and total hip arthroplasty is deemed medically  necessary. The treatment options including medical management, injection therapy, arthroscopy and arthroplasty were discussed at length. The risks and benefits of total hip arthroplasty were presented and reviewed. The risks due to aseptic loosening, infection, stiffness, dislocation/subluxation,  thromboembolic complications and other imponderables were discussed.  The patient acknowledged the explanation, agreed to proceed with the plan and consent was signed. Patient is being admitted for inpatient treatment for surgery, pain control, PT, OT, prophylactic antibiotics, VTE prophylaxis, progressive ambulation and ADL's and discharge planning.The patient is planning to be discharged home with OPPT    Patient's anticipated LOS is less than 2 midnights, meeting these requirements: - Younger than 71 - Lives within 1 hour of care - Has a competent adult at home to recover with post-op recover - NO history of  - Chronic pain requiring opiods  - Coronary Artery Disease  - Heart failure  - Heart attack  - Stroke  - DVT/VTE  - Cardiac arrhythmia  - Respiratory Failure/COPD  - Renal failure  - Advanced Liver disease

## 2024-09-09 NOTE — Patient Instructions (Signed)
 SURGICAL WAITING ROOM VISITATION  Patients having surgery or a procedure may have no more than 2 support people in the waiting area - these visitors may rotate.    Children under the age of 23 must have an adult with them who is not the patient.  Visitors with respiratory illnesses are discouraged from visiting and should remain at home.  If the patient needs to stay at the hospital during part of their recovery, the visitor guidelines for inpatient rooms apply. Pre-op nurse will coordinate an appropriate time for 1 support person to accompany patient in pre-op.  This support person may not rotate.    Please refer to the [facility] website for the visitor guidelines for Inpatients (after your surgery is over and you are in a regular room).       Your procedure is scheduled on: 09-21-24    Report to [facility] Main Entrance    Report to admitting at        0730  AM   Call this number if you have problems the morning of surgery [phone]   Do not eat food :After Midnight.   After Midnight you may have the following liquids until __0700 ____ AM/  DAY OF SURGERY   then nothing by mouth  Water  Non-Citrus Juices (without pulp, NO RED-Apple, White grape, White cranberry) Black Coffee (NO MILK/CREAM OR CREAMERS, sugar ok)  Clear Tea (NO MILK/CREAM OR CREAMERS, sugar ok) regular and decaf                             Plain Jell-O (NO RED)                                           Fruit ices (not with fruit pulp, NO RED)                                     Popsicles (NO RED)                                                               Sports drinks like Gatorade (NO RED)                    The day of surgery:  Drink ONE (1) Pre-Surgery  G2 BY      0700  AM the morning of surgery. Drink in one sitting. Do not sip.  This drink was given to you during your hospital  pre-op appointment visit. Nothing else to drink after completing the  Pre-Surgery  G2.          If you  have questions, please contact your surgeon's office.   FOLLOW BOWEL PREP AND ANY ADDITIONAL PRE OP INSTRUCTIONS YOU RECEIVED FROM YOUR SURGEON'S OFFICE!!!     Oral Hygiene is also important to reduce your risk of infection.  Remember - BRUSH YOUR TEETH THE MORNING OF SURGERY WITH YOUR REGULAR TOOTHPASTE  DENTURES WILL BE REMOVED PRIOR TO SURGERY PLEASE DO NOT APPLY Poly grip OR ADHESIVES!!!   Do NOT smoke after Midnight   Stop all vitamins and herbal supplements 7 days before surgery.   Take these medicines the morning of surgery with A SIP OF WATER : levothyroxine   No ozempic  one week before surgery  DO NOT TAKE ANY ORAL DIABETIC MEDICATIONS DAY OF YOUR SURGERY  Bring CPAP mask and tubing day of surgery.                              You may not have any metal on your body including hair pins, jewelry, and body piercing             Do not wear make-up, lotions, powders, perfumes/cologne, or deodorant  Do not wear nail polish including gel and S&S, artificial/acrylic nails, or any other type of covering on natural nails including finger and toenails. If you have artificial nails, gel coating, etc. that needs to be removed by a nail salon please have this removed prior to surgery or surgery may need to be canceled/ delayed if the surgeon/ anesthesia feels like they are unable to be safely monitored.   Do not shave 4 days prior to surgery        .   Do not bring valuables to the hospital. [facility] IS NOT             RESPONSIBLE   FOR VALUABLES.   Contacts, glasses, dentures or bridgework may not be worn into surgery.   Bring small overnight bag day of surgery.   DO NOT BRING YOUR HOME MEDICATIONS TO THE HOSPITAL. PHARMACY WILL DISPENSE MEDICATIONS LISTED ON YOUR MEDICATION LIST TO YOU DURING YOUR ADMISSION IN THE HOSPITAL!    Patients discharged on the day of surgery will not be allowed to drive home.  Someone NEEDS to stay with you for  the first 24 hours after anesthesia.   Special Instructions: Bring a copy of your healthcare power of attorney and living will documents the day of surgery if you haven't scanned them before.              Please read over the following fact sheets you were given: IF YOU HAVE QUESTIONS ABOUT YOUR PRE-OP INSTRUCTIONS PLEASE CALL [phone].   . If you test positive for Covid or have been in contact with anyone that has tested positive in the last 10 days please notify you surgeon.      Pre-operative 4 CHG Bath Instructions  DYNA-Hex 4 Chlorhexidine  Gluconate 4% Solution Antiseptic 4 fl. oz   You can play a key role in reducing the risk of infection after surgery. Your skin needs to be as free of germs as possible. You can reduce the number of germs on your skin by washing with CHG (chlorhexidine  gluconate) soap before surgery. CHG is an antiseptic soap that kills germs and continues to kill germs even after washing.   DO NOT use if you have an allergy to chlorhexidine /CHG or antibacterial soaps. If your skin becomes reddened or irritated, stop using the CHG and notify one of our RNs at   Please shower with the CHG soap starting 4 days before surgery using the following schedule:     Please keep in mind the following:  DO NOT shave, including legs and underarms, starting the  day of your first shower.   You may shave your face at any point before/day of surgery.  Place clean sheets on your bed the day you start using CHG soap. Use a clean washcloth (not used since being washed) for each shower. DO NOT sleep with pets once you start using the CHG.  CHG Shower Instructions:  If you choose to wash your hair and private area, wash first with your normal shampoo/soap.  After you use shampoo/soap, rinse your hair and body thoroughly to remove shampoo/soap residue.  Turn the water  OFF and apply about 3 tablespoons (45 ml) of CHG soap to a CLEAN washcloth.  Apply CHG soap ONLY FROM YOUR NECK DOWN  TO YOUR TOES (washing for 3-5 minutes)  DO NOT use CHG soap on face, private areas, open wounds, or sores.  Pay special attention to the area where your surgery is being performed.  If you are having back surgery, having someone wash your back for you may be helpful. Wait 2 minutes after CHG soap is applied, then you may rinse off the CHG soap.  Pat dry with a clean towel  Put on clean clothes/pajamas   If you choose to wear lotion, please use ONLY the CHG-compatible lotions on the back of this paper.     Additional instructions for the day of surgery: DO NOT APPLY any lotions, deodorants, cologne, or perfumes.   Put on clean/comfortable clothes.  Brush your teeth.  Ask your nurse before applying any prescription medications to the skin.   CHG Compatible Lotions   Aveeno Moisturizing lotion  Cetaphil Moisturizing Cream  Cetaphil Moisturizing Lotion  Clairol Herbal Essence Moisturizing Lotion, Dry Skin  Clairol Herbal Essence Moisturizing Lotion, Extra Dry Skin  Clairol Herbal Essence Moisturizing Lotion, Normal Skin  Curel Age Defying Therapeutic Moisturizing Lotion with Alpha Hydroxy  Curel Extreme Care Body Lotion  Curel Soothing Hands Moisturizing Hand Lotion  Curel Therapeutic Moisturizing Cream, Fragrance-Free  Curel Therapeutic Moisturizing Lotion, Fragrance-Free  Curel Therapeutic Moisturizing Lotion, Original Formula  Eucerin Daily Replenishing Lotion  Eucerin Dry Skin Therapy Plus Alpha Hydroxy Crme  Eucerin Dry Skin Therapy Plus Alpha Hydroxy Lotion  Eucerin Original Crme  Eucerin Original Lotion  Eucerin Plus Crme Eucerin Plus Lotion  Eucerin TriLipid Replenishing Lotion  Keri Anti-Bacterial Hand Lotion  Keri Deep Conditioning Original Lotion Dry Skin Formula Softly Scented  Keri Deep Conditioning Original Lotion, Fragrance Free Sensitive Skin Formula  Keri Lotion Fast Absorbing Fragrance Free Sensitive Skin Formula  Keri Lotion Fast Absorbing Softly Scented Dry  Skin Formula  Keri Original Lotion  Keri Skin Renewal Lotion Keri Silky Smooth Lotion  Keri Silky Smooth Sensitive Skin Lotion  Nivea Body Creamy Conditioning Oil  Nivea Body Extra Enriched Teacher, Adult Education Moisturizing Lotion Nivea Crme  Nivea Skin Firming Lotion  NutraDerm 30 Skin Lotion  NutraDerm Skin Lotion  NutraDerm Therapeutic Skin Cream  NutraDerm Therapeutic Skin Lotion  ProShield Protective Hand Cream  Provon moisturizing lotion WHAT IS A BLOOD TRANSFUSION? Blood Transfusion Information  A transfusion is the replacement of blood or some of its parts. Blood is made up of multiple cells which provide different functions. Red blood cells carry oxygen  and are used for blood loss replacement. White blood cells fight against infection. Platelets control bleeding. Plasma helps clot blood. Other blood products are available for specialized needs, such as hemophilia or other clotting disorders. BEFORE THE TRANSFUSION  Who gives blood for transfusions?  Healthy volunteers who  are fully evaluated to make sure their blood is safe. This is blood bank blood. Transfusion therapy is the safest it has ever been in the practice of medicine. Before blood is taken from a donor, a complete history is taken to make sure that person has no history of diseases nor engages in risky social behavior (examples are intravenous drug use or sexual activity with multiple partners). The donor's travel history is screened to minimize risk of transmitting infections, such as malaria. The donated blood is tested for signs of infectious diseases, such as HIV and hepatitis. The blood is then tested to be sure it is compatible with you in order to minimize the chance of a transfusion reaction. If you or a relative donates blood, this is often done in anticipation of surgery and is not appropriate for emergency situations. It takes many days to process the donated blood. RISKS AND  COMPLICATIONS Although transfusion therapy is very safe and saves many lives, the main dangers of transfusion include:  Getting an infectious disease. Developing a transfusion reaction. This is an allergic reaction to something in the blood you were given. Every precaution is taken to prevent this. The decision to have a blood transfusion has been considered carefully by your caregiver before blood is given. Blood is not given unless the benefits outweigh the risks. AFTER THE TRANSFUSION Right after receiving a blood transfusion, you will usually feel much better and more energetic. This is especially true if your red blood cells have gotten low (anemic). The transfusion raises the level of the red blood cells which carry oxygen , and this usually causes an energy increase. The nurse administering the transfusion will monitor you carefully for complications. HOME CARE INSTRUCTIONS  No special instructions are needed after a transfusion. You may find your energy is better. Speak with your caregiver about any limitations on activity for underlying diseases you may have. SEEK MEDICAL CARE IF:  Your condition is not improving after your transfusion. You develop redness or irritation at the intravenous (IV) site. SEEK IMMEDIATE MEDICAL CARE IF:  Any of the following symptoms occur over the next 12 hours: Shaking chills. You have a temperature by mouth above 102 F (38.9 C), not controlled by medicine. Chest, back, or muscle pain. People around you feel you are not acting correctly or are confused. Shortness of breath or difficulty breathing. Dizziness and fainting. You get a rash or develop hives. You have a decrease in urine output. Your urine turns a dark color or changes to pink, red, or brown. Any of the following symptoms occur over the next 10 days: You have a temperature by mouth above 102 F (38.9 C), not controlled by medicine. Shortness of breath. Weakness after normal activity. The  white part of the eye turns yellow (jaundice). You have a decrease in the amount of urine or are urinating less often. Your urine turns a dark color or changes to pink, red, or brown. Document Released: 09/28/2000 Document Revised: 12/24/2011 Document Reviewed: 05/17/2008 Exit[employer] Patient Information 2014 [place], [place].  _______________________________________________________________________  Incentive Spirometer  An incentive spirometer is a tool that can help keep your lungs clear and active. This tool measures how well you are filling your lungs with each breath. Taking long deep breaths may help reverse or decrease the chance of developing breathing (pulmonary) problems (especially infection) following: A long period of time when you are unable to move or be active. BEFORE THE PROCEDURE  If the spirometer includes an indicator to show your  best effort, your nurse or respiratory therapist will set it to a desired goal. If possible, sit up straight or lean slightly forward. Try not to slouch. Hold the incentive spirometer in an upright position. INSTRUCTIONS FOR USE  Sit on the edge of your bed if possible, or sit up as far as you can in bed or on a chair. Hold the incentive spirometer in an upright position. Breathe out normally. Place the mouthpiece in your mouth and seal your lips tightly around it. Breathe in slowly and as deeply as possible, raising the piston or the ball toward the top of the column. Hold your breath for 3-5 seconds or for as long as possible. Allow the piston or ball to fall to the bottom of the column. Remove the mouthpiece from your mouth and breathe out normally. Rest for a few seconds and repeat Steps 1 through 7 at least 10 times every 1-2 hours when you are awake. Take your time and take a few normal breaths between deep breaths. The spirometer may include an indicator to show your best effort. Use the indicator as a goal to work toward during each  repetition. After each set of 10 deep breaths, practice coughing to be sure your lungs are clear. If you have an incision (the cut made at the time of surgery), support your incision when coughing by placing a pillow or rolled up towels firmly against it. Once you are able to get out of bed, walk around indoors and cough well. You may stop using the incentive spirometer when instructed by your caregiver.  RISKS AND COMPLICATIONS Take your time so you do not get dizzy or light-headed. If you are in pain, you may need to take or ask for pain medication before doing incentive spirometry. It is harder to take a deep breath if you are having pain. AFTER USE Rest and breathe slowly and easily. It can be helpful to keep track of a log of your progress. Your caregiver can provide you with a simple table to help with this. If you are using the spirometer at home, follow these instructions: SEEK MEDICAL CARE IF:  You are having difficultly using the spirometer. You have trouble using the spirometer as often as instructed. Your pain medication is not giving enough relief while using the spirometer. You develop fever of 100.5 F (38.1 C) or higher. SEEK IMMEDIATE MEDICAL CARE IF:  You cough up bloody sputum that had not been present before. You develop fever of 102 F (38.9 C) or greater. You develop worsening pain at or near the incision site. MAKE SURE YOU:  Understand these instructions. Will watch your condition. Will get help right away if you are not doing well or get worse. Document Released: 02/11/2007 Document Revised: 12/24/2011 Document Reviewed: 04/14/2007 [facility] Patient Information 2014 [place], [place].   ________________________________________________________________________

## 2024-09-09 NOTE — Progress Notes (Addendum)
 PCP - Dr.  Apolinar Eastern Cardiologist - n/a  PPM/ICD -  Device Orders -  Rep Notified -   Chest x-ray -  EKG -  Stress Test -  ECHO -  Cardiac Cath -   Sleep Study - yes CPAP - yes  Fasting Blood Sugar -  Checks Blood Sugar _____ times a day  Blood Thinner Instructions:n/a Aspirin  Instructions:n/a  ERAS Protcol - PRE-SURGERY G2-   OZEMPIC - last dose 09-11-24 COVID vaccine -  Activity--Able to climb a flight of stairs with no CP or SOB Anesthesia review: T2DM,OSA  Patient denies shortness of breath, fever, cough and chest pain at PAT appointment   All instructions explained to the patient, with a verbal understanding of the material. Patient agrees to go over the instructions while at home for a better understanding. Patient also instructed to self quarantine after being tested for COVID-19. The opportunity to ask questions was provided.

## 2024-09-14 ENCOUNTER — Other Ambulatory Visit: Payer: Self-pay

## 2024-09-14 ENCOUNTER — Encounter (HOSPITAL_COMMUNITY)
Admission: RE | Admit: 2024-09-14 | Discharge: 2024-09-14 | Disposition: A | Source: Ambulatory Visit | Attending: Orthopedic Surgery | Admitting: Orthopedic Surgery

## 2024-09-14 ENCOUNTER — Encounter (HOSPITAL_COMMUNITY): Payer: Self-pay

## 2024-09-14 VITALS — BP 110/74 | HR 79 | Temp 97.9°F | Resp 16 | Ht 66.5 in | Wt 200.0 lb

## 2024-09-14 DIAGNOSIS — Z01812 Encounter for preprocedural laboratory examination: Secondary | ICD-10-CM | POA: Insufficient documentation

## 2024-09-14 DIAGNOSIS — E119 Type 2 diabetes mellitus without complications: Secondary | ICD-10-CM | POA: Diagnosis not present

## 2024-09-14 DIAGNOSIS — Z01818 Encounter for other preprocedural examination: Secondary | ICD-10-CM

## 2024-09-14 DIAGNOSIS — G8929 Other chronic pain: Secondary | ICD-10-CM | POA: Diagnosis not present

## 2024-09-14 DIAGNOSIS — M25552 Pain in left hip: Secondary | ICD-10-CM | POA: Insufficient documentation

## 2024-09-14 DIAGNOSIS — E162 Hypoglycemia, unspecified: Secondary | ICD-10-CM

## 2024-09-14 LAB — COMPREHENSIVE METABOLIC PANEL WITH GFR
ALT: 14 U/L (ref 0–44)
AST: 26 U/L (ref 15–41)
Albumin: 4.2 g/dL (ref 3.5–5.0)
Alkaline Phosphatase: 146 U/L — ABNORMAL HIGH (ref 38–126)
Anion gap: 10 (ref 5–15)
BUN: 15 mg/dL (ref 8–23)
CO2: 23 mmol/L (ref 22–32)
Calcium: 9.5 mg/dL (ref 8.9–10.3)
Chloride: 105 mmol/L (ref 98–111)
Creatinine, Ser: 0.8 mg/dL (ref 0.44–1.00)
GFR, Estimated: >60 mL/min (ref >60)
Glucose, Bld: 116 mg/dL — ABNORMAL HIGH (ref 70–99)
Potassium: 4.1 mmol/L (ref 3.5–5.1)
Sodium: 138 mmol/L (ref 135–145)
Total Bilirubin: 0.6 mg/dL (ref 0.0–1.2)
Total Protein: 7.3 g/dL (ref 6.5–8.1)

## 2024-09-14 LAB — CBC WITH DIFFERENTIAL/PLATELET
Abs Immature Granulocytes: 0.02 K/uL (ref 0.00–0.07)
Basophils Absolute: 0 K/uL (ref 0.0–0.1)
Basophils Relative: 0 %
Eosinophils Absolute: 0.1 K/uL (ref 0.0–0.5)
Eosinophils Relative: 2 %
HCT: 40.1 % (ref 36.0–46.0)
Hemoglobin: 13.1 g/dL (ref 12.0–15.0)
Immature Granulocytes: 0 %
Lymphocytes Relative: 26 %
Lymphs Abs: 1.2 K/uL (ref 0.7–4.0)
MCH: 29.7 pg (ref 26.0–34.0)
MCHC: 32.7 g/dL (ref 30.0–36.0)
MCV: 90.9 fL (ref 80.0–100.0)
Monocytes Absolute: 0.3 K/uL (ref 0.1–1.0)
Monocytes Relative: 6 %
Neutro Abs: 3.1 K/uL (ref 1.7–7.7)
Neutrophils Relative %: 66 %
Platelets: 199 K/uL (ref 150–400)
RBC: 4.41 MIL/uL (ref 3.87–5.11)
RDW: 12.4 % (ref 11.5–15.5)
WBC: 4.8 K/uL (ref 4.0–10.5)
nRBC: 0 % (ref 0.0–0.2)

## 2024-09-14 LAB — GLUCOSE, CAPILLARY: Glucose-Capillary: 124 mg/dL — ABNORMAL HIGH (ref 70–99)

## 2024-09-14 LAB — SURGICAL PCR SCREEN
MRSA, PCR: NEGATIVE
Staphylococcus aureus: NEGATIVE

## 2024-09-15 LAB — HEMOGLOBIN A1C
Hgb A1c MFr Bld: 5.8 % — ABNORMAL HIGH (ref 4.8–5.6)
Mean Plasma Glucose: 120 mg/dL

## 2024-09-21 ENCOUNTER — Encounter (HOSPITAL_COMMUNITY): Admission: RE | Disposition: A | Payer: Self-pay | Source: Ambulatory Visit | Attending: Orthopedic Surgery

## 2024-09-21 ENCOUNTER — Ambulatory Visit (HOSPITAL_COMMUNITY)

## 2024-09-21 ENCOUNTER — Encounter (HOSPITAL_COMMUNITY): Payer: Self-pay | Admitting: Medical

## 2024-09-21 ENCOUNTER — Other Ambulatory Visit: Payer: Self-pay

## 2024-09-21 ENCOUNTER — Ambulatory Visit (HOSPITAL_COMMUNITY)
Admission: RE | Admit: 2024-09-21 | Discharge: 2024-09-21 | Disposition: A | Source: Ambulatory Visit | Attending: Orthopedic Surgery | Admitting: Orthopedic Surgery

## 2024-09-21 ENCOUNTER — Encounter (HOSPITAL_COMMUNITY): Payer: Self-pay | Admitting: Orthopedic Surgery

## 2024-09-21 ENCOUNTER — Ambulatory Visit (HOSPITAL_COMMUNITY): Admitting: Certified Registered"

## 2024-09-21 DIAGNOSIS — G4733 Obstructive sleep apnea (adult) (pediatric): Secondary | ICD-10-CM | POA: Diagnosis not present

## 2024-09-21 DIAGNOSIS — N261 Atrophy of kidney (terminal): Secondary | ICD-10-CM | POA: Diagnosis not present

## 2024-09-21 DIAGNOSIS — K219 Gastro-esophageal reflux disease without esophagitis: Secondary | ICD-10-CM | POA: Diagnosis not present

## 2024-09-21 DIAGNOSIS — Z7985 Long-term (current) use of injectable non-insulin antidiabetic drugs: Secondary | ICD-10-CM | POA: Diagnosis not present

## 2024-09-21 DIAGNOSIS — Z6831 Body mass index (BMI) 31.0-31.9, adult: Secondary | ICD-10-CM | POA: Diagnosis not present

## 2024-09-21 DIAGNOSIS — E162 Hypoglycemia, unspecified: Secondary | ICD-10-CM

## 2024-09-21 DIAGNOSIS — Z8711 Personal history of peptic ulcer disease: Secondary | ICD-10-CM | POA: Diagnosis not present

## 2024-09-21 DIAGNOSIS — E039 Hypothyroidism, unspecified: Secondary | ICD-10-CM | POA: Diagnosis not present

## 2024-09-21 DIAGNOSIS — K449 Diaphragmatic hernia without obstruction or gangrene: Secondary | ICD-10-CM | POA: Diagnosis not present

## 2024-09-21 DIAGNOSIS — E669 Obesity, unspecified: Secondary | ICD-10-CM | POA: Diagnosis not present

## 2024-09-21 DIAGNOSIS — E119 Type 2 diabetes mellitus without complications: Secondary | ICD-10-CM | POA: Diagnosis not present

## 2024-09-21 HISTORY — PX: TOTAL HIP ARTHROPLASTY: SHX124

## 2024-09-21 LAB — TYPE AND SCREEN
ABO/RH(D): A POS
Antibody Screen: NEGATIVE

## 2024-09-21 LAB — GLUCOSE, CAPILLARY
Glucose-Capillary: 119 mg/dL — ABNORMAL HIGH (ref 70–99)
Glucose-Capillary: 143 mg/dL — ABNORMAL HIGH (ref 70–99)
Glucose-Capillary: 126 mg/dL — ABNORMAL HIGH (ref 70–99)

## 2024-09-21 SURGERY — ARTHROPLASTY, HIP, TOTAL,POSTERIOR APPROACH
Anesthesia: Spinal | Site: Hip | Laterality: Left

## 2024-09-21 MED ORDER — CEFAZOLIN SODIUM-DEXTROSE 2-4 GM/100ML-% IV SOLN
2.0000 g | INTRAVENOUS | Status: AC
  Administered 2024-09-21: 2 g via INTRAVENOUS
  Filled 2024-09-21: qty 100

## 2024-09-21 MED ORDER — SODIUM CHLORIDE (PF) 0.9 % IJ SOLN
INTRAMUSCULAR | Status: AC
  Filled 2024-09-21: qty 30

## 2024-09-21 MED ORDER — VANCOMYCIN HCL IN DEXTROSE 1-5 GM/200ML-% IV SOLN
1000.0000 mg | INTRAVENOUS | Status: AC
  Administered 2024-09-21: 1000 mg via INTRAVENOUS
  Filled 2024-09-21: qty 200

## 2024-09-21 MED ORDER — SODIUM CHLORIDE (PF) 0.9 % IJ SOLN
INTRAMUSCULAR | Status: DC | PRN
  Administered 2024-09-21: 80 mL

## 2024-09-21 MED ORDER — ONDANSETRON HCL 4 MG PO TABS: 4.0000 mg | ORAL_TABLET | Freq: Four times a day (QID) | ORAL | Status: DC | PRN

## 2024-09-21 MED ORDER — METHOCARBAMOL 500 MG PO TABS: 500.0000 mg | ORAL_TABLET | Freq: Three times a day (TID) | ORAL | 0 refills | Status: AC | PRN

## 2024-09-21 MED ORDER — LACTATED RINGERS IV SOLN: INTRAVENOUS | Status: DC

## 2024-09-21 MED ORDER — CEFAZOLIN SODIUM-DEXTROSE 2-4 GM/100ML-% IV SOLN: 2.0000 g | Freq: Four times a day (QID) | INTRAVENOUS | Status: DC

## 2024-09-21 MED ORDER — AMISULPRIDE (ANTIEMETIC) 5 MG/2ML IV SOLN
10.0000 mg | Freq: Once | INTRAVENOUS | Status: AC | PRN
  Administered 2024-09-21: 10 mg via INTRAVENOUS

## 2024-09-21 MED ORDER — BUPIVACAINE LIPOSOME 1.3 % IJ SUSP: 10.0000 mL | Freq: Once | INTRAMUSCULAR | Status: DC

## 2024-09-21 MED ORDER — ACETAMINOPHEN 500 MG PO TABS
1000.0000 mg | ORAL_TABLET | Freq: Four times a day (QID) | ORAL | Status: DC
  Administered 2024-09-21: 1000 mg via ORAL

## 2024-09-21 MED ORDER — ASPIRIN 81 MG PO TBEC: 81.0000 mg | DELAYED_RELEASE_TABLET | Freq: Two times a day (BID) | ORAL | Status: AC

## 2024-09-21 MED ORDER — LACTATED RINGERS IV BOLUS: 250.0000 mL | Freq: Once | INTRAVENOUS | Status: DC

## 2024-09-21 MED ORDER — FENTANYL CITRATE (PF) 50 MCG/ML IJ SOSY
25.0000 ug | PREFILLED_SYRINGE | INTRAMUSCULAR | Status: DC | PRN
  Administered 2024-09-21 (×3): 50 ug via INTRAVENOUS

## 2024-09-21 MED ORDER — OXYCODONE HCL 5 MG PO TABS
ORAL_TABLET | ORAL | Status: AC
  Filled 2024-09-21: qty 1

## 2024-09-21 MED ORDER — METHOCARBAMOL 1000 MG/10ML IJ SOLN
500.0000 mg | Freq: Four times a day (QID) | INTRAMUSCULAR | Status: DC | PRN
  Administered 2024-09-21: 500 mg via INTRAVENOUS

## 2024-09-21 MED ORDER — ACETAMINOPHEN 500 MG PO TABS: 1000.0000 mg | ORAL_TABLET | Freq: Three times a day (TID) | ORAL | Status: AC | PRN

## 2024-09-21 MED ORDER — ISOPROPYL ALCOHOL 70 % SOLN
Status: DC | PRN
  Administered 2024-09-21: 1 via TOPICAL

## 2024-09-21 MED ORDER — OXYCODONE HCL 5 MG PO TABS: 5.0000 mg | ORAL_TABLET | ORAL | 0 refills | Status: AC | PRN

## 2024-09-21 MED ORDER — SCOPOLAMINE 1 MG/3DAYS TD PT72
1.0000 | MEDICATED_PATCH | TRANSDERMAL | Status: DC
  Administered 2024-09-21: 1 mg via TRANSDERMAL

## 2024-09-21 MED ORDER — OXYCODONE HCL 5 MG PO TABS
5.0000 mg | ORAL_TABLET | ORAL | Status: DC | PRN
  Administered 2024-09-21: 5 mg via ORAL

## 2024-09-21 MED ORDER — PHENYLEPHRINE 80 MCG/ML (10ML) SYRINGE FOR IV PUSH (FOR BLOOD PRESSURE SUPPORT)
PREFILLED_SYRINGE | INTRAVENOUS | Status: DC | PRN
  Administered 2024-09-21: 160 ug via INTRAVENOUS

## 2024-09-21 MED ORDER — FENTANYL CITRATE (PF) 50 MCG/ML IJ SOSY
PREFILLED_SYRINGE | INTRAMUSCULAR | Status: AC
  Filled 2024-09-21: qty 2

## 2024-09-21 MED ORDER — DEXAMETHASONE SOD PHOSPHATE PF 10 MG/ML IJ SOLN
4.0000 mg | Freq: Once | INTRAMUSCULAR | Status: AC
  Administered 2024-09-21: 4 mg via INTRAVENOUS

## 2024-09-21 MED ORDER — KETOROLAC TROMETHAMINE 15 MG/ML IJ SOLN
INTRAMUSCULAR | Status: AC
  Filled 2024-09-21: qty 1

## 2024-09-21 MED ORDER — SODIUM CHLORIDE 0.9 % IV SOLN: INTRAVENOUS | Status: DC

## 2024-09-21 MED ORDER — PHENYLEPHRINE HCL-NACL 20-0.9 MG/250ML-% IV SOLN
INTRAVENOUS | Status: DC | PRN
  Administered 2024-09-21: 50 ug/min via INTRAVENOUS

## 2024-09-21 MED ORDER — ACETAMINOPHEN 500 MG PO TABS
1000.0000 mg | ORAL_TABLET | Freq: Once | ORAL | Status: AC
  Administered 2024-09-21: 1000 mg via ORAL
  Filled 2024-09-21: qty 2

## 2024-09-21 MED ORDER — ACETAMINOPHEN 500 MG PO TABS
ORAL_TABLET | ORAL | Status: AC
  Filled 2024-09-21: qty 2

## 2024-09-21 MED ORDER — SCOPOLAMINE 1 MG/3DAYS TD PT72
MEDICATED_PATCH | TRANSDERMAL | Status: AC
  Filled 2024-09-21: qty 1

## 2024-09-21 MED ORDER — ONDANSETRON HCL 4 MG PO TABS: 4.0000 mg | ORAL_TABLET | Freq: Three times a day (TID) | ORAL | 0 refills | Status: AC | PRN

## 2024-09-21 MED ORDER — 0.9 % SODIUM CHLORIDE (POUR BTL) OPTIME
TOPICAL | Status: DC | PRN
  Administered 2024-09-21: 1000 mL

## 2024-09-21 MED ORDER — PROPOFOL 500 MG/50ML IV EMUL
INTRAVENOUS | Status: DC | PRN
  Administered 2024-09-21: 50 ug/kg/min via INTRAVENOUS

## 2024-09-21 MED ORDER — KETOROLAC TROMETHAMINE 15 MG/ML IJ SOLN
15.0000 mg | Freq: Four times a day (QID) | INTRAMUSCULAR | Status: DC
  Administered 2024-09-21: 15 mg via INTRAVENOUS

## 2024-09-21 MED ORDER — METHOCARBAMOL 1000 MG/10ML IJ SOLN
INTRAMUSCULAR | Status: AC
  Filled 2024-09-21: qty 10

## 2024-09-21 MED ORDER — FENTANYL CITRATE (PF) 50 MCG/ML IJ SOSY
PREFILLED_SYRINGE | INTRAMUSCULAR | Status: AC
  Filled 2024-09-21: qty 1

## 2024-09-21 MED ORDER — MIDAZOLAM HCL 2 MG/2ML IJ SOLN
INTRAMUSCULAR | Status: AC
  Filled 2024-09-21: qty 2

## 2024-09-21 MED ORDER — METHOCARBAMOL 500 MG PO TABS: 500.0000 mg | ORAL_TABLET | Freq: Four times a day (QID) | ORAL | Status: DC | PRN

## 2024-09-21 MED ORDER — CHLORHEXIDINE GLUCONATE 0.12 % MT SOLN
15.0000 mL | Freq: Once | OROMUCOSAL | Status: AC
  Administered 2024-09-21: 15 mL via OROMUCOSAL

## 2024-09-21 MED ORDER — ONDANSETRON HCL 4 MG/2ML IJ SOLN: 4.0000 mg | Freq: Four times a day (QID) | INTRAMUSCULAR | Status: DC | PRN

## 2024-09-21 MED ORDER — HYDROMORPHONE HCL 1 MG/ML IJ SOLN: 0.5000 mg | INTRAMUSCULAR | Status: DC | PRN

## 2024-09-21 MED ORDER — LACTATED RINGERS IV BOLUS
500.0000 mL | Freq: Once | INTRAVENOUS | Status: AC
  Administered 2024-09-21: 500 mL via INTRAVENOUS

## 2024-09-21 MED ORDER — ORAL CARE MOUTH RINSE: 15.0000 mL | Freq: Once | OROMUCOSAL | Status: AC

## 2024-09-21 MED ORDER — WATER FOR IRRIGATION, STERILE IR SOLN
Status: DC | PRN
  Administered 2024-09-21: 2000 mL

## 2024-09-21 MED ORDER — SODIUM CHLORIDE 0.9 % IR SOLN
Status: DC | PRN
  Administered 2024-09-21: 3000 mL

## 2024-09-21 MED ORDER — BUPIVACAINE-EPINEPHRINE (PF) 0.25% -1:200000 IJ SOLN
INTRAMUSCULAR | Status: AC
  Filled 2024-09-21: qty 30

## 2024-09-21 MED ORDER — PROPOFOL 10 MG/ML IV BOLUS
INTRAVENOUS | Status: DC | PRN
  Administered 2024-09-21: 20 mg via INTRAVENOUS

## 2024-09-21 MED ORDER — BUPIVACAINE IN DEXTROSE 0.75-8.25 % IT SOLN
INTRATHECAL | Status: DC | PRN
  Administered 2024-09-21: 1.8 mL via INTRATHECAL

## 2024-09-21 MED ORDER — FENTANYL CITRATE (PF) 250 MCG/5ML IJ SOLN
INTRAMUSCULAR | Status: DC | PRN
  Administered 2024-09-21 (×2): 50 ug via INTRAVENOUS

## 2024-09-21 MED ORDER — POVIDONE-IODINE 10 % EX SWAB: 2.0000 | Freq: Once | CUTANEOUS | Status: DC

## 2024-09-21 MED ORDER — ONDANSETRON HCL 4 MG/2ML IJ SOLN
INTRAMUSCULAR | Status: AC
  Filled 2024-09-21: qty 2

## 2024-09-21 MED ORDER — FENTANYL CITRATE (PF) 100 MCG/2ML IJ SOLN
INTRAMUSCULAR | Status: AC
  Filled 2024-09-21: qty 2

## 2024-09-21 MED ORDER — AMISULPRIDE (ANTIEMETIC) 5 MG/2ML IV SOLN
INTRAVENOUS | Status: AC
  Filled 2024-09-21: qty 4

## 2024-09-21 MED ORDER — CELECOXIB 100 MG PO CAPS: 100.0000 mg | ORAL_CAPSULE | Freq: Two times a day (BID) | ORAL | 0 refills | Status: AC

## 2024-09-21 MED ORDER — EPHEDRINE SULFATE-NACL 50-0.9 MG/10ML-% IV SOSY
PREFILLED_SYRINGE | INTRAVENOUS | Status: DC | PRN
  Administered 2024-09-21 (×2): 10 mg via INTRAVENOUS

## 2024-09-21 MED ORDER — ONDANSETRON HCL 4 MG/2ML IJ SOLN
4.0000 mg | Freq: Once | INTRAMUSCULAR | Status: AC | PRN
  Administered 2024-09-21: 4 mg via INTRAVENOUS

## 2024-09-21 MED ORDER — MIDAZOLAM HCL (PF) 2 MG/2ML IJ SOLN
INTRAMUSCULAR | Status: DC | PRN
  Administered 2024-09-21: 2 mg via INTRAVENOUS

## 2024-09-21 MED ORDER — BUPIVACAINE LIPOSOME 1.3 % IJ SUSP
INTRAMUSCULAR | Status: AC
  Filled 2024-09-21: qty 20

## 2024-09-21 MED ORDER — TRANEXAMIC ACID-NACL 1000-0.7 MG/100ML-% IV SOLN
1000.0000 mg | INTRAVENOUS | Status: AC
  Administered 2024-09-21: 1000 mg via INTRAVENOUS
  Filled 2024-09-21: qty 100

## 2024-09-21 SURGICAL SUPPLY — 62 items
BAG COUNTER SPONGE SURGICOUNT (BAG) IMPLANT
BAG ZIPLOCK 12X15 (MISCELLANEOUS) ×1 IMPLANT
BIT DRILL TRIDENT 4X25 SU (BIT) IMPLANT
BLADE SAW SAG 25X90X1.19 (BLADE) ×1 IMPLANT
BRUSH FEMORAL CANAL (MISCELLANEOUS) IMPLANT
CHLORAPREP W/TINT 26 (MISCELLANEOUS) ×2 IMPLANT
CNTNR URN SCR LID CUP LEK RST (MISCELLANEOUS) ×1 IMPLANT
COVER SURGICAL LIGHT HANDLE (MISCELLANEOUS) ×1 IMPLANT
DERMABOND ADVANCED .7 DNX12 (GAUZE/BANDAGES/DRESSINGS) ×2 IMPLANT
DRAPE HIP W/POCKET STRL (MISCELLANEOUS) ×1 IMPLANT
DRAPE INCISE IOBAN 85X60 (DRAPES) ×1 IMPLANT
DRAPE POUCH INSTRU U-SHP 10X18 (DRAPES) ×1 IMPLANT
DRAPE SHEET LG 3/4 BI-LAMINATE (DRAPES) ×3 IMPLANT
DRAPE U-SHAPE 47X51 STRL (DRAPES) ×2 IMPLANT
DRSG AQUACEL AG ADV 3.5X10 (GAUZE/BANDAGES/DRESSINGS) ×1 IMPLANT
ELECT BLADE TIP CTD 4 INCH (ELECTRODE) ×1 IMPLANT
ELECT REM PT RETURN 15FT ADLT (MISCELLANEOUS) ×1 IMPLANT
FACESHIELD WRAPAROUND OR TEAM (MASK) ×1 IMPLANT
GAUZE SPONGE 4X4 12PLY STRL (GAUZE/BANDAGES/DRESSINGS) ×1 IMPLANT
GLOVE BIO SURGEON STRL SZ 6.5 (GLOVE) ×2 IMPLANT
GLOVE BIOGEL PI IND STRL 6.5 (GLOVE) ×1 IMPLANT
GLOVE BIOGEL PI IND STRL 8 (GLOVE) ×1 IMPLANT
GLOVE SURG ORTHO 8.0 STRL STRW (GLOVE) ×2 IMPLANT
GOWN STRL REUS W/ TWL XL LVL3 (GOWN DISPOSABLE) ×2 IMPLANT
HEAD CERAMIC FEMORAL 36MM (Head) IMPLANT
HOLDER FOLEY CATH W/STRAP (MISCELLANEOUS) ×1 IMPLANT
HOOD PEEL AWAY T7 (MISCELLANEOUS) ×3 IMPLANT
INSERT TRIDENT POLY 36 0DEG (Insert) IMPLANT
KIT BASIN OR (CUSTOM PROCEDURE TRAY) ×1 IMPLANT
KIT TURNOVER KIT A (KITS) ×1 IMPLANT
MANIFOLD NEPTUNE II (INSTRUMENTS) ×1 IMPLANT
MARKER SKIN DUAL TIP RULER LAB (MISCELLANEOUS) ×1 IMPLANT
NDL SAFETY ECLIPSE 18X1.5 (NEEDLE) ×2 IMPLANT
PACK TOTAL JOINT (CUSTOM PROCEDURE TRAY) ×1 IMPLANT
PAD ARMBOARD POSITIONER FOAM (MISCELLANEOUS) ×1 IMPLANT
PENCIL SMOKE EVACUATOR (MISCELLANEOUS) ×1 IMPLANT
PRESSURIZER FEMORAL UNIV (MISCELLANEOUS) IMPLANT
PROTECTOR NERVE ULNAR (MISCELLANEOUS) IMPLANT
RETRIEVER SUT HEWSON (MISCELLANEOUS) ×1 IMPLANT
SCREW HEX LP 6.5X20 (Screw) IMPLANT
SEALER BIPOLAR AQUA 6.0 (INSTRUMENTS) IMPLANT
SET HNDPC FAN SPRY TIP SCT (DISPOSABLE) ×1 IMPLANT
SHELL CLUSTERHOLE ACETABULAR 5 (Shell) IMPLANT
SOLUTION IRRIG SURGIPHOR (IV SOLUTION) IMPLANT
SPIKE FLUID TRANSFER (MISCELLANEOUS) ×1 IMPLANT
STEM ACCOLADE II SZ5 (Stem) IMPLANT
SUCTION TUBE FRAZIER 12FR DISP (SUCTIONS) ×1 IMPLANT
SUT BONE WAX W31G (SUTURE) ×1 IMPLANT
SUT ETHIBOND #5 BRAIDED 30INL (SUTURE) ×1 IMPLANT
SUT STRATAFIX 14 PDO 48 VLT (SUTURE) ×1 IMPLANT
SUT VIC AB 2-0 CT2 27 (SUTURE) ×1 IMPLANT
SUTURE STRATFX 0 PDS 27 VIOLET (SUTURE) ×1 IMPLANT
SUTURE STRATFX MNCRL+ 3-0 PS-2 (SUTURE) ×1 IMPLANT
SYR 30ML LL (SYRINGE) ×1 IMPLANT
SYR 50ML LL SCALE MARK (SYRINGE) ×1 IMPLANT
TOWEL GREEN STERILE FF (TOWEL DISPOSABLE) ×1 IMPLANT
TOWEL OR DSP ST BLU DLX 10/PK (DISPOSABLE) ×1 IMPLANT
TOWER CARTRIDGE SMART MIX (DISPOSABLE) IMPLANT
TRAY FOLEY MTR SLVR 16FR STAT (SET/KITS/TRAYS/PACK) IMPLANT
TUBE SUCTION HIGH CAP CLEAR NV (SUCTIONS) ×1 IMPLANT
UNDERPAD 30X36 HEAVY ABSORB (UNDERPADS AND DIAPERS) ×1 IMPLANT
WATER STERILE IRR 1000ML POUR (IV SOLUTION) ×2 IMPLANT

## 2024-09-21 NOTE — Anesthesia Procedure Notes (Addendum)
 Spinal  Patient location during procedure: OR Start time: 09/21/2024 10:43 AM End time: 09/21/2024 10:49 AM Reason for block: surgical anesthesia Staffing Performed: anesthesiologist  Anesthesiologist: Corinne Garnette BRAVO, MD Performed by: Corinne Garnette BRAVO, MD Authorized by: Corinne Garnette BRAVO, MD   Preanesthetic Checklist Completed: patient identified, IV checked, risks and benefits discussed, surgical consent, monitors and equipment checked, pre-op evaluation and timeout performed Spinal Block Patient position: sitting Prep: DuraPrep and site prepped and draped Patient monitoring: continuous pulse ox and blood pressure Approach: midline Location: L3-4 Injection technique: single-shot Needle Needle type: Pencan  Needle gauge: 24 G Assessment Events: CSF return and second provider Additional Notes Functioning IV was confirmed and monitors were applied. Sterile prep and drape, including hand hygiene, mask and sterile gloves were used. The patient was positioned and the spine was prepped. The skin was anesthetized with lidocaine.  Free flow of clear CSF was obtained prior to injecting local anesthetic into the CSF.  The spinal needle aspirated freely following injection.  The needle was carefully withdrawn.  The patient tolerated the procedure well. Consent was obtained prior to procedure with all questions answered and concerns addressed. Risks including but not limited to bleeding, infection, nerve damage, paralysis, failed block, inadequate analgesia, allergic reaction, high spinal, itching and headache were discussed and the patient wished to proceed.   Garnette Corinne, MD

## 2024-09-21 NOTE — Discharge Instructions (Signed)

## 2024-09-21 NOTE — Transfer of Care (Signed)
 Immediate Anesthesia Transfer of Care Note  Patient: Angelica Gray  Procedure(s) Performed: ARTHROPLASTY, HIP, TOTAL,POSTERIOR APPROACH (Left: Hip)  Patient Location: PACU  Anesthesia Type:Spinal and MAC combined with regional for post-op pain  Level of Consciousness: drowsy and patient cooperative  Airway & Oxygen  Therapy: Patient Spontanous Breathing and Patient connected to face mask oxygen   Post-op Assessment: Report given to RN and Post -op Vital signs reviewed and stable  Post vital signs: Reviewed and stable  Last Vitals:  Vitals Value Taken Time  BP    Temp    Pulse 81 09/21/24 12:40  Resp 22 09/21/24 12:40  SpO2 96 % 09/21/24 12:40  Vitals shown include unfiled device data.  Last Pain:  Vitals:   09/21/24 0750  TempSrc:   PainSc: 0-No pain         Complications: No notable events documented.

## 2024-09-21 NOTE — Interval H&P Note (Signed)
The patient has been re-examined, and the chart reviewed, and there have been no interval changes to the documented history and physical.    Plan for L THA for Left hip OA  The operative side was examined and the patient was confirmed to have sensation to DPN, SPN, TN intact, Motor EHL, ext, flex 5/5, and DP 2+, PT 2+, No significant edema.   The risks, benefits, and alternatives have been discussed at length with patient, and the patient is willing to proceed.  Left hip marked. Consent has been signed.

## 2024-09-21 NOTE — Anesthesia Preprocedure Evaluation (Addendum)
 Anesthesia Evaluation  Patient identified by MRN, date of birth, ID band Patient awake    Reviewed: Allergy & Precautions, NPO status , Patient's Chart, lab work & pertinent test results  Airway Mallampati: III  TM Distance: >3 FB Neck ROM: Full    Dental  (+) Dental Advisory Given, Missing   Pulmonary sleep apnea    Pulmonary exam normal breath sounds clear to auscultation       Cardiovascular negative cardio ROS Normal cardiovascular exam Rhythm:Regular Rate:Normal     Neuro/Psych negative neurological ROS     GI/Hepatic Neg liver ROS, hiatal hernia, PUD,GERD  Medicated,,  Endo/Other  diabetes, Type 2Hypothyroidism  Obesity   Renal/GU Renal disease (Left atrophic kidney)     Musculoskeletal  (+) Arthritis ,    Abdominal   Peds  Hematology Plt 199k   Anesthesia Other Findings Day of surgery medications reviewed with the patient.  Reproductive/Obstetrics                              Anesthesia Physical Anesthesia Plan  ASA: 2  Anesthesia Plan: Spinal   Post-op Pain Management: Tylenol  PO (pre-op)*   Induction: Intravenous  PONV Risk Score and Plan: 2 and Midazolam , TIVA, Dexamethasone  and Ondansetron   Airway Management Planned: Natural Airway and Simple Face Mask  Additional Equipment:   Intra-op Plan:   Post-operative Plan:   Informed Consent: I have reviewed the patients History and Physical, chart, labs and discussed the procedure including the risks, benefits and alternatives for the proposed anesthesia with the patient or authorized representative who has indicated his/her understanding and acceptance.     Dental advisory given  Plan Discussed with: CRNA  Anesthesia Plan Comments:          Anesthesia Quick Evaluation

## 2024-09-21 NOTE — Evaluation (Signed)
 Physical Therapy Evaluation Patient Details Name: Angelica Gray MRN: [id number] DOB: 03/31/1961 Today's Date: 09/21/2024  History of Present Illness  63 yo female presents to therapy s/p L THA, posterior lateral approach on 09/21/2024 due to failure of conservative measures. Pt is currently L LE WBAT and no formal hip precautions. Pt PMH includes but is not limited to: goiter, OSA, hernia repair, cameron lesion, anemia, hypoglycemia, RLS, hypothyroidism, HLD, myofascial pain syndrome and R THA, posterior lateral approach on 10/21/2023.  Clinical Impression    GLORIANNA GOTT is a 63 y.o. female POD 0 s/p L THA. Patient reports IND with mobility at baseline. Patient is now limited by functional impairments (see PT problem list below) and requires CGA for transfers and gait with RW. Patient was able to ambulate 45 and 60  feet  with RW and CGA and cues for safe walker management. Patient educated on safe sequencing for stair mobility with one UE support at handrail to emulate wall and HHA, fall risk prevention, use of RW, pain management and goal, use of CP/ice, car transfers and slowly increasing activity pt and spouse verbalized safe guarding position for people assisting with mobility. Patient instructed in exercises to facilitate ROM and circulation reviewed and HO provided. Patient will benefit from continued skilled PT interventions to address impairments and progress towards PLOF. Patient has met mobility goals at adequate level for discharge home with family support and OPPT services. will continue to follow if pt continues acute stay to progress towards Mod I goals.       If plan is discharge home, recommend the following: A little help with walking and/or transfers;A little help with bathing/dressing/bathroom;Assistance with cooking/housework;Assist for transportation;Help with stairs or ramp for entrance   Can travel by private vehicle        Equipment Recommendations None recommended by PT   Recommendations for Other Services       Functional Status Assessment Patient has had a recent decline in their functional status and demonstrates the ability to make significant improvements in function in a reasonable and predictable amount of time.     Precautions / Restrictions Precautions Precautions: Fall Restrictions Weight Bearing Restrictions Per Provider Order: No      Mobility  Bed Mobility Overal bed mobility: Needs Assistance Bed Mobility: Supine to Sit     Supine to sit: Supervision     General bed mobility comments: min cues    Transfers Overall transfer level: Needs assistance Equipment used: Rolling walker (2 wheels) Transfers: Sit to/from Stand Sit to Stand: Contact guard assist           General transfer comment: min cues for RW management with bed, recliner and commode transfers    Ambulation/Gait Ambulation/Gait assistance: Contact guard assist Gait Distance (Feet): 60 Feet Assistive device: Rolling walker (2 wheels) Gait Pattern/deviations: Step-to pattern, Antalgic, Decreased stance time - left, Trunk flexed Gait velocity: decreased     General Gait Details: slight trunk flexion with B UE support at RW to offload L LE in stance phase, min cues for Rw management  Stairs Stairs: Yes Stairs assistance: Contact guard assist Stair Management: Two rails, With walker Number of Stairs: 3 General stair comments: min cues for safety, sequencing and step to technique with pt making attempts to reciprocal pattern, pt naivigated steps with B handrail and then wtih use of RW stating places RW at top of steps and provides enough support in home setting to navigate  Wheelchair Mobility     Tilt  Bed    Modified Rankin (Stroke Patients Only)       Balance Overall balance assessment: Needs assistance Sitting-balance support: Feet supported Sitting balance-Leahy Scale: Good     Standing balance support: Bilateral upper extremity supported,  During functional activity, Reliant on assistive device for balance Standing balance-Leahy Scale: Fair Standing balance comment: static standing no UE support                             Pertinent Vitals/Pain Pain Assessment Pain Assessment: 0-10 Pain Score: 4  Pain Location: L LE and hip Pain Descriptors / Indicators: Aching, Contraction, Discomfort, Dull, Grimacing, Operative site guarding (stiff) Pain Intervention(s): Limited activity within patient's tolerance, Monitored during session, Premedicated before session, Repositioned, Ice applied    Home Living Family/patient expects to be discharged to:: Private residence Living Arrangements: Spouse/significant other;Children Available Help at Discharge: Family Type of Home: House Home Access: Stairs to enter Entrance Stairs-Rails: None Entrance Stairs-Number of Steps: 3   Home Layout: One level Home Equipment: Educational Psychologist (2 wheels);Cane - single point      Prior Function Prior Level of Function : Independent/Modified Independent;Working/employed;Driving             Mobility Comments: IND no AD for all ADLs, self care tasks and IADLs       Extremity/Trunk Assessment        Lower Extremity Assessment Lower Extremity Assessment: LLE deficits/detail LLE Deficits / Details: ankle DF/PF 5?5 LLE Sensation: WNL    Cervical / Trunk Assessment Cervical / Trunk Assessment: Normal  Communication   Communication Communication: No apparent difficulties    Cognition Arousal: Alert Behavior During Therapy: WFL for tasks assessed/performed   PT - Cognitive impairments: No apparent impairments                         Following commands: Intact       Cueing       General Comments General comments (skin integrity, edema, etc.): pt nauseated prior to PT eval and nursing administered medication    Exercises Total Joint Exercises Ankle Circles/Pumps: AROM, Both, 10 reps Quad  Sets: AROM, Left, 5 reps Heel Slides: AROM, Left, 5 reps Hip ABduction/ADduction: AROM, Left, 5 reps, Standing Long Arc Quad: AROM, 5 reps, Left, Seated Knee Flexion: AROM, Left, 5 reps, Standing Marching in Standing: AROM, Left, 5 reps, Standing Standing Hip Extension: AROM, Left, 5 reps, Standing   Assessment/Plan    PT Assessment Patient needs continued PT services  PT Problem List Decreased strength;Decreased range of motion;Decreased activity tolerance;Decreased balance;Decreased mobility;Decreased coordination;Pain       PT Treatment Interventions DME instruction;Gait training;Stair training;Functional mobility training;Therapeutic activities;Therapeutic exercise;Balance training;Neuromuscular re-education;Patient/family education;Modalities    PT Goals (Current goals can be found in the Care Plan section)  Acute Rehab PT Goals Patient Stated Goal: to be able to run a 5k walk/run no pain PT Goal Formulation: With patient Time For Goal Achievement: 10/05/24 Potential to Achieve Goals: Good    Frequency 7X/week     Co-evaluation               AM-PAC PT 6 Clicks Mobility  Outcome Measure Help needed turning from your back to your side while in a flat bed without using bedrails?: None Help needed moving from lying on your back to sitting on the side of a flat bed without using bedrails?: A Little Help needed moving to and  from a bed to a chair (including a wheelchair)?: A Little Help needed standing up from a chair using your arms (e.g., wheelchair or bedside chair)?: A Little Help needed to walk in hospital room?: A Little Help needed climbing 3-5 steps with a railing? : A Little 6 Click Score: 19    End of Session Equipment Utilized During Treatment: Gait belt Activity Tolerance: Patient tolerated treatment well Patient left: in chair;with call bell/phone within reach;with family/visitor present Nurse Communication: Mobility status;Other (comment) (pt readiness  for same day d/c from PT standpoint) PT Visit Diagnosis: Unsteadiness on feet (R26.81);Other abnormalities of gait and mobility (R26.89);Muscle weakness (generalized) (M62.81);Difficulty in walking, not elsewhere classified (R26.2);Pain Pain - Right/Left: Left Pain - part of body: Hip;Leg    Time: 8340-8261 PT Time Calculation (min) (ACUTE ONLY): 39 min   Charges:   PT Evaluation $PT Eval Low Complexity: 1 Low PT Treatments $Gait Training: 8-22 mins $Therapeutic Exercise: 8-22 mins PT General Charges $$ ACUTE PT VISIT: 1 Visit         [place], PT Acute Rehab   [place] VEAR Drone 09/21/2024, 5:50 PM

## 2024-09-21 NOTE — Op Note (Signed)
 09/21/2024  12:09 PM  PATIENT:  Angelica Gray   MRN: [id number]  PRE-OPERATIVE DIAGNOSIS: End-stage left hip osteoarthritis  POST-OPERATIVE DIAGNOSIS:  same  PROCEDURE: Left total hip arthroplasty    PREOPERATIVE INDICATIONS:    Angelica Gray is an 63 y.o. female who has a diagnosis of End-stage left hip osteoarthritis and elected for surgical management after failing conservative treatment.  The risks benefits and alternatives were discussed with the patient including but not limited to the risks of nonoperative treatment, versus surgical intervention including infection, bleeding, nerve injury, periprosthetic fracture, the need for revision surgery, dislocation, leg length discrepancy, blood clots, cardiopulmonary complications, morbidity, mortality, among others, and they were willing to proceed.     OPERATIVE REPORT     SURGEON:  Toribio Higashi, MD    ASSISTANT: Bernarda Mclean, PA-C, (Present throughout the entire procedure,  necessary for completion of procedure in a timely manner, assisting with retraction, instrumentation, and closure)     ANESTHESIA:  spinal  ESTIMATED BLOOD LOSS: 350cc    COMPLICATIONS:  None.      COMPONENTS:  Stryker 52 mm acetabular shell, 6.5 peg screws x 2, X.3 polyethylene liner, Accolade 2 size #5 with 132 degree neck angle, 36+0 mm ceramic head Implant Name Type Inv. Item Serial No. Manufacturer Lot No. LRB No. Used Action  SHELL CLUSTERHOLE ACETABULAR 5 - [id number] Shell SHELL CLUSTERHOLE ACETABULAR 5  [employer] [id number] A Left 1 Implanted  SCREW HEX LP 6.5X20 - [id number] Screw SCREW HEX LP 6.5X20  [employer] NFUE Left 1 Implanted  SCREW HEX LP 6.5X20 - [id number] Screw SCREW HEX LP 6.5X20  [employer] NSK Left 1 Implanted  INSERT TRIDENT POLY 36 0DEG - [id number] Insert INSERT TRIDENT POLY 36 0DEG  [employer] [id number] Left 1 Implanted  STEM ACCOLADE II SZ5 - [id number] Stem STEM ACCOLADE II SZ5  [employer]  [employer] [id number] A Left 1 Implanted  HEAD CERAMIC FEMORAL - [id number] Head HEAD CERAMIC FEMORAL  [employer] [id number] Left 1 Implanted      PROCEDURE IN DETAIL:   The patient was met in the holding area and  identified.  The appropriate hip was identified and marked at the operative site.  The patient was then transported to the OR  and  placed under anesthesia.  At that point, the patient was  placed in the lateral decubitus position with the operative side up and  secured to the operating room table  and all bony prominences padded. A subaxillary role was also placed.    The operative lower extremity was prepped from the iliac crest to the distal leg.  Sterile draping was performed.  Preoperative antibiotics, 2 gm of ancef ,1 gm of Tranexamic Acid , and 8 mg of Decadron  administered. Time out was performed prior to incision.      A routine posterolateral approach was utilized via sharp dissection  carried down to the subcutaneous tissue.  Gross bleeders were Bovie coagulated.  The iliotibial band was identified and incised along the length of the skin incision through the glute max fascia.  Charnley retractor was placed with care to protect the sciatic nerve posteriorly.  With the hip internally rotated, the piriformis tendon was identified and released from the femoral insertion and tagged with a #5 Ethibond.  A capsulotomy was then performed off the femoral insertion and also tagged with a #5 Ethibond.    The femoral neck was exposed, and I resected the femoral neck based on preoperative templating relative to the lesser  trochanter.    I then exposed the deep acetabulum, cleared out any tissue including the ligamentum teres.  After adequate visualization, I excised the labrum.  I then started reaming with a 48 mm reamer, first medializing to the floor of the cotyloid fossa, and then in the position of the cup aiming towards the greater sciatic notch, matching the version of the  transverse acetabular ligament and tucked under the anterior wall. I reamed up to 52 mm reamer with good bony bed preparation and a 52 mm cup was chosen.  The real cup was then impacted into place.  Appropriate version and inclination was confirmed clinically matching their bony anatomy, and also with the use of the jig.  I placed 2 screws in the posterior superior quadrant to augment fixation.  A netural liner was placed and impacted. It was confirmed to be appropriately seated and the acetabular retractors were removed.    I then prepared the proximal femur using the box cutter, Charnley awl, and then sequentially broached starting with 0 up to a size 5.  A trial broach, neck, and head was utilized, and I reduced the hip and it was found to have excellent stability.  There was no impingement with full extension and 90 degrees external rotation.  The hip was stable at the position of sleep and with 90 degrees flexion and 80 degrees of internal rotation.  Leg lengths were also clinically assessed in the lateral position and felt to be equal. Intra-Op flatplate was obtained and confirmed appropriate component positions.  Good fill of the femur with the size 5 broach.  And restoration of leg length and offset. No evidence or concern for fracture.  A final femoral prosthesis size 5 was selected. I then impacted the real femoral prosthesis into place.I again trialed and selected a 36+ 0mm ball. The hip was then reduced and taken through a range of motion. There was no impingement with full extension and 90 degrees external rotation.  The hip was stable at the position of sleep and with 90 degrees flexion and 80 degrees of internal rotation. Leg lengths were  again assessed and felt to be restored.  We then opened, and I impacted the real head ball into place.  The posterior capsule was then closed with #5 Ethibond.  The piriformis was repaired through the base of the abductor tendon using a [place] suture  passer.  I then irrigated the hip copiously with dilute Betadine  and with normal saline pulse lavage. Periarticular injection was then performed with Exparel .   We repaired the fascia #1 barbed suture, followed by 0 barbed suture for the subcutaneous fat.  Skin was closed with 2-0 Vicryl and 3-0 Monocryl.  Dermabond and Aquacel dressing were applied. The patient was then awakened and returned to PACU in stable and satisfactory condition.  Leg lengths in the supine position were assessed and felt to be clinically equal. There were no complications.  Post op recs: WB: WBAT LLE, No formal hip precautions Abx: ancef  Imaging: PACU pelvis Xray Dressing: Aquacell, keep intact until follow up DVT prophylaxis: Aspirin  81BID starting POD1 Follow up: 2 weeks after surgery for a wound check with Dr. Edna at [facility].  Address: [address], [place], [place] [address]  Office Phone: [phone]   Toribio Edna, MD Orthopedic Surgeon

## 2024-09-22 ENCOUNTER — Encounter (HOSPITAL_COMMUNITY): Payer: Self-pay | Admitting: Orthopedic Surgery

## 2024-09-22 NOTE — Anesthesia Postprocedure Evaluation (Signed)
 Anesthesia Post Note  Patient: CAMMIE FAULSTICH  Procedure(s) Performed: ARTHROPLASTY, HIP, TOTAL,POSTERIOR APPROACH (Left: Hip)     Patient location during evaluation: PACU Anesthesia Type: Spinal Level of consciousness: awake, awake and alert and oriented Pain management: pain level controlled Vital Signs Assessment: post-procedure vital signs reviewed and stable Respiratory status: spontaneous breathing, nonlabored ventilation and respiratory function stable Cardiovascular status: blood pressure returned to baseline and stable Postop Assessment: no headache, no backache, spinal receding and no apparent nausea or vomiting Anesthetic complications: no   No notable events documented.  Last Vitals:    Last Pain:                 Garnette FORBES Skillern

## 2024-09-23 ENCOUNTER — Other Ambulatory Visit: Payer: Self-pay | Admitting: Internal Medicine

## 2024-09-23 DIAGNOSIS — E559 Vitamin D deficiency, unspecified: Secondary | ICD-10-CM

## 2024-11-04 ENCOUNTER — Telehealth: Payer: Self-pay

## 2024-11-04 ENCOUNTER — Other Ambulatory Visit (HOSPITAL_COMMUNITY): Payer: Self-pay

## 2024-11-04 NOTE — Telephone Encounter (Signed)
 Pharmacy Patient Advocate Encounter   Received notification from [facility] that prior authorization for Ozempic  8 is required/requested.   Insurance verification completed.   The patient is insured through [facility].   Per test claim: PA required; PA submitted to above mentioned insurance via Latent Key/confirmation #/EOC [id number] Status is pending

## 2024-11-06 ENCOUNTER — Other Ambulatory Visit (HOSPITAL_COMMUNITY): Payer: Self-pay

## 2024-11-13 ENCOUNTER — Other Ambulatory Visit (HOSPITAL_COMMUNITY): Payer: Self-pay

## 2024-11-13 NOTE — Telephone Encounter (Signed)
 Pharmacy Patient Advocate Encounter  Received notification from [facility] that Prior Authorization for Ozempic  8 has been APPROVED from 11/04/24 to 11/04/25. Unable to obtain price due to refill too soon rejection, last fill date 11/09/24 next available fill date2/16/26   PA #/Case ID/Reference #: # [id number]

## 2024-11-15 ENCOUNTER — Other Ambulatory Visit: Payer: Self-pay | Admitting: Internal Medicine

## 2024-11-15 DIAGNOSIS — E559 Vitamin D deficiency, unspecified: Secondary | ICD-10-CM

## 2024-11-17 NOTE — Telephone Encounter (Signed)
 So  I advised at last check to change from  high dose vit d  to  2000 - 4000 international unit per day  and check vit d level  after 2-3 months   I realize  she had surgery in interim  so there may be confusion   Please  contact patient about the above plan acceptability

## 2024-11-18 ENCOUNTER — Telehealth: Payer: Self-pay | Admitting: Internal Medicine

## 2024-11-18 NOTE — Telephone Encounter (Unsigned)
 Copied from CRM #[id number]. Topic: General - Other >> Nov 17, 2024  4:55 PM Alfonso HERO wrote: Reason for CRM: patient calling nurse back. Tried to call CAL no answer. She is asking for a call back.

## 2024-11-18 NOTE — Telephone Encounter (Signed)
 Contacted pt and inform her of provider's advise note. Pt verbalized understanding.

## 2024-11-18 NOTE — Telephone Encounter (Signed)
Spoke to pt. Please see other encounter

## 2024-11-18 NOTE — Telephone Encounter (Signed)
 Reach out to pt. Pt is aware. No further question.

## 2024-12-31 ENCOUNTER — Ambulatory Visit: Admitting: Internal Medicine
